# Patient Record
Sex: Female | Born: 1935 | Hispanic: No | Smoking: Never smoker
Health system: Southern US, Community
[De-identification: ages and names within clinical notes are randomized; demographics above are authoritative.]

## PROBLEM LIST (undated history)

## (undated) DIAGNOSIS — J449 Chronic obstructive pulmonary disease, unspecified: Secondary | ICD-10-CM

## (undated) DIAGNOSIS — F0390 Unspecified dementia without behavioral disturbance: Secondary | ICD-10-CM

## (undated) DIAGNOSIS — M4646 Discitis, unspecified, lumbar region: Secondary | ICD-10-CM

## (undated) DIAGNOSIS — K5900 Constipation, unspecified: Secondary | ICD-10-CM

## (undated) DIAGNOSIS — E871 Hypo-osmolality and hyponatremia: Secondary | ICD-10-CM

## (undated) DIAGNOSIS — K219 Gastro-esophageal reflux disease without esophagitis: Secondary | ICD-10-CM

## (undated) DIAGNOSIS — E039 Hypothyroidism, unspecified: Secondary | ICD-10-CM

## (undated) DIAGNOSIS — I10 Essential (primary) hypertension: Secondary | ICD-10-CM

## (undated) DIAGNOSIS — I509 Heart failure, unspecified: Secondary | ICD-10-CM

## (undated) DIAGNOSIS — R41841 Cognitive communication deficit: Secondary | ICD-10-CM

## (undated) DIAGNOSIS — D649 Anemia, unspecified: Secondary | ICD-10-CM

## (undated) DIAGNOSIS — J309 Allergic rhinitis, unspecified: Secondary | ICD-10-CM

## (undated) DIAGNOSIS — Z86711 Personal history of pulmonary embolism: Secondary | ICD-10-CM

## (undated) DIAGNOSIS — E119 Type 2 diabetes mellitus without complications: Secondary | ICD-10-CM

## (undated) DIAGNOSIS — Z950 Presence of cardiac pacemaker: Secondary | ICD-10-CM

## (undated) DIAGNOSIS — F419 Anxiety disorder, unspecified: Secondary | ICD-10-CM

## (undated) DIAGNOSIS — I5032 Chronic diastolic (congestive) heart failure: Secondary | ICD-10-CM

## (undated) DIAGNOSIS — E1142 Type 2 diabetes mellitus with diabetic polyneuropathy: Secondary | ICD-10-CM

## (undated) HISTORY — DX: Type 2 diabetes mellitus with diabetic polyneuropathy: E11.42

## (undated) HISTORY — DX: Hypo-osmolality and hyponatremia: E87.1

## (undated) HISTORY — DX: Discitis, unspecified, lumbar region: M46.46

## (undated) HISTORY — DX: Personal history of pulmonary embolism: Z86.711

## (undated) HISTORY — DX: Unspecified dementia, unspecified severity, without behavioral disturbance, psychotic disturbance, mood disturbance, and anxiety: F03.90

## (undated) HISTORY — DX: Anemia, unspecified: D64.9

## (undated) HISTORY — DX: Allergic rhinitis, unspecified: J30.9

## (undated) HISTORY — PX: REPLACEMENT TOTAL KNEE BILATERAL: SUR1225

## (undated) HISTORY — DX: Constipation, unspecified: K59.00

## (undated) HISTORY — DX: Cognitive communication deficit: R41.841

## (undated) HISTORY — DX: Anxiety disorder, unspecified: F41.9

## (undated) HISTORY — DX: Chronic diastolic (congestive) heart failure: I50.32

---

## 1998-06-28 ENCOUNTER — Ambulatory Visit (HOSPITAL_COMMUNITY): Admission: RE | Admit: 1998-06-28 | Discharge: 1998-06-28 | Payer: Self-pay | Admitting: Obstetrics & Gynecology

## 2011-05-31 DIAGNOSIS — M549 Dorsalgia, unspecified: Secondary | ICD-10-CM | POA: Diagnosis not present

## 2011-05-31 DIAGNOSIS — N3000 Acute cystitis without hematuria: Secondary | ICD-10-CM | POA: Diagnosis not present

## 2011-05-31 DIAGNOSIS — N390 Urinary tract infection, site not specified: Secondary | ICD-10-CM | POA: Diagnosis not present

## 2011-06-01 DIAGNOSIS — R109 Unspecified abdominal pain: Secondary | ICD-10-CM | POA: Diagnosis not present

## 2011-06-01 DIAGNOSIS — M545 Low back pain: Secondary | ICD-10-CM | POA: Diagnosis not present

## 2011-06-12 DIAGNOSIS — R109 Unspecified abdominal pain: Secondary | ICD-10-CM | POA: Diagnosis not present

## 2011-07-24 DIAGNOSIS — M25559 Pain in unspecified hip: Secondary | ICD-10-CM | POA: Diagnosis not present

## 2011-07-24 DIAGNOSIS — M76899 Other specified enthesopathies of unspecified lower limb, excluding foot: Secondary | ICD-10-CM | POA: Diagnosis not present

## 2011-08-23 DIAGNOSIS — I10 Essential (primary) hypertension: Secondary | ICD-10-CM | POA: Diagnosis not present

## 2011-08-23 DIAGNOSIS — E039 Hypothyroidism, unspecified: Secondary | ICD-10-CM | POA: Diagnosis not present

## 2011-08-23 DIAGNOSIS — N3941 Urge incontinence: Secondary | ICD-10-CM | POA: Diagnosis not present

## 2011-08-23 DIAGNOSIS — Z79899 Other long term (current) drug therapy: Secondary | ICD-10-CM | POA: Diagnosis not present

## 2011-08-23 DIAGNOSIS — E7800 Pure hypercholesterolemia, unspecified: Secondary | ICD-10-CM | POA: Diagnosis not present

## 2011-08-23 DIAGNOSIS — R7301 Impaired fasting glucose: Secondary | ICD-10-CM | POA: Diagnosis not present

## 2011-09-07 DIAGNOSIS — R5381 Other malaise: Secondary | ICD-10-CM | POA: Diagnosis not present

## 2011-09-07 DIAGNOSIS — I959 Hypotension, unspecified: Secondary | ICD-10-CM | POA: Diagnosis not present

## 2011-09-07 DIAGNOSIS — R5383 Other fatigue: Secondary | ICD-10-CM | POA: Diagnosis not present

## 2011-09-21 DIAGNOSIS — I10 Essential (primary) hypertension: Secondary | ICD-10-CM | POA: Diagnosis not present

## 2011-09-21 DIAGNOSIS — IMO0001 Reserved for inherently not codable concepts without codable children: Secondary | ICD-10-CM | POA: Diagnosis not present

## 2011-09-21 DIAGNOSIS — R5381 Other malaise: Secondary | ICD-10-CM | POA: Diagnosis not present

## 2011-10-05 DIAGNOSIS — M2550 Pain in unspecified joint: Secondary | ICD-10-CM | POA: Diagnosis not present

## 2011-10-21 DIAGNOSIS — M25559 Pain in unspecified hip: Secondary | ICD-10-CM | POA: Diagnosis not present

## 2011-11-06 DIAGNOSIS — G609 Hereditary and idiopathic neuropathy, unspecified: Secondary | ICD-10-CM | POA: Diagnosis not present

## 2011-11-06 DIAGNOSIS — M2550 Pain in unspecified joint: Secondary | ICD-10-CM | POA: Diagnosis not present

## 2011-11-13 DIAGNOSIS — R5381 Other malaise: Secondary | ICD-10-CM | POA: Diagnosis not present

## 2011-11-13 DIAGNOSIS — R350 Frequency of micturition: Secondary | ICD-10-CM | POA: Diagnosis not present

## 2011-11-13 DIAGNOSIS — R42 Dizziness and giddiness: Secondary | ICD-10-CM | POA: Diagnosis not present

## 2011-11-13 DIAGNOSIS — I10 Essential (primary) hypertension: Secondary | ICD-10-CM | POA: Diagnosis not present

## 2011-11-17 DIAGNOSIS — E7800 Pure hypercholesterolemia, unspecified: Secondary | ICD-10-CM | POA: Diagnosis not present

## 2011-11-17 DIAGNOSIS — R5381 Other malaise: Secondary | ICD-10-CM | POA: Diagnosis not present

## 2011-11-17 DIAGNOSIS — R51 Headache: Secondary | ICD-10-CM | POA: Diagnosis not present

## 2011-11-17 DIAGNOSIS — R52 Pain, unspecified: Secondary | ICD-10-CM | POA: Diagnosis not present

## 2011-11-17 DIAGNOSIS — Z79899 Other long term (current) drug therapy: Secondary | ICD-10-CM | POA: Diagnosis not present

## 2011-11-17 DIAGNOSIS — I10 Essential (primary) hypertension: Secondary | ICD-10-CM | POA: Diagnosis not present

## 2011-11-20 DIAGNOSIS — R5381 Other malaise: Secondary | ICD-10-CM | POA: Diagnosis not present

## 2011-11-20 DIAGNOSIS — G589 Mononeuropathy, unspecified: Secondary | ICD-10-CM | POA: Diagnosis not present

## 2011-11-20 DIAGNOSIS — R5383 Other fatigue: Secondary | ICD-10-CM | POA: Diagnosis not present

## 2011-11-21 DIAGNOSIS — IMO0001 Reserved for inherently not codable concepts without codable children: Secondary | ICD-10-CM | POA: Diagnosis not present

## 2011-11-21 DIAGNOSIS — R918 Other nonspecific abnormal finding of lung field: Secondary | ICD-10-CM | POA: Diagnosis not present

## 2011-11-21 DIAGNOSIS — D539 Nutritional anemia, unspecified: Secondary | ICD-10-CM | POA: Diagnosis not present

## 2011-11-21 DIAGNOSIS — G5790 Unspecified mononeuropathy of unspecified lower limb: Secondary | ICD-10-CM | POA: Diagnosis not present

## 2011-12-01 DIAGNOSIS — G5790 Unspecified mononeuropathy of unspecified lower limb: Secondary | ICD-10-CM | POA: Diagnosis not present

## 2011-12-15 DIAGNOSIS — F411 Generalized anxiety disorder: Secondary | ICD-10-CM | POA: Diagnosis not present

## 2011-12-15 DIAGNOSIS — G5790 Unspecified mononeuropathy of unspecified lower limb: Secondary | ICD-10-CM | POA: Diagnosis not present

## 2011-12-15 DIAGNOSIS — M1610 Unilateral primary osteoarthritis, unspecified hip: Secondary | ICD-10-CM | POA: Diagnosis not present

## 2012-01-05 DIAGNOSIS — G5790 Unspecified mononeuropathy of unspecified lower limb: Secondary | ICD-10-CM | POA: Diagnosis not present

## 2012-01-05 DIAGNOSIS — M5430 Sciatica, unspecified side: Secondary | ICD-10-CM | POA: Diagnosis not present

## 2012-01-05 DIAGNOSIS — Z23 Encounter for immunization: Secondary | ICD-10-CM | POA: Diagnosis not present

## 2012-01-05 DIAGNOSIS — M1610 Unilateral primary osteoarthritis, unspecified hip: Secondary | ICD-10-CM | POA: Diagnosis not present

## 2012-01-18 DIAGNOSIS — H8110 Benign paroxysmal vertigo, unspecified ear: Secondary | ICD-10-CM | POA: Diagnosis not present

## 2012-01-18 DIAGNOSIS — G5790 Unspecified mononeuropathy of unspecified lower limb: Secondary | ICD-10-CM | POA: Diagnosis not present

## 2012-01-26 DIAGNOSIS — G5790 Unspecified mononeuropathy of unspecified lower limb: Secondary | ICD-10-CM | POA: Diagnosis not present

## 2012-01-30 DIAGNOSIS — Z1231 Encounter for screening mammogram for malignant neoplasm of breast: Secondary | ICD-10-CM | POA: Diagnosis not present

## 2012-02-06 DIAGNOSIS — B079 Viral wart, unspecified: Secondary | ICD-10-CM | POA: Diagnosis not present

## 2012-02-06 DIAGNOSIS — M2550 Pain in unspecified joint: Secondary | ICD-10-CM | POA: Diagnosis not present

## 2012-02-26 DIAGNOSIS — G5790 Unspecified mononeuropathy of unspecified lower limb: Secondary | ICD-10-CM | POA: Diagnosis not present

## 2012-02-26 DIAGNOSIS — R42 Dizziness and giddiness: Secondary | ICD-10-CM | POA: Diagnosis not present

## 2012-03-04 DIAGNOSIS — E7800 Pure hypercholesterolemia, unspecified: Secondary | ICD-10-CM | POA: Diagnosis not present

## 2012-03-04 DIAGNOSIS — R1032 Left lower quadrant pain: Secondary | ICD-10-CM | POA: Diagnosis not present

## 2012-03-04 DIAGNOSIS — K4090 Unilateral inguinal hernia, without obstruction or gangrene, not specified as recurrent: Secondary | ICD-10-CM | POA: Diagnosis not present

## 2012-03-04 DIAGNOSIS — I10 Essential (primary) hypertension: Secondary | ICD-10-CM | POA: Diagnosis not present

## 2012-03-04 DIAGNOSIS — N390 Urinary tract infection, site not specified: Secondary | ICD-10-CM | POA: Diagnosis not present

## 2012-03-04 DIAGNOSIS — A499 Bacterial infection, unspecified: Secondary | ICD-10-CM | POA: Diagnosis not present

## 2012-03-04 DIAGNOSIS — IMO0002 Reserved for concepts with insufficient information to code with codable children: Secondary | ICD-10-CM | POA: Diagnosis not present

## 2012-03-04 DIAGNOSIS — K219 Gastro-esophageal reflux disease without esophagitis: Secondary | ICD-10-CM | POA: Diagnosis not present

## 2012-03-07 DIAGNOSIS — N390 Urinary tract infection, site not specified: Secondary | ICD-10-CM | POA: Diagnosis not present

## 2012-03-07 DIAGNOSIS — M25559 Pain in unspecified hip: Secondary | ICD-10-CM | POA: Diagnosis not present

## 2012-03-11 DIAGNOSIS — R1032 Left lower quadrant pain: Secondary | ICD-10-CM | POA: Diagnosis not present

## 2012-03-14 DIAGNOSIS — M1610 Unilateral primary osteoarthritis, unspecified hip: Secondary | ICD-10-CM | POA: Diagnosis not present

## 2012-03-14 DIAGNOSIS — M25559 Pain in unspecified hip: Secondary | ICD-10-CM | POA: Diagnosis not present

## 2012-03-28 DIAGNOSIS — I495 Sick sinus syndrome: Secondary | ICD-10-CM | POA: Diagnosis not present

## 2012-03-28 DIAGNOSIS — R112 Nausea with vomiting, unspecified: Secondary | ICD-10-CM | POA: Diagnosis not present

## 2012-03-28 DIAGNOSIS — R0789 Other chest pain: Secondary | ICD-10-CM | POA: Diagnosis not present

## 2012-03-28 DIAGNOSIS — I10 Essential (primary) hypertension: Secondary | ICD-10-CM | POA: Diagnosis not present

## 2012-03-28 DIAGNOSIS — N390 Urinary tract infection, site not specified: Secondary | ICD-10-CM | POA: Diagnosis not present

## 2012-03-28 DIAGNOSIS — Z79899 Other long term (current) drug therapy: Secondary | ICD-10-CM | POA: Diagnosis not present

## 2012-03-28 DIAGNOSIS — E785 Hyperlipidemia, unspecified: Secondary | ICD-10-CM | POA: Diagnosis not present

## 2012-03-28 DIAGNOSIS — E039 Hypothyroidism, unspecified: Secondary | ICD-10-CM | POA: Diagnosis not present

## 2012-03-28 DIAGNOSIS — I209 Angina pectoris, unspecified: Secondary | ICD-10-CM | POA: Diagnosis not present

## 2012-03-28 DIAGNOSIS — G589 Mononeuropathy, unspecified: Secondary | ICD-10-CM | POA: Diagnosis not present

## 2012-03-28 DIAGNOSIS — K219 Gastro-esophageal reflux disease without esophagitis: Secondary | ICD-10-CM | POA: Diagnosis not present

## 2012-03-28 DIAGNOSIS — R110 Nausea: Secondary | ICD-10-CM | POA: Diagnosis not present

## 2012-03-28 DIAGNOSIS — Z23 Encounter for immunization: Secondary | ICD-10-CM | POA: Diagnosis not present

## 2012-03-28 DIAGNOSIS — R079 Chest pain, unspecified: Secondary | ICD-10-CM | POA: Diagnosis not present

## 2012-03-28 DIAGNOSIS — M1990 Unspecified osteoarthritis, unspecified site: Secondary | ICD-10-CM | POA: Diagnosis not present

## 2012-03-28 DIAGNOSIS — R5383 Other fatigue: Secondary | ICD-10-CM | POA: Diagnosis not present

## 2012-03-28 DIAGNOSIS — Z7982 Long term (current) use of aspirin: Secondary | ICD-10-CM | POA: Diagnosis not present

## 2012-03-28 DIAGNOSIS — E669 Obesity, unspecified: Secondary | ICD-10-CM | POA: Diagnosis not present

## 2012-03-29 DIAGNOSIS — I495 Sick sinus syndrome: Secondary | ICD-10-CM | POA: Diagnosis not present

## 2012-03-29 DIAGNOSIS — I10 Essential (primary) hypertension: Secondary | ICD-10-CM | POA: Diagnosis not present

## 2012-03-29 DIAGNOSIS — R079 Chest pain, unspecified: Secondary | ICD-10-CM | POA: Diagnosis not present

## 2012-03-29 DIAGNOSIS — E785 Hyperlipidemia, unspecified: Secondary | ICD-10-CM | POA: Diagnosis not present

## 2012-03-29 DIAGNOSIS — R112 Nausea with vomiting, unspecified: Secondary | ICD-10-CM | POA: Diagnosis not present

## 2012-03-29 DIAGNOSIS — R0789 Other chest pain: Secondary | ICD-10-CM | POA: Diagnosis not present

## 2012-04-01 DIAGNOSIS — G5790 Unspecified mononeuropathy of unspecified lower limb: Secondary | ICD-10-CM | POA: Diagnosis not present

## 2012-04-01 DIAGNOSIS — I10 Essential (primary) hypertension: Secondary | ICD-10-CM | POA: Diagnosis not present

## 2012-04-01 DIAGNOSIS — R0789 Other chest pain: Secondary | ICD-10-CM | POA: Diagnosis not present

## 2012-08-23 DIAGNOSIS — Z0000 Encounter for general adult medical examination without abnormal findings: Secondary | ICD-10-CM | POA: Diagnosis not present

## 2012-08-23 DIAGNOSIS — K219 Gastro-esophageal reflux disease without esophagitis: Secondary | ICD-10-CM | POA: Diagnosis not present

## 2012-08-23 DIAGNOSIS — E039 Hypothyroidism, unspecified: Secondary | ICD-10-CM | POA: Diagnosis not present

## 2012-08-23 DIAGNOSIS — E7800 Pure hypercholesterolemia, unspecified: Secondary | ICD-10-CM | POA: Diagnosis not present

## 2012-08-23 DIAGNOSIS — I10 Essential (primary) hypertension: Secondary | ICD-10-CM | POA: Diagnosis not present

## 2012-08-23 DIAGNOSIS — R062 Wheezing: Secondary | ICD-10-CM | POA: Diagnosis not present

## 2012-08-27 DIAGNOSIS — J069 Acute upper respiratory infection, unspecified: Secondary | ICD-10-CM | POA: Diagnosis not present

## 2012-12-17 DIAGNOSIS — H2510 Age-related nuclear cataract, unspecified eye: Secondary | ICD-10-CM | POA: Diagnosis not present

## 2013-01-07 DIAGNOSIS — H04129 Dry eye syndrome of unspecified lacrimal gland: Secondary | ICD-10-CM | POA: Diagnosis not present

## 2013-01-07 DIAGNOSIS — H2510 Age-related nuclear cataract, unspecified eye: Secondary | ICD-10-CM | POA: Diagnosis not present

## 2013-01-28 DIAGNOSIS — H2510 Age-related nuclear cataract, unspecified eye: Secondary | ICD-10-CM | POA: Diagnosis not present

## 2013-01-28 DIAGNOSIS — H269 Unspecified cataract: Secondary | ICD-10-CM | POA: Diagnosis not present

## 2013-02-11 DIAGNOSIS — H259 Unspecified age-related cataract: Secondary | ICD-10-CM | POA: Diagnosis not present

## 2013-02-11 DIAGNOSIS — I10 Essential (primary) hypertension: Secondary | ICD-10-CM | POA: Diagnosis not present

## 2013-02-11 DIAGNOSIS — H2510 Age-related nuclear cataract, unspecified eye: Secondary | ICD-10-CM | POA: Diagnosis not present

## 2013-02-11 DIAGNOSIS — H268 Other specified cataract: Secondary | ICD-10-CM | POA: Diagnosis not present

## 2013-02-18 DIAGNOSIS — H04129 Dry eye syndrome of unspecified lacrimal gland: Secondary | ICD-10-CM | POA: Diagnosis not present

## 2013-04-24 DIAGNOSIS — E7800 Pure hypercholesterolemia, unspecified: Secondary | ICD-10-CM | POA: Diagnosis not present

## 2013-04-24 DIAGNOSIS — Z23 Encounter for immunization: Secondary | ICD-10-CM | POA: Diagnosis not present

## 2013-04-24 DIAGNOSIS — R7301 Impaired fasting glucose: Secondary | ICD-10-CM | POA: Diagnosis not present

## 2013-05-06 DIAGNOSIS — I10 Essential (primary) hypertension: Secondary | ICD-10-CM | POA: Diagnosis not present

## 2013-05-06 DIAGNOSIS — K219 Gastro-esophageal reflux disease without esophagitis: Secondary | ICD-10-CM | POA: Diagnosis not present

## 2013-05-06 DIAGNOSIS — M1990 Unspecified osteoarthritis, unspecified site: Secondary | ICD-10-CM | POA: Diagnosis not present

## 2013-05-06 DIAGNOSIS — E7800 Pure hypercholesterolemia, unspecified: Secondary | ICD-10-CM | POA: Diagnosis not present

## 2013-05-06 DIAGNOSIS — Z79899 Other long term (current) drug therapy: Secondary | ICD-10-CM | POA: Diagnosis not present

## 2013-05-06 DIAGNOSIS — E039 Hypothyroidism, unspecified: Secondary | ICD-10-CM | POA: Diagnosis not present

## 2013-05-07 DIAGNOSIS — I10 Essential (primary) hypertension: Secondary | ICD-10-CM | POA: Diagnosis not present

## 2013-05-07 DIAGNOSIS — E039 Hypothyroidism, unspecified: Secondary | ICD-10-CM | POA: Diagnosis not present

## 2013-05-07 DIAGNOSIS — E7800 Pure hypercholesterolemia, unspecified: Secondary | ICD-10-CM | POA: Diagnosis not present

## 2013-05-13 DIAGNOSIS — J209 Acute bronchitis, unspecified: Secondary | ICD-10-CM | POA: Diagnosis not present

## 2013-05-26 DIAGNOSIS — R5381 Other malaise: Secondary | ICD-10-CM | POA: Diagnosis not present

## 2013-05-26 DIAGNOSIS — Z79899 Other long term (current) drug therapy: Secondary | ICD-10-CM | POA: Diagnosis not present

## 2013-05-26 DIAGNOSIS — R5383 Other fatigue: Secondary | ICD-10-CM | POA: Diagnosis not present

## 2013-08-10 DIAGNOSIS — I10 Essential (primary) hypertension: Secondary | ICD-10-CM | POA: Diagnosis not present

## 2013-08-10 DIAGNOSIS — R071 Chest pain on breathing: Secondary | ICD-10-CM | POA: Diagnosis not present

## 2013-08-10 DIAGNOSIS — R5383 Other fatigue: Secondary | ICD-10-CM | POA: Diagnosis not present

## 2013-08-10 DIAGNOSIS — R110 Nausea: Secondary | ICD-10-CM | POA: Diagnosis not present

## 2013-08-10 DIAGNOSIS — R05 Cough: Secondary | ICD-10-CM | POA: Diagnosis not present

## 2013-08-10 DIAGNOSIS — Z79899 Other long term (current) drug therapy: Secondary | ICD-10-CM | POA: Diagnosis not present

## 2013-08-10 DIAGNOSIS — R079 Chest pain, unspecified: Secondary | ICD-10-CM | POA: Diagnosis not present

## 2013-08-10 DIAGNOSIS — E039 Hypothyroidism, unspecified: Secondary | ICD-10-CM | POA: Diagnosis not present

## 2013-08-10 DIAGNOSIS — R059 Cough, unspecified: Secondary | ICD-10-CM | POA: Diagnosis not present

## 2013-08-10 DIAGNOSIS — R5381 Other malaise: Secondary | ICD-10-CM | POA: Diagnosis not present

## 2013-08-11 DIAGNOSIS — R5381 Other malaise: Secondary | ICD-10-CM | POA: Diagnosis not present

## 2013-08-11 DIAGNOSIS — I10 Essential (primary) hypertension: Secondary | ICD-10-CM | POA: Diagnosis not present

## 2013-08-11 DIAGNOSIS — R5383 Other fatigue: Secondary | ICD-10-CM | POA: Diagnosis not present

## 2013-08-11 DIAGNOSIS — R0789 Other chest pain: Secondary | ICD-10-CM | POA: Diagnosis not present

## 2013-08-13 DIAGNOSIS — I10 Essential (primary) hypertension: Secondary | ICD-10-CM | POA: Diagnosis not present

## 2013-08-13 DIAGNOSIS — R0602 Shortness of breath: Secondary | ICD-10-CM | POA: Diagnosis not present

## 2013-08-13 DIAGNOSIS — R5383 Other fatigue: Secondary | ICD-10-CM | POA: Diagnosis not present

## 2013-08-13 DIAGNOSIS — R5381 Other malaise: Secondary | ICD-10-CM | POA: Diagnosis not present

## 2013-08-13 DIAGNOSIS — R079 Chest pain, unspecified: Secondary | ICD-10-CM | POA: Diagnosis not present

## 2013-08-14 DIAGNOSIS — I10 Essential (primary) hypertension: Secondary | ICD-10-CM | POA: Diagnosis not present

## 2013-08-14 DIAGNOSIS — R079 Chest pain, unspecified: Secondary | ICD-10-CM | POA: Diagnosis not present

## 2013-08-25 DIAGNOSIS — F411 Generalized anxiety disorder: Secondary | ICD-10-CM | POA: Diagnosis not present

## 2013-08-25 DIAGNOSIS — Z0000 Encounter for general adult medical examination without abnormal findings: Secondary | ICD-10-CM | POA: Diagnosis not present

## 2013-08-25 DIAGNOSIS — I10 Essential (primary) hypertension: Secondary | ICD-10-CM | POA: Diagnosis not present

## 2013-08-25 DIAGNOSIS — E039 Hypothyroidism, unspecified: Secondary | ICD-10-CM | POA: Diagnosis not present

## 2013-08-25 DIAGNOSIS — K219 Gastro-esophageal reflux disease without esophagitis: Secondary | ICD-10-CM | POA: Diagnosis not present

## 2013-09-04 DIAGNOSIS — R079 Chest pain, unspecified: Secondary | ICD-10-CM | POA: Diagnosis not present

## 2013-10-20 DIAGNOSIS — M1990 Unspecified osteoarthritis, unspecified site: Secondary | ICD-10-CM | POA: Diagnosis not present

## 2013-10-20 DIAGNOSIS — R0989 Other specified symptoms and signs involving the circulatory and respiratory systems: Secondary | ICD-10-CM | POA: Diagnosis not present

## 2013-10-20 DIAGNOSIS — R0609 Other forms of dyspnea: Secondary | ICD-10-CM | POA: Diagnosis not present

## 2013-10-20 DIAGNOSIS — F411 Generalized anxiety disorder: Secondary | ICD-10-CM | POA: Diagnosis not present

## 2014-02-27 DIAGNOSIS — Z23 Encounter for immunization: Secondary | ICD-10-CM | POA: Diagnosis not present

## 2014-04-23 DIAGNOSIS — Z7982 Long term (current) use of aspirin: Secondary | ICD-10-CM | POA: Diagnosis not present

## 2014-04-23 DIAGNOSIS — E039 Hypothyroidism, unspecified: Secondary | ICD-10-CM | POA: Diagnosis not present

## 2014-04-23 DIAGNOSIS — R079 Chest pain, unspecified: Secondary | ICD-10-CM | POA: Diagnosis not present

## 2014-04-23 DIAGNOSIS — I509 Heart failure, unspecified: Secondary | ICD-10-CM | POA: Diagnosis not present

## 2014-04-23 DIAGNOSIS — I10 Essential (primary) hypertension: Secondary | ICD-10-CM | POA: Diagnosis not present

## 2014-04-23 DIAGNOSIS — R0789 Other chest pain: Secondary | ICD-10-CM | POA: Diagnosis not present

## 2014-04-23 DIAGNOSIS — E780 Pure hypercholesterolemia: Secondary | ICD-10-CM | POA: Diagnosis not present

## 2014-05-06 DIAGNOSIS — N10 Acute tubulo-interstitial nephritis: Secondary | ICD-10-CM | POA: Diagnosis not present

## 2014-07-30 DIAGNOSIS — M545 Low back pain: Secondary | ICD-10-CM | POA: Diagnosis not present

## 2014-08-10 DIAGNOSIS — M5136 Other intervertebral disc degeneration, lumbar region: Secondary | ICD-10-CM | POA: Diagnosis not present

## 2014-08-10 DIAGNOSIS — M47816 Spondylosis without myelopathy or radiculopathy, lumbar region: Secondary | ICD-10-CM | POA: Diagnosis not present

## 2014-08-10 DIAGNOSIS — M545 Low back pain: Secondary | ICD-10-CM | POA: Diagnosis not present

## 2014-08-10 DIAGNOSIS — M47896 Other spondylosis, lumbar region: Secondary | ICD-10-CM | POA: Diagnosis not present

## 2014-08-13 DIAGNOSIS — M9903 Segmental and somatic dysfunction of lumbar region: Secondary | ICD-10-CM | POA: Diagnosis not present

## 2014-08-13 DIAGNOSIS — S338XXA Sprain of other parts of lumbar spine and pelvis, initial encounter: Secondary | ICD-10-CM | POA: Diagnosis not present

## 2014-08-18 DIAGNOSIS — S338XXA Sprain of other parts of lumbar spine and pelvis, initial encounter: Secondary | ICD-10-CM | POA: Diagnosis not present

## 2014-08-18 DIAGNOSIS — M9903 Segmental and somatic dysfunction of lumbar region: Secondary | ICD-10-CM | POA: Diagnosis not present

## 2014-08-19 DIAGNOSIS — M9903 Segmental and somatic dysfunction of lumbar region: Secondary | ICD-10-CM | POA: Diagnosis not present

## 2014-08-19 DIAGNOSIS — S338XXA Sprain of other parts of lumbar spine and pelvis, initial encounter: Secondary | ICD-10-CM | POA: Diagnosis not present

## 2014-08-20 DIAGNOSIS — S338XXA Sprain of other parts of lumbar spine and pelvis, initial encounter: Secondary | ICD-10-CM | POA: Diagnosis not present

## 2014-08-20 DIAGNOSIS — M9903 Segmental and somatic dysfunction of lumbar region: Secondary | ICD-10-CM | POA: Diagnosis not present

## 2014-08-24 DIAGNOSIS — S338XXA Sprain of other parts of lumbar spine and pelvis, initial encounter: Secondary | ICD-10-CM | POA: Diagnosis not present

## 2014-08-24 DIAGNOSIS — M9903 Segmental and somatic dysfunction of lumbar region: Secondary | ICD-10-CM | POA: Diagnosis not present

## 2014-08-25 DIAGNOSIS — M9903 Segmental and somatic dysfunction of lumbar region: Secondary | ICD-10-CM | POA: Diagnosis not present

## 2014-08-25 DIAGNOSIS — S338XXA Sprain of other parts of lumbar spine and pelvis, initial encounter: Secondary | ICD-10-CM | POA: Diagnosis not present

## 2014-08-27 DIAGNOSIS — K760 Fatty (change of) liver, not elsewhere classified: Secondary | ICD-10-CM | POA: Diagnosis not present

## 2014-08-27 DIAGNOSIS — S338XXA Sprain of other parts of lumbar spine and pelvis, initial encounter: Secondary | ICD-10-CM | POA: Diagnosis not present

## 2014-08-27 DIAGNOSIS — Z0000 Encounter for general adult medical examination without abnormal findings: Secondary | ICD-10-CM | POA: Diagnosis not present

## 2014-08-27 DIAGNOSIS — E039 Hypothyroidism, unspecified: Secondary | ICD-10-CM | POA: Diagnosis not present

## 2014-08-27 DIAGNOSIS — E780 Pure hypercholesterolemia: Secondary | ICD-10-CM | POA: Diagnosis not present

## 2014-08-27 DIAGNOSIS — Z23 Encounter for immunization: Secondary | ICD-10-CM | POA: Diagnosis not present

## 2014-08-27 DIAGNOSIS — I447 Left bundle-branch block, unspecified: Secondary | ICD-10-CM | POA: Diagnosis not present

## 2014-08-27 DIAGNOSIS — Z79899 Other long term (current) drug therapy: Secondary | ICD-10-CM | POA: Diagnosis not present

## 2014-08-27 DIAGNOSIS — M9903 Segmental and somatic dysfunction of lumbar region: Secondary | ICD-10-CM | POA: Diagnosis not present

## 2014-08-31 DIAGNOSIS — S338XXA Sprain of other parts of lumbar spine and pelvis, initial encounter: Secondary | ICD-10-CM | POA: Diagnosis not present

## 2014-08-31 DIAGNOSIS — M9903 Segmental and somatic dysfunction of lumbar region: Secondary | ICD-10-CM | POA: Diagnosis not present

## 2014-09-01 DIAGNOSIS — S338XXA Sprain of other parts of lumbar spine and pelvis, initial encounter: Secondary | ICD-10-CM | POA: Diagnosis not present

## 2014-09-01 DIAGNOSIS — M9903 Segmental and somatic dysfunction of lumbar region: Secondary | ICD-10-CM | POA: Diagnosis not present

## 2014-09-03 DIAGNOSIS — M9903 Segmental and somatic dysfunction of lumbar region: Secondary | ICD-10-CM | POA: Diagnosis not present

## 2014-09-03 DIAGNOSIS — S338XXA Sprain of other parts of lumbar spine and pelvis, initial encounter: Secondary | ICD-10-CM | POA: Diagnosis not present

## 2014-09-07 DIAGNOSIS — M9903 Segmental and somatic dysfunction of lumbar region: Secondary | ICD-10-CM | POA: Diagnosis not present

## 2014-09-07 DIAGNOSIS — S338XXA Sprain of other parts of lumbar spine and pelvis, initial encounter: Secondary | ICD-10-CM | POA: Diagnosis not present

## 2014-09-08 DIAGNOSIS — S338XXA Sprain of other parts of lumbar spine and pelvis, initial encounter: Secondary | ICD-10-CM | POA: Diagnosis not present

## 2014-09-08 DIAGNOSIS — M9903 Segmental and somatic dysfunction of lumbar region: Secondary | ICD-10-CM | POA: Diagnosis not present

## 2014-09-10 DIAGNOSIS — M9903 Segmental and somatic dysfunction of lumbar region: Secondary | ICD-10-CM | POA: Diagnosis not present

## 2014-09-10 DIAGNOSIS — S338XXA Sprain of other parts of lumbar spine and pelvis, initial encounter: Secondary | ICD-10-CM | POA: Diagnosis not present

## 2014-09-14 DIAGNOSIS — S338XXA Sprain of other parts of lumbar spine and pelvis, initial encounter: Secondary | ICD-10-CM | POA: Diagnosis not present

## 2014-09-14 DIAGNOSIS — M9903 Segmental and somatic dysfunction of lumbar region: Secondary | ICD-10-CM | POA: Diagnosis not present

## 2014-09-17 DIAGNOSIS — S338XXA Sprain of other parts of lumbar spine and pelvis, initial encounter: Secondary | ICD-10-CM | POA: Diagnosis not present

## 2014-09-17 DIAGNOSIS — M9903 Segmental and somatic dysfunction of lumbar region: Secondary | ICD-10-CM | POA: Diagnosis not present

## 2014-09-18 DIAGNOSIS — N390 Urinary tract infection, site not specified: Secondary | ICD-10-CM | POA: Diagnosis not present

## 2014-09-18 DIAGNOSIS — J019 Acute sinusitis, unspecified: Secondary | ICD-10-CM | POA: Diagnosis not present

## 2014-09-18 DIAGNOSIS — R938 Abnormal findings on diagnostic imaging of other specified body structures: Secondary | ICD-10-CM | POA: Diagnosis not present

## 2014-09-18 DIAGNOSIS — J209 Acute bronchitis, unspecified: Secondary | ICD-10-CM | POA: Diagnosis not present

## 2014-09-21 DIAGNOSIS — M9903 Segmental and somatic dysfunction of lumbar region: Secondary | ICD-10-CM | POA: Diagnosis not present

## 2014-09-21 DIAGNOSIS — S338XXA Sprain of other parts of lumbar spine and pelvis, initial encounter: Secondary | ICD-10-CM | POA: Diagnosis not present

## 2014-09-24 DIAGNOSIS — S338XXA Sprain of other parts of lumbar spine and pelvis, initial encounter: Secondary | ICD-10-CM | POA: Diagnosis not present

## 2014-09-24 DIAGNOSIS — M9903 Segmental and somatic dysfunction of lumbar region: Secondary | ICD-10-CM | POA: Diagnosis not present

## 2014-09-28 DIAGNOSIS — S338XXA Sprain of other parts of lumbar spine and pelvis, initial encounter: Secondary | ICD-10-CM | POA: Diagnosis not present

## 2014-09-28 DIAGNOSIS — M9903 Segmental and somatic dysfunction of lumbar region: Secondary | ICD-10-CM | POA: Diagnosis not present

## 2014-10-01 DIAGNOSIS — M9903 Segmental and somatic dysfunction of lumbar region: Secondary | ICD-10-CM | POA: Diagnosis not present

## 2014-10-01 DIAGNOSIS — S338XXA Sprain of other parts of lumbar spine and pelvis, initial encounter: Secondary | ICD-10-CM | POA: Diagnosis not present

## 2014-10-08 DIAGNOSIS — M9903 Segmental and somatic dysfunction of lumbar region: Secondary | ICD-10-CM | POA: Diagnosis not present

## 2014-10-08 DIAGNOSIS — S338XXA Sprain of other parts of lumbar spine and pelvis, initial encounter: Secondary | ICD-10-CM | POA: Diagnosis not present

## 2014-10-15 DIAGNOSIS — M9903 Segmental and somatic dysfunction of lumbar region: Secondary | ICD-10-CM | POA: Diagnosis not present

## 2014-10-15 DIAGNOSIS — S338XXA Sprain of other parts of lumbar spine and pelvis, initial encounter: Secondary | ICD-10-CM | POA: Diagnosis not present

## 2014-10-29 DIAGNOSIS — S338XXA Sprain of other parts of lumbar spine and pelvis, initial encounter: Secondary | ICD-10-CM | POA: Diagnosis not present

## 2014-10-29 DIAGNOSIS — M9903 Segmental and somatic dysfunction of lumbar region: Secondary | ICD-10-CM | POA: Diagnosis not present

## 2014-11-04 DIAGNOSIS — R1110 Vomiting, unspecified: Secondary | ICD-10-CM | POA: Diagnosis not present

## 2014-11-10 DIAGNOSIS — M1990 Unspecified osteoarthritis, unspecified site: Secondary | ICD-10-CM | POA: Diagnosis not present

## 2014-11-18 DIAGNOSIS — R079 Chest pain, unspecified: Secondary | ICD-10-CM | POA: Diagnosis not present

## 2014-11-18 DIAGNOSIS — E039 Hypothyroidism, unspecified: Secondary | ICD-10-CM | POA: Diagnosis not present

## 2014-11-18 DIAGNOSIS — E780 Pure hypercholesterolemia: Secondary | ICD-10-CM | POA: Diagnosis not present

## 2014-11-18 DIAGNOSIS — I10 Essential (primary) hypertension: Secondary | ICD-10-CM | POA: Diagnosis not present

## 2014-11-25 DIAGNOSIS — M1611 Unilateral primary osteoarthritis, right hip: Secondary | ICD-10-CM | POA: Diagnosis not present

## 2014-11-25 DIAGNOSIS — M479 Spondylosis, unspecified: Secondary | ICD-10-CM | POA: Diagnosis not present

## 2014-12-08 DIAGNOSIS — M479 Spondylosis, unspecified: Secondary | ICD-10-CM | POA: Diagnosis not present

## 2014-12-14 DIAGNOSIS — M5126 Other intervertebral disc displacement, lumbar region: Secondary | ICD-10-CM | POA: Diagnosis not present

## 2014-12-18 DIAGNOSIS — R079 Chest pain, unspecified: Secondary | ICD-10-CM | POA: Diagnosis not present

## 2014-12-18 DIAGNOSIS — E780 Pure hypercholesterolemia: Secondary | ICD-10-CM | POA: Diagnosis not present

## 2014-12-18 DIAGNOSIS — E039 Hypothyroidism, unspecified: Secondary | ICD-10-CM | POA: Diagnosis not present

## 2014-12-18 DIAGNOSIS — I10 Essential (primary) hypertension: Secondary | ICD-10-CM | POA: Diagnosis not present

## 2014-12-31 DIAGNOSIS — M5126 Other intervertebral disc displacement, lumbar region: Secondary | ICD-10-CM | POA: Diagnosis not present

## 2014-12-31 DIAGNOSIS — M5416 Radiculopathy, lumbar region: Secondary | ICD-10-CM | POA: Insufficient documentation

## 2014-12-31 DIAGNOSIS — M51369 Other intervertebral disc degeneration, lumbar region without mention of lumbar back pain or lower extremity pain: Secondary | ICD-10-CM | POA: Insufficient documentation

## 2014-12-31 DIAGNOSIS — M5136 Other intervertebral disc degeneration, lumbar region: Secondary | ICD-10-CM

## 2014-12-31 HISTORY — DX: Other intervertebral disc degeneration, lumbar region: M51.36

## 2014-12-31 HISTORY — DX: Radiculopathy, lumbar region: M54.16

## 2015-01-18 DIAGNOSIS — R079 Chest pain, unspecified: Secondary | ICD-10-CM | POA: Diagnosis not present

## 2015-01-18 DIAGNOSIS — E780 Pure hypercholesterolemia: Secondary | ICD-10-CM | POA: Diagnosis not present

## 2015-01-18 DIAGNOSIS — I10 Essential (primary) hypertension: Secondary | ICD-10-CM | POA: Diagnosis not present

## 2015-01-18 DIAGNOSIS — E039 Hypothyroidism, unspecified: Secondary | ICD-10-CM | POA: Diagnosis not present

## 2015-01-28 DIAGNOSIS — M5416 Radiculopathy, lumbar region: Secondary | ICD-10-CM | POA: Diagnosis not present

## 2015-01-28 DIAGNOSIS — M5126 Other intervertebral disc displacement, lumbar region: Secondary | ICD-10-CM | POA: Diagnosis not present

## 2015-01-28 DIAGNOSIS — M5136 Other intervertebral disc degeneration, lumbar region: Secondary | ICD-10-CM | POA: Diagnosis not present

## 2015-01-28 DIAGNOSIS — M1611 Unilateral primary osteoarthritis, right hip: Secondary | ICD-10-CM | POA: Insufficient documentation

## 2015-01-28 DIAGNOSIS — M25551 Pain in right hip: Secondary | ICD-10-CM | POA: Diagnosis not present

## 2015-01-28 HISTORY — DX: Unilateral primary osteoarthritis, right hip: M16.11

## 2015-02-18 DIAGNOSIS — R05 Cough: Secondary | ICD-10-CM | POA: Diagnosis not present

## 2015-02-18 DIAGNOSIS — R0602 Shortness of breath: Secondary | ICD-10-CM | POA: Diagnosis not present

## 2015-02-18 DIAGNOSIS — J40 Bronchitis, not specified as acute or chronic: Secondary | ICD-10-CM | POA: Diagnosis not present

## 2015-02-18 DIAGNOSIS — R110 Nausea: Secondary | ICD-10-CM | POA: Diagnosis not present

## 2015-02-18 DIAGNOSIS — R0689 Other abnormalities of breathing: Secondary | ICD-10-CM | POA: Diagnosis not present

## 2015-02-18 DIAGNOSIS — J180 Bronchopneumonia, unspecified organism: Secondary | ICD-10-CM | POA: Diagnosis not present

## 2015-02-18 DIAGNOSIS — R531 Weakness: Secondary | ICD-10-CM | POA: Diagnosis not present

## 2015-02-18 DIAGNOSIS — E871 Hypo-osmolality and hyponatremia: Secondary | ICD-10-CM | POA: Diagnosis not present

## 2015-02-19 DIAGNOSIS — R05 Cough: Secondary | ICD-10-CM | POA: Diagnosis not present

## 2015-02-19 DIAGNOSIS — G629 Polyneuropathy, unspecified: Secondary | ICD-10-CM | POA: Diagnosis present

## 2015-02-19 DIAGNOSIS — J180 Bronchopneumonia, unspecified organism: Secondary | ICD-10-CM | POA: Diagnosis not present

## 2015-02-19 DIAGNOSIS — I509 Heart failure, unspecified: Secondary | ICD-10-CM | POA: Diagnosis not present

## 2015-02-19 DIAGNOSIS — I10 Essential (primary) hypertension: Secondary | ICD-10-CM | POA: Diagnosis not present

## 2015-02-19 DIAGNOSIS — J40 Bronchitis, not specified as acute or chronic: Secondary | ICD-10-CM | POA: Diagnosis not present

## 2015-02-19 DIAGNOSIS — K219 Gastro-esophageal reflux disease without esophagitis: Secondary | ICD-10-CM | POA: Diagnosis not present

## 2015-02-19 DIAGNOSIS — J80 Acute respiratory distress syndrome: Secondary | ICD-10-CM | POA: Diagnosis present

## 2015-02-19 DIAGNOSIS — Z7982 Long term (current) use of aspirin: Secondary | ICD-10-CM | POA: Diagnosis not present

## 2015-02-19 DIAGNOSIS — F419 Anxiety disorder, unspecified: Secondary | ICD-10-CM | POA: Diagnosis present

## 2015-02-19 DIAGNOSIS — R110 Nausea: Secondary | ICD-10-CM | POA: Diagnosis not present

## 2015-02-19 DIAGNOSIS — E871 Hypo-osmolality and hyponatremia: Secondary | ICD-10-CM | POA: Diagnosis not present

## 2015-02-19 DIAGNOSIS — Z79899 Other long term (current) drug therapy: Secondary | ICD-10-CM | POA: Diagnosis not present

## 2015-02-19 DIAGNOSIS — I5030 Unspecified diastolic (congestive) heart failure: Secondary | ICD-10-CM | POA: Diagnosis not present

## 2015-02-19 DIAGNOSIS — M25451 Effusion, right hip: Secondary | ICD-10-CM | POA: Diagnosis not present

## 2015-02-19 DIAGNOSIS — R0600 Dyspnea, unspecified: Secondary | ICD-10-CM | POA: Diagnosis not present

## 2015-02-19 DIAGNOSIS — R531 Weakness: Secondary | ICD-10-CM | POA: Diagnosis not present

## 2015-02-19 DIAGNOSIS — R0602 Shortness of breath: Secondary | ICD-10-CM | POA: Diagnosis not present

## 2015-02-19 DIAGNOSIS — E039 Hypothyroidism, unspecified: Secondary | ICD-10-CM | POA: Diagnosis present

## 2015-02-19 DIAGNOSIS — Z885 Allergy status to narcotic agent status: Secondary | ICD-10-CM | POA: Diagnosis not present

## 2015-02-19 DIAGNOSIS — I361 Nonrheumatic tricuspid (valve) insufficiency: Secondary | ICD-10-CM | POA: Diagnosis not present

## 2015-02-19 DIAGNOSIS — M1990 Unspecified osteoarthritis, unspecified site: Secondary | ICD-10-CM | POA: Diagnosis present

## 2015-02-19 DIAGNOSIS — R0689 Other abnormalities of breathing: Secondary | ICD-10-CM | POA: Diagnosis not present

## 2015-02-19 DIAGNOSIS — R918 Other nonspecific abnormal finding of lung field: Secondary | ICD-10-CM | POA: Diagnosis not present

## 2015-02-25 DIAGNOSIS — K219 Gastro-esophageal reflux disease without esophagitis: Secondary | ICD-10-CM | POA: Diagnosis not present

## 2015-02-25 DIAGNOSIS — Z9181 History of falling: Secondary | ICD-10-CM | POA: Diagnosis not present

## 2015-02-25 DIAGNOSIS — Z7982 Long term (current) use of aspirin: Secondary | ICD-10-CM | POA: Diagnosis not present

## 2015-02-25 DIAGNOSIS — J180 Bronchopneumonia, unspecified organism: Secondary | ICD-10-CM | POA: Diagnosis not present

## 2015-02-25 DIAGNOSIS — G629 Polyneuropathy, unspecified: Secondary | ICD-10-CM | POA: Diagnosis not present

## 2015-02-25 DIAGNOSIS — E785 Hyperlipidemia, unspecified: Secondary | ICD-10-CM | POA: Diagnosis not present

## 2015-02-25 DIAGNOSIS — E039 Hypothyroidism, unspecified: Secondary | ICD-10-CM | POA: Diagnosis not present

## 2015-02-25 DIAGNOSIS — I10 Essential (primary) hypertension: Secondary | ICD-10-CM | POA: Diagnosis not present

## 2015-02-25 DIAGNOSIS — Z96653 Presence of artificial knee joint, bilateral: Secondary | ICD-10-CM | POA: Diagnosis not present

## 2015-02-25 DIAGNOSIS — M1990 Unspecified osteoarthritis, unspecified site: Secondary | ICD-10-CM | POA: Diagnosis not present

## 2015-02-25 DIAGNOSIS — Z602 Problems related to living alone: Secondary | ICD-10-CM | POA: Diagnosis not present

## 2015-02-25 DIAGNOSIS — I5030 Unspecified diastolic (congestive) heart failure: Secondary | ICD-10-CM | POA: Diagnosis not present

## 2015-02-26 DIAGNOSIS — J180 Bronchopneumonia, unspecified organism: Secondary | ICD-10-CM | POA: Diagnosis not present

## 2015-02-26 DIAGNOSIS — I5030 Unspecified diastolic (congestive) heart failure: Secondary | ICD-10-CM | POA: Diagnosis not present

## 2015-02-26 DIAGNOSIS — M1990 Unspecified osteoarthritis, unspecified site: Secondary | ICD-10-CM | POA: Diagnosis not present

## 2015-02-26 DIAGNOSIS — I10 Essential (primary) hypertension: Secondary | ICD-10-CM | POA: Diagnosis not present

## 2015-02-26 DIAGNOSIS — E039 Hypothyroidism, unspecified: Secondary | ICD-10-CM | POA: Diagnosis not present

## 2015-02-26 DIAGNOSIS — G629 Polyneuropathy, unspecified: Secondary | ICD-10-CM | POA: Diagnosis not present

## 2015-03-01 DIAGNOSIS — I5030 Unspecified diastolic (congestive) heart failure: Secondary | ICD-10-CM | POA: Diagnosis not present

## 2015-03-01 DIAGNOSIS — E039 Hypothyroidism, unspecified: Secondary | ICD-10-CM | POA: Diagnosis not present

## 2015-03-01 DIAGNOSIS — J180 Bronchopneumonia, unspecified organism: Secondary | ICD-10-CM | POA: Diagnosis not present

## 2015-03-01 DIAGNOSIS — M1990 Unspecified osteoarthritis, unspecified site: Secondary | ICD-10-CM | POA: Diagnosis not present

## 2015-03-01 DIAGNOSIS — G629 Polyneuropathy, unspecified: Secondary | ICD-10-CM | POA: Diagnosis not present

## 2015-03-01 DIAGNOSIS — I10 Essential (primary) hypertension: Secondary | ICD-10-CM | POA: Diagnosis not present

## 2015-03-03 DIAGNOSIS — G629 Polyneuropathy, unspecified: Secondary | ICD-10-CM | POA: Diagnosis not present

## 2015-03-03 DIAGNOSIS — M1990 Unspecified osteoarthritis, unspecified site: Secondary | ICD-10-CM | POA: Diagnosis not present

## 2015-03-03 DIAGNOSIS — E039 Hypothyroidism, unspecified: Secondary | ICD-10-CM | POA: Diagnosis not present

## 2015-03-03 DIAGNOSIS — I5030 Unspecified diastolic (congestive) heart failure: Secondary | ICD-10-CM | POA: Diagnosis not present

## 2015-03-03 DIAGNOSIS — J180 Bronchopneumonia, unspecified organism: Secondary | ICD-10-CM | POA: Diagnosis not present

## 2015-03-03 DIAGNOSIS — I10 Essential (primary) hypertension: Secondary | ICD-10-CM | POA: Diagnosis not present

## 2015-03-04 DIAGNOSIS — I5030 Unspecified diastolic (congestive) heart failure: Secondary | ICD-10-CM | POA: Diagnosis not present

## 2015-03-04 DIAGNOSIS — J180 Bronchopneumonia, unspecified organism: Secondary | ICD-10-CM | POA: Diagnosis not present

## 2015-03-04 DIAGNOSIS — I509 Heart failure, unspecified: Secondary | ICD-10-CM | POA: Diagnosis not present

## 2015-03-04 DIAGNOSIS — I10 Essential (primary) hypertension: Secondary | ICD-10-CM | POA: Diagnosis not present

## 2015-03-04 DIAGNOSIS — Z23 Encounter for immunization: Secondary | ICD-10-CM | POA: Diagnosis not present

## 2015-03-05 DIAGNOSIS — I10 Essential (primary) hypertension: Secondary | ICD-10-CM | POA: Diagnosis not present

## 2015-03-05 DIAGNOSIS — G629 Polyneuropathy, unspecified: Secondary | ICD-10-CM | POA: Diagnosis not present

## 2015-03-05 DIAGNOSIS — M1990 Unspecified osteoarthritis, unspecified site: Secondary | ICD-10-CM | POA: Diagnosis not present

## 2015-03-05 DIAGNOSIS — I5030 Unspecified diastolic (congestive) heart failure: Secondary | ICD-10-CM | POA: Diagnosis not present

## 2015-03-05 DIAGNOSIS — E039 Hypothyroidism, unspecified: Secondary | ICD-10-CM | POA: Diagnosis not present

## 2015-03-05 DIAGNOSIS — J180 Bronchopneumonia, unspecified organism: Secondary | ICD-10-CM | POA: Diagnosis not present

## 2015-03-08 DIAGNOSIS — E039 Hypothyroidism, unspecified: Secondary | ICD-10-CM | POA: Diagnosis not present

## 2015-03-08 DIAGNOSIS — M1990 Unspecified osteoarthritis, unspecified site: Secondary | ICD-10-CM | POA: Diagnosis not present

## 2015-03-08 DIAGNOSIS — I10 Essential (primary) hypertension: Secondary | ICD-10-CM | POA: Diagnosis not present

## 2015-03-08 DIAGNOSIS — G629 Polyneuropathy, unspecified: Secondary | ICD-10-CM | POA: Diagnosis not present

## 2015-03-08 DIAGNOSIS — I5030 Unspecified diastolic (congestive) heart failure: Secondary | ICD-10-CM | POA: Diagnosis not present

## 2015-03-08 DIAGNOSIS — J180 Bronchopneumonia, unspecified organism: Secondary | ICD-10-CM | POA: Diagnosis not present

## 2015-03-09 DIAGNOSIS — M1990 Unspecified osteoarthritis, unspecified site: Secondary | ICD-10-CM | POA: Diagnosis not present

## 2015-03-09 DIAGNOSIS — G629 Polyneuropathy, unspecified: Secondary | ICD-10-CM | POA: Diagnosis not present

## 2015-03-09 DIAGNOSIS — I10 Essential (primary) hypertension: Secondary | ICD-10-CM | POA: Diagnosis not present

## 2015-03-09 DIAGNOSIS — I5030 Unspecified diastolic (congestive) heart failure: Secondary | ICD-10-CM | POA: Diagnosis not present

## 2015-03-09 DIAGNOSIS — J180 Bronchopneumonia, unspecified organism: Secondary | ICD-10-CM | POA: Diagnosis not present

## 2015-03-09 DIAGNOSIS — E039 Hypothyroidism, unspecified: Secondary | ICD-10-CM | POA: Diagnosis not present

## 2015-03-11 DIAGNOSIS — J180 Bronchopneumonia, unspecified organism: Secondary | ICD-10-CM | POA: Diagnosis not present

## 2015-03-11 DIAGNOSIS — I5030 Unspecified diastolic (congestive) heart failure: Secondary | ICD-10-CM | POA: Diagnosis not present

## 2015-03-11 DIAGNOSIS — G629 Polyneuropathy, unspecified: Secondary | ICD-10-CM | POA: Diagnosis not present

## 2015-03-11 DIAGNOSIS — I10 Essential (primary) hypertension: Secondary | ICD-10-CM | POA: Diagnosis not present

## 2015-03-11 DIAGNOSIS — E039 Hypothyroidism, unspecified: Secondary | ICD-10-CM | POA: Diagnosis not present

## 2015-03-11 DIAGNOSIS — M1990 Unspecified osteoarthritis, unspecified site: Secondary | ICD-10-CM | POA: Diagnosis not present

## 2015-03-12 DIAGNOSIS — I10 Essential (primary) hypertension: Secondary | ICD-10-CM | POA: Diagnosis not present

## 2015-03-12 DIAGNOSIS — E039 Hypothyroidism, unspecified: Secondary | ICD-10-CM | POA: Diagnosis not present

## 2015-03-12 DIAGNOSIS — I5030 Unspecified diastolic (congestive) heart failure: Secondary | ICD-10-CM | POA: Diagnosis not present

## 2015-03-12 DIAGNOSIS — G629 Polyneuropathy, unspecified: Secondary | ICD-10-CM | POA: Diagnosis not present

## 2015-03-12 DIAGNOSIS — J180 Bronchopneumonia, unspecified organism: Secondary | ICD-10-CM | POA: Diagnosis not present

## 2015-03-12 DIAGNOSIS — M1990 Unspecified osteoarthritis, unspecified site: Secondary | ICD-10-CM | POA: Diagnosis not present

## 2015-03-15 DIAGNOSIS — I5030 Unspecified diastolic (congestive) heart failure: Secondary | ICD-10-CM | POA: Diagnosis not present

## 2015-03-15 DIAGNOSIS — I10 Essential (primary) hypertension: Secondary | ICD-10-CM | POA: Diagnosis not present

## 2015-03-15 DIAGNOSIS — E871 Hypo-osmolality and hyponatremia: Secondary | ICD-10-CM | POA: Diagnosis not present

## 2015-03-16 DIAGNOSIS — G629 Polyneuropathy, unspecified: Secondary | ICD-10-CM | POA: Diagnosis not present

## 2015-03-16 DIAGNOSIS — I5030 Unspecified diastolic (congestive) heart failure: Secondary | ICD-10-CM | POA: Diagnosis not present

## 2015-03-16 DIAGNOSIS — E039 Hypothyroidism, unspecified: Secondary | ICD-10-CM | POA: Diagnosis not present

## 2015-03-16 DIAGNOSIS — I10 Essential (primary) hypertension: Secondary | ICD-10-CM | POA: Diagnosis not present

## 2015-03-16 DIAGNOSIS — M1990 Unspecified osteoarthritis, unspecified site: Secondary | ICD-10-CM | POA: Diagnosis not present

## 2015-03-16 DIAGNOSIS — J180 Bronchopneumonia, unspecified organism: Secondary | ICD-10-CM | POA: Diagnosis not present

## 2015-03-17 DIAGNOSIS — E039 Hypothyroidism, unspecified: Secondary | ICD-10-CM | POA: Diagnosis not present

## 2015-03-17 DIAGNOSIS — I5030 Unspecified diastolic (congestive) heart failure: Secondary | ICD-10-CM | POA: Diagnosis not present

## 2015-03-17 DIAGNOSIS — I10 Essential (primary) hypertension: Secondary | ICD-10-CM | POA: Diagnosis not present

## 2015-03-17 DIAGNOSIS — J180 Bronchopneumonia, unspecified organism: Secondary | ICD-10-CM | POA: Diagnosis not present

## 2015-03-17 DIAGNOSIS — M1990 Unspecified osteoarthritis, unspecified site: Secondary | ICD-10-CM | POA: Diagnosis not present

## 2015-03-17 DIAGNOSIS — G629 Polyneuropathy, unspecified: Secondary | ICD-10-CM | POA: Diagnosis not present

## 2015-03-18 DIAGNOSIS — I5030 Unspecified diastolic (congestive) heart failure: Secondary | ICD-10-CM | POA: Diagnosis not present

## 2015-03-18 DIAGNOSIS — M1990 Unspecified osteoarthritis, unspecified site: Secondary | ICD-10-CM | POA: Diagnosis not present

## 2015-03-18 DIAGNOSIS — J180 Bronchopneumonia, unspecified organism: Secondary | ICD-10-CM | POA: Diagnosis not present

## 2015-03-18 DIAGNOSIS — G629 Polyneuropathy, unspecified: Secondary | ICD-10-CM | POA: Diagnosis not present

## 2015-03-18 DIAGNOSIS — E039 Hypothyroidism, unspecified: Secondary | ICD-10-CM | POA: Diagnosis not present

## 2015-03-18 DIAGNOSIS — I10 Essential (primary) hypertension: Secondary | ICD-10-CM | POA: Diagnosis not present

## 2015-03-23 DIAGNOSIS — I10 Essential (primary) hypertension: Secondary | ICD-10-CM | POA: Diagnosis not present

## 2015-03-23 DIAGNOSIS — J180 Bronchopneumonia, unspecified organism: Secondary | ICD-10-CM | POA: Diagnosis not present

## 2015-03-23 DIAGNOSIS — M1990 Unspecified osteoarthritis, unspecified site: Secondary | ICD-10-CM | POA: Diagnosis not present

## 2015-03-23 DIAGNOSIS — I5030 Unspecified diastolic (congestive) heart failure: Secondary | ICD-10-CM | POA: Diagnosis not present

## 2015-03-23 DIAGNOSIS — G629 Polyneuropathy, unspecified: Secondary | ICD-10-CM | POA: Diagnosis not present

## 2015-03-23 DIAGNOSIS — E039 Hypothyroidism, unspecified: Secondary | ICD-10-CM | POA: Diagnosis not present

## 2015-03-25 DIAGNOSIS — M1990 Unspecified osteoarthritis, unspecified site: Secondary | ICD-10-CM | POA: Diagnosis not present

## 2015-03-25 DIAGNOSIS — J180 Bronchopneumonia, unspecified organism: Secondary | ICD-10-CM | POA: Diagnosis not present

## 2015-03-25 DIAGNOSIS — E039 Hypothyroidism, unspecified: Secondary | ICD-10-CM | POA: Diagnosis not present

## 2015-03-25 DIAGNOSIS — G629 Polyneuropathy, unspecified: Secondary | ICD-10-CM | POA: Diagnosis not present

## 2015-03-25 DIAGNOSIS — I5030 Unspecified diastolic (congestive) heart failure: Secondary | ICD-10-CM | POA: Diagnosis not present

## 2015-03-25 DIAGNOSIS — I10 Essential (primary) hypertension: Secondary | ICD-10-CM | POA: Diagnosis not present

## 2015-03-29 DIAGNOSIS — M1990 Unspecified osteoarthritis, unspecified site: Secondary | ICD-10-CM | POA: Diagnosis not present

## 2015-03-29 DIAGNOSIS — I10 Essential (primary) hypertension: Secondary | ICD-10-CM | POA: Diagnosis not present

## 2015-03-29 DIAGNOSIS — J180 Bronchopneumonia, unspecified organism: Secondary | ICD-10-CM | POA: Diagnosis not present

## 2015-03-29 DIAGNOSIS — G629 Polyneuropathy, unspecified: Secondary | ICD-10-CM | POA: Diagnosis not present

## 2015-03-29 DIAGNOSIS — I5030 Unspecified diastolic (congestive) heart failure: Secondary | ICD-10-CM | POA: Diagnosis not present

## 2015-03-29 DIAGNOSIS — E039 Hypothyroidism, unspecified: Secondary | ICD-10-CM | POA: Diagnosis not present

## 2015-03-30 DIAGNOSIS — E871 Hypo-osmolality and hyponatremia: Secondary | ICD-10-CM | POA: Diagnosis not present

## 2015-03-30 DIAGNOSIS — I10 Essential (primary) hypertension: Secondary | ICD-10-CM | POA: Diagnosis not present

## 2015-04-01 DIAGNOSIS — E039 Hypothyroidism, unspecified: Secondary | ICD-10-CM | POA: Diagnosis not present

## 2015-04-01 DIAGNOSIS — M1990 Unspecified osteoarthritis, unspecified site: Secondary | ICD-10-CM | POA: Diagnosis not present

## 2015-04-01 DIAGNOSIS — I5030 Unspecified diastolic (congestive) heart failure: Secondary | ICD-10-CM | POA: Diagnosis not present

## 2015-04-01 DIAGNOSIS — I10 Essential (primary) hypertension: Secondary | ICD-10-CM | POA: Diagnosis not present

## 2015-04-01 DIAGNOSIS — J180 Bronchopneumonia, unspecified organism: Secondary | ICD-10-CM | POA: Diagnosis not present

## 2015-04-01 DIAGNOSIS — G629 Polyneuropathy, unspecified: Secondary | ICD-10-CM | POA: Diagnosis not present

## 2015-04-05 DIAGNOSIS — I10 Essential (primary) hypertension: Secondary | ICD-10-CM | POA: Diagnosis not present

## 2015-04-05 DIAGNOSIS — E039 Hypothyroidism, unspecified: Secondary | ICD-10-CM | POA: Diagnosis not present

## 2015-04-05 DIAGNOSIS — M1990 Unspecified osteoarthritis, unspecified site: Secondary | ICD-10-CM | POA: Diagnosis not present

## 2015-04-05 DIAGNOSIS — I5030 Unspecified diastolic (congestive) heart failure: Secondary | ICD-10-CM | POA: Diagnosis not present

## 2015-04-05 DIAGNOSIS — G629 Polyneuropathy, unspecified: Secondary | ICD-10-CM | POA: Diagnosis not present

## 2015-04-05 DIAGNOSIS — J180 Bronchopneumonia, unspecified organism: Secondary | ICD-10-CM | POA: Diagnosis not present

## 2015-04-07 DIAGNOSIS — E039 Hypothyroidism, unspecified: Secondary | ICD-10-CM | POA: Diagnosis not present

## 2015-04-07 DIAGNOSIS — J180 Bronchopneumonia, unspecified organism: Secondary | ICD-10-CM | POA: Diagnosis not present

## 2015-04-07 DIAGNOSIS — M1990 Unspecified osteoarthritis, unspecified site: Secondary | ICD-10-CM | POA: Diagnosis not present

## 2015-04-07 DIAGNOSIS — I5030 Unspecified diastolic (congestive) heart failure: Secondary | ICD-10-CM | POA: Diagnosis not present

## 2015-04-07 DIAGNOSIS — G629 Polyneuropathy, unspecified: Secondary | ICD-10-CM | POA: Diagnosis not present

## 2015-04-07 DIAGNOSIS — I10 Essential (primary) hypertension: Secondary | ICD-10-CM | POA: Diagnosis not present

## 2015-04-12 DIAGNOSIS — M1990 Unspecified osteoarthritis, unspecified site: Secondary | ICD-10-CM | POA: Diagnosis not present

## 2015-04-12 DIAGNOSIS — E039 Hypothyroidism, unspecified: Secondary | ICD-10-CM | POA: Diagnosis not present

## 2015-04-12 DIAGNOSIS — J180 Bronchopneumonia, unspecified organism: Secondary | ICD-10-CM | POA: Diagnosis not present

## 2015-04-12 DIAGNOSIS — I5030 Unspecified diastolic (congestive) heart failure: Secondary | ICD-10-CM | POA: Diagnosis not present

## 2015-04-12 DIAGNOSIS — G629 Polyneuropathy, unspecified: Secondary | ICD-10-CM | POA: Diagnosis not present

## 2015-04-12 DIAGNOSIS — I10 Essential (primary) hypertension: Secondary | ICD-10-CM | POA: Diagnosis not present

## 2015-04-14 DIAGNOSIS — M25551 Pain in right hip: Secondary | ICD-10-CM | POA: Diagnosis not present

## 2015-04-14 DIAGNOSIS — M160 Bilateral primary osteoarthritis of hip: Secondary | ICD-10-CM | POA: Diagnosis not present

## 2015-04-14 DIAGNOSIS — N3000 Acute cystitis without hematuria: Secondary | ICD-10-CM | POA: Diagnosis not present

## 2015-04-14 DIAGNOSIS — M545 Low back pain: Secondary | ICD-10-CM | POA: Diagnosis not present

## 2015-04-14 DIAGNOSIS — M5136 Other intervertebral disc degeneration, lumbar region: Secondary | ICD-10-CM | POA: Diagnosis not present

## 2015-04-14 DIAGNOSIS — M1611 Unilateral primary osteoarthritis, right hip: Secondary | ICD-10-CM | POA: Diagnosis not present

## 2015-04-19 DIAGNOSIS — J180 Bronchopneumonia, unspecified organism: Secondary | ICD-10-CM | POA: Diagnosis not present

## 2015-04-19 DIAGNOSIS — E039 Hypothyroidism, unspecified: Secondary | ICD-10-CM | POA: Diagnosis not present

## 2015-04-19 DIAGNOSIS — I10 Essential (primary) hypertension: Secondary | ICD-10-CM | POA: Diagnosis not present

## 2015-04-19 DIAGNOSIS — M1990 Unspecified osteoarthritis, unspecified site: Secondary | ICD-10-CM | POA: Diagnosis not present

## 2015-04-19 DIAGNOSIS — I5030 Unspecified diastolic (congestive) heart failure: Secondary | ICD-10-CM | POA: Diagnosis not present

## 2015-04-19 DIAGNOSIS — G629 Polyneuropathy, unspecified: Secondary | ICD-10-CM | POA: Diagnosis not present

## 2015-04-26 DIAGNOSIS — H04123 Dry eye syndrome of bilateral lacrimal glands: Secondary | ICD-10-CM | POA: Diagnosis not present

## 2015-04-26 DIAGNOSIS — H26493 Other secondary cataract, bilateral: Secondary | ICD-10-CM | POA: Diagnosis not present

## 2015-04-26 DIAGNOSIS — H524 Presbyopia: Secondary | ICD-10-CM | POA: Diagnosis not present

## 2015-04-27 DIAGNOSIS — N3000 Acute cystitis without hematuria: Secondary | ICD-10-CM | POA: Diagnosis not present

## 2015-04-27 DIAGNOSIS — M1990 Unspecified osteoarthritis, unspecified site: Secondary | ICD-10-CM | POA: Diagnosis not present

## 2015-04-27 DIAGNOSIS — I10 Essential (primary) hypertension: Secondary | ICD-10-CM | POA: Diagnosis not present

## 2015-04-27 DIAGNOSIS — Z1389 Encounter for screening for other disorder: Secondary | ICD-10-CM | POA: Diagnosis not present

## 2015-04-27 DIAGNOSIS — F419 Anxiety disorder, unspecified: Secondary | ICD-10-CM | POA: Diagnosis not present

## 2015-06-28 DIAGNOSIS — Z79899 Other long term (current) drug therapy: Secondary | ICD-10-CM | POA: Diagnosis not present

## 2015-06-28 DIAGNOSIS — M8660 Other chronic osteomyelitis, unspecified site: Secondary | ICD-10-CM | POA: Diagnosis not present

## 2015-06-28 DIAGNOSIS — I10 Essential (primary) hypertension: Secondary | ICD-10-CM | POA: Diagnosis not present

## 2015-06-28 DIAGNOSIS — R739 Hyperglycemia, unspecified: Secondary | ICD-10-CM | POA: Diagnosis not present

## 2015-06-28 DIAGNOSIS — E785 Hyperlipidemia, unspecified: Secondary | ICD-10-CM | POA: Diagnosis not present

## 2015-08-31 DIAGNOSIS — Z0000 Encounter for general adult medical examination without abnormal findings: Secondary | ICD-10-CM | POA: Diagnosis not present

## 2015-08-31 DIAGNOSIS — E785 Hyperlipidemia, unspecified: Secondary | ICD-10-CM | POA: Diagnosis not present

## 2015-08-31 DIAGNOSIS — R7309 Other abnormal glucose: Secondary | ICD-10-CM | POA: Diagnosis not present

## 2015-08-31 DIAGNOSIS — E039 Hypothyroidism, unspecified: Secondary | ICD-10-CM | POA: Diagnosis not present

## 2015-08-31 DIAGNOSIS — I5030 Unspecified diastolic (congestive) heart failure: Secondary | ICD-10-CM | POA: Diagnosis not present

## 2015-08-31 DIAGNOSIS — I10 Essential (primary) hypertension: Secondary | ICD-10-CM | POA: Diagnosis not present

## 2015-08-31 DIAGNOSIS — Z79899 Other long term (current) drug therapy: Secondary | ICD-10-CM | POA: Diagnosis not present

## 2015-09-08 DIAGNOSIS — Z111 Encounter for screening for respiratory tuberculosis: Secondary | ICD-10-CM | POA: Diagnosis not present

## 2015-10-20 DIAGNOSIS — I5030 Unspecified diastolic (congestive) heart failure: Secondary | ICD-10-CM | POA: Diagnosis not present

## 2015-10-20 DIAGNOSIS — M7989 Other specified soft tissue disorders: Secondary | ICD-10-CM | POA: Diagnosis not present

## 2015-10-20 DIAGNOSIS — M1990 Unspecified osteoarthritis, unspecified site: Secondary | ICD-10-CM | POA: Diagnosis not present

## 2015-11-08 DIAGNOSIS — H26493 Other secondary cataract, bilateral: Secondary | ICD-10-CM | POA: Diagnosis not present

## 2015-11-14 DIAGNOSIS — R609 Edema, unspecified: Secondary | ICD-10-CM

## 2015-11-14 DIAGNOSIS — I447 Left bundle-branch block, unspecified: Secondary | ICD-10-CM

## 2015-11-14 HISTORY — DX: Left bundle-branch block, unspecified: I44.7

## 2015-11-14 HISTORY — DX: Edema, unspecified: R60.9

## 2015-11-15 DIAGNOSIS — R600 Localized edema: Secondary | ICD-10-CM | POA: Diagnosis not present

## 2015-11-15 DIAGNOSIS — I447 Left bundle-branch block, unspecified: Secondary | ICD-10-CM | POA: Diagnosis not present

## 2015-11-15 DIAGNOSIS — I5030 Unspecified diastolic (congestive) heart failure: Secondary | ICD-10-CM | POA: Diagnosis not present

## 2015-11-15 DIAGNOSIS — I110 Hypertensive heart disease with heart failure: Secondary | ICD-10-CM | POA: Diagnosis not present

## 2015-11-18 DIAGNOSIS — H26492 Other secondary cataract, left eye: Secondary | ICD-10-CM | POA: Diagnosis not present

## 2015-12-06 DIAGNOSIS — M5431 Sciatica, right side: Secondary | ICD-10-CM | POA: Diagnosis not present

## 2015-12-06 DIAGNOSIS — I10 Essential (primary) hypertension: Secondary | ICD-10-CM | POA: Diagnosis not present

## 2015-12-07 DIAGNOSIS — R0789 Other chest pain: Secondary | ICD-10-CM | POA: Diagnosis not present

## 2015-12-07 DIAGNOSIS — I160 Hypertensive urgency: Secondary | ICD-10-CM | POA: Diagnosis not present

## 2015-12-07 DIAGNOSIS — R030 Elevated blood-pressure reading, without diagnosis of hypertension: Secondary | ICD-10-CM | POA: Diagnosis not present

## 2015-12-07 DIAGNOSIS — J029 Acute pharyngitis, unspecified: Secondary | ICD-10-CM | POA: Diagnosis not present

## 2015-12-07 DIAGNOSIS — R079 Chest pain, unspecified: Secondary | ICD-10-CM | POA: Diagnosis not present

## 2015-12-07 DIAGNOSIS — I10 Essential (primary) hypertension: Secondary | ICD-10-CM | POA: Diagnosis not present

## 2015-12-07 DIAGNOSIS — R0600 Dyspnea, unspecified: Secondary | ICD-10-CM | POA: Diagnosis not present

## 2015-12-08 DIAGNOSIS — E876 Hypokalemia: Secondary | ICD-10-CM | POA: Diagnosis not present

## 2015-12-08 DIAGNOSIS — Z79899 Other long term (current) drug therapy: Secondary | ICD-10-CM | POA: Diagnosis not present

## 2015-12-08 DIAGNOSIS — R9431 Abnormal electrocardiogram [ECG] [EKG]: Secondary | ICD-10-CM | POA: Diagnosis not present

## 2015-12-08 DIAGNOSIS — R001 Bradycardia, unspecified: Secondary | ICD-10-CM | POA: Diagnosis not present

## 2015-12-08 DIAGNOSIS — Z6839 Body mass index (BMI) 39.0-39.9, adult: Secondary | ICD-10-CM | POA: Diagnosis not present

## 2015-12-08 DIAGNOSIS — E871 Hypo-osmolality and hyponatremia: Secondary | ICD-10-CM | POA: Diagnosis not present

## 2015-12-08 DIAGNOSIS — M1990 Unspecified osteoarthritis, unspecified site: Secondary | ICD-10-CM | POA: Diagnosis present

## 2015-12-08 DIAGNOSIS — I442 Atrioventricular block, complete: Secondary | ICD-10-CM | POA: Diagnosis not present

## 2015-12-08 DIAGNOSIS — Z66 Do not resuscitate: Secondary | ICD-10-CM | POA: Diagnosis present

## 2015-12-08 DIAGNOSIS — Z7982 Long term (current) use of aspirin: Secondary | ICD-10-CM | POA: Diagnosis not present

## 2015-12-08 DIAGNOSIS — I110 Hypertensive heart disease with heart failure: Secondary | ICD-10-CM | POA: Diagnosis present

## 2015-12-08 DIAGNOSIS — E7800 Pure hypercholesterolemia, unspecified: Secondary | ICD-10-CM | POA: Diagnosis present

## 2015-12-08 DIAGNOSIS — I119 Hypertensive heart disease without heart failure: Secondary | ICD-10-CM | POA: Diagnosis not present

## 2015-12-08 DIAGNOSIS — Z45018 Encounter for adjustment and management of other part of cardiac pacemaker: Secondary | ICD-10-CM | POA: Diagnosis not present

## 2015-12-08 DIAGNOSIS — R0789 Other chest pain: Secondary | ICD-10-CM | POA: Diagnosis not present

## 2015-12-08 DIAGNOSIS — R079 Chest pain, unspecified: Secondary | ICD-10-CM | POA: Diagnosis not present

## 2015-12-08 DIAGNOSIS — I4439 Other atrioventricular block: Secondary | ICD-10-CM | POA: Diagnosis present

## 2015-12-08 DIAGNOSIS — R0600 Dyspnea, unspecified: Secondary | ICD-10-CM | POA: Diagnosis not present

## 2015-12-08 DIAGNOSIS — E039 Hypothyroidism, unspecified: Secondary | ICD-10-CM | POA: Diagnosis present

## 2015-12-08 DIAGNOSIS — R030 Elevated blood-pressure reading, without diagnosis of hypertension: Secondary | ICD-10-CM | POA: Diagnosis not present

## 2015-12-08 DIAGNOSIS — I5033 Acute on chronic diastolic (congestive) heart failure: Secondary | ICD-10-CM | POA: Diagnosis not present

## 2015-12-08 DIAGNOSIS — E669 Obesity, unspecified: Secondary | ICD-10-CM | POA: Diagnosis present

## 2015-12-08 DIAGNOSIS — I160 Hypertensive urgency: Secondary | ICD-10-CM | POA: Diagnosis present

## 2015-12-08 DIAGNOSIS — I10 Essential (primary) hypertension: Secondary | ICD-10-CM | POA: Diagnosis not present

## 2015-12-08 DIAGNOSIS — R5381 Other malaise: Secondary | ICD-10-CM | POA: Diagnosis present

## 2015-12-08 DIAGNOSIS — G629 Polyneuropathy, unspecified: Secondary | ICD-10-CM | POA: Diagnosis not present

## 2015-12-08 DIAGNOSIS — J029 Acute pharyngitis, unspecified: Secondary | ICD-10-CM | POA: Diagnosis not present

## 2015-12-08 DIAGNOSIS — K219 Gastro-esophageal reflux disease without esophagitis: Secondary | ICD-10-CM | POA: Diagnosis present

## 2015-12-08 DIAGNOSIS — N390 Urinary tract infection, site not specified: Secondary | ICD-10-CM | POA: Diagnosis not present

## 2015-12-18 DIAGNOSIS — Z6833 Body mass index (BMI) 33.0-33.9, adult: Secondary | ICD-10-CM | POA: Diagnosis not present

## 2015-12-18 DIAGNOSIS — I110 Hypertensive heart disease with heart failure: Secondary | ICD-10-CM | POA: Diagnosis not present

## 2015-12-18 DIAGNOSIS — Z7982 Long term (current) use of aspirin: Secondary | ICD-10-CM | POA: Diagnosis not present

## 2015-12-18 DIAGNOSIS — I5031 Acute diastolic (congestive) heart failure: Secondary | ICD-10-CM | POA: Diagnosis not present

## 2015-12-18 DIAGNOSIS — Z48812 Encounter for surgical aftercare following surgery on the circulatory system: Secondary | ICD-10-CM | POA: Diagnosis not present

## 2015-12-18 DIAGNOSIS — E669 Obesity, unspecified: Secondary | ICD-10-CM | POA: Diagnosis not present

## 2015-12-18 DIAGNOSIS — G629 Polyneuropathy, unspecified: Secondary | ICD-10-CM | POA: Diagnosis not present

## 2015-12-18 DIAGNOSIS — M1991 Primary osteoarthritis, unspecified site: Secondary | ICD-10-CM | POA: Diagnosis not present

## 2015-12-18 DIAGNOSIS — Z950 Presence of cardiac pacemaker: Secondary | ICD-10-CM | POA: Diagnosis not present

## 2015-12-19 DIAGNOSIS — R531 Weakness: Secondary | ICD-10-CM | POA: Diagnosis not present

## 2015-12-19 DIAGNOSIS — R69 Illness, unspecified: Secondary | ICD-10-CM | POA: Diagnosis not present

## 2015-12-19 DIAGNOSIS — E871 Hypo-osmolality and hyponatremia: Secondary | ICD-10-CM | POA: Diagnosis not present

## 2015-12-19 DIAGNOSIS — R0602 Shortness of breath: Secondary | ICD-10-CM | POA: Diagnosis not present

## 2015-12-21 DIAGNOSIS — I5031 Acute diastolic (congestive) heart failure: Secondary | ICD-10-CM | POA: Diagnosis not present

## 2015-12-21 DIAGNOSIS — I110 Hypertensive heart disease with heart failure: Secondary | ICD-10-CM | POA: Diagnosis not present

## 2015-12-21 DIAGNOSIS — G629 Polyneuropathy, unspecified: Secondary | ICD-10-CM | POA: Diagnosis not present

## 2015-12-21 DIAGNOSIS — M1991 Primary osteoarthritis, unspecified site: Secondary | ICD-10-CM | POA: Diagnosis not present

## 2015-12-21 DIAGNOSIS — Z48812 Encounter for surgical aftercare following surgery on the circulatory system: Secondary | ICD-10-CM | POA: Diagnosis not present

## 2015-12-21 DIAGNOSIS — E669 Obesity, unspecified: Secondary | ICD-10-CM | POA: Diagnosis not present

## 2015-12-23 DIAGNOSIS — E669 Obesity, unspecified: Secondary | ICD-10-CM | POA: Diagnosis not present

## 2015-12-23 DIAGNOSIS — Z48812 Encounter for surgical aftercare following surgery on the circulatory system: Secondary | ICD-10-CM | POA: Diagnosis not present

## 2015-12-23 DIAGNOSIS — I110 Hypertensive heart disease with heart failure: Secondary | ICD-10-CM | POA: Diagnosis not present

## 2015-12-23 DIAGNOSIS — I5031 Acute diastolic (congestive) heart failure: Secondary | ICD-10-CM | POA: Diagnosis not present

## 2015-12-23 DIAGNOSIS — G629 Polyneuropathy, unspecified: Secondary | ICD-10-CM | POA: Diagnosis not present

## 2015-12-23 DIAGNOSIS — M1991 Primary osteoarthritis, unspecified site: Secondary | ICD-10-CM | POA: Diagnosis not present

## 2015-12-26 DIAGNOSIS — I5031 Acute diastolic (congestive) heart failure: Secondary | ICD-10-CM | POA: Diagnosis not present

## 2015-12-26 DIAGNOSIS — M1991 Primary osteoarthritis, unspecified site: Secondary | ICD-10-CM | POA: Diagnosis not present

## 2015-12-26 DIAGNOSIS — I110 Hypertensive heart disease with heart failure: Secondary | ICD-10-CM | POA: Diagnosis not present

## 2015-12-26 DIAGNOSIS — E669 Obesity, unspecified: Secondary | ICD-10-CM | POA: Diagnosis not present

## 2015-12-26 DIAGNOSIS — Z48812 Encounter for surgical aftercare following surgery on the circulatory system: Secondary | ICD-10-CM | POA: Diagnosis not present

## 2015-12-26 DIAGNOSIS — G629 Polyneuropathy, unspecified: Secondary | ICD-10-CM | POA: Diagnosis not present

## 2015-12-27 DIAGNOSIS — I5030 Unspecified diastolic (congestive) heart failure: Secondary | ICD-10-CM | POA: Diagnosis not present

## 2015-12-27 DIAGNOSIS — Z0000 Encounter for general adult medical examination without abnormal findings: Secondary | ICD-10-CM | POA: Diagnosis not present

## 2015-12-27 DIAGNOSIS — I10 Essential (primary) hypertension: Secondary | ICD-10-CM | POA: Diagnosis not present

## 2015-12-27 DIAGNOSIS — E039 Hypothyroidism, unspecified: Secondary | ICD-10-CM | POA: Diagnosis not present

## 2015-12-27 DIAGNOSIS — Z79899 Other long term (current) drug therapy: Secondary | ICD-10-CM | POA: Diagnosis not present

## 2015-12-27 DIAGNOSIS — Z950 Presence of cardiac pacemaker: Secondary | ICD-10-CM | POA: Diagnosis not present

## 2015-12-27 DIAGNOSIS — E785 Hyperlipidemia, unspecified: Secondary | ICD-10-CM | POA: Diagnosis not present

## 2015-12-27 DIAGNOSIS — R7309 Other abnormal glucose: Secondary | ICD-10-CM | POA: Diagnosis not present

## 2015-12-28 DIAGNOSIS — I5031 Acute diastolic (congestive) heart failure: Secondary | ICD-10-CM | POA: Diagnosis not present

## 2015-12-28 DIAGNOSIS — G629 Polyneuropathy, unspecified: Secondary | ICD-10-CM | POA: Diagnosis not present

## 2015-12-28 DIAGNOSIS — M1991 Primary osteoarthritis, unspecified site: Secondary | ICD-10-CM | POA: Diagnosis not present

## 2015-12-28 DIAGNOSIS — I110 Hypertensive heart disease with heart failure: Secondary | ICD-10-CM | POA: Diagnosis not present

## 2015-12-28 DIAGNOSIS — Z48812 Encounter for surgical aftercare following surgery on the circulatory system: Secondary | ICD-10-CM | POA: Diagnosis not present

## 2015-12-28 DIAGNOSIS — E669 Obesity, unspecified: Secondary | ICD-10-CM | POA: Diagnosis not present

## 2015-12-30 DIAGNOSIS — R001 Bradycardia, unspecified: Secondary | ICD-10-CM | POA: Insufficient documentation

## 2015-12-30 HISTORY — DX: Bradycardia, unspecified: R00.1

## 2015-12-31 DIAGNOSIS — M1991 Primary osteoarthritis, unspecified site: Secondary | ICD-10-CM | POA: Diagnosis not present

## 2015-12-31 DIAGNOSIS — I5031 Acute diastolic (congestive) heart failure: Secondary | ICD-10-CM | POA: Diagnosis not present

## 2015-12-31 DIAGNOSIS — I110 Hypertensive heart disease with heart failure: Secondary | ICD-10-CM | POA: Diagnosis not present

## 2015-12-31 DIAGNOSIS — Z48812 Encounter for surgical aftercare following surgery on the circulatory system: Secondary | ICD-10-CM | POA: Diagnosis not present

## 2015-12-31 DIAGNOSIS — E669 Obesity, unspecified: Secondary | ICD-10-CM | POA: Diagnosis not present

## 2015-12-31 DIAGNOSIS — G629 Polyneuropathy, unspecified: Secondary | ICD-10-CM | POA: Diagnosis not present

## 2016-01-03 DIAGNOSIS — M1991 Primary osteoarthritis, unspecified site: Secondary | ICD-10-CM | POA: Diagnosis not present

## 2016-01-03 DIAGNOSIS — I5031 Acute diastolic (congestive) heart failure: Secondary | ICD-10-CM | POA: Diagnosis not present

## 2016-01-03 DIAGNOSIS — E669 Obesity, unspecified: Secondary | ICD-10-CM | POA: Diagnosis not present

## 2016-01-03 DIAGNOSIS — I110 Hypertensive heart disease with heart failure: Secondary | ICD-10-CM | POA: Diagnosis not present

## 2016-01-03 DIAGNOSIS — Z48812 Encounter for surgical aftercare following surgery on the circulatory system: Secondary | ICD-10-CM | POA: Diagnosis not present

## 2016-01-03 DIAGNOSIS — G629 Polyneuropathy, unspecified: Secondary | ICD-10-CM | POA: Diagnosis not present

## 2016-01-04 DIAGNOSIS — G629 Polyneuropathy, unspecified: Secondary | ICD-10-CM | POA: Diagnosis not present

## 2016-01-04 DIAGNOSIS — M1991 Primary osteoarthritis, unspecified site: Secondary | ICD-10-CM | POA: Diagnosis not present

## 2016-01-04 DIAGNOSIS — I110 Hypertensive heart disease with heart failure: Secondary | ICD-10-CM | POA: Diagnosis not present

## 2016-01-04 DIAGNOSIS — I5031 Acute diastolic (congestive) heart failure: Secondary | ICD-10-CM | POA: Diagnosis not present

## 2016-01-04 DIAGNOSIS — E669 Obesity, unspecified: Secondary | ICD-10-CM | POA: Diagnosis not present

## 2016-01-04 DIAGNOSIS — Z48812 Encounter for surgical aftercare following surgery on the circulatory system: Secondary | ICD-10-CM | POA: Diagnosis not present

## 2016-01-05 DIAGNOSIS — I110 Hypertensive heart disease with heart failure: Secondary | ICD-10-CM | POA: Diagnosis not present

## 2016-01-05 DIAGNOSIS — I5031 Acute diastolic (congestive) heart failure: Secondary | ICD-10-CM | POA: Diagnosis not present

## 2016-01-05 DIAGNOSIS — I5032 Chronic diastolic (congestive) heart failure: Secondary | ICD-10-CM | POA: Diagnosis not present

## 2016-01-05 DIAGNOSIS — Z48812 Encounter for surgical aftercare following surgery on the circulatory system: Secondary | ICD-10-CM | POA: Diagnosis not present

## 2016-01-05 DIAGNOSIS — M1991 Primary osteoarthritis, unspecified site: Secondary | ICD-10-CM | POA: Diagnosis not present

## 2016-01-05 DIAGNOSIS — R0789 Other chest pain: Secondary | ICD-10-CM | POA: Insufficient documentation

## 2016-01-05 DIAGNOSIS — I447 Left bundle-branch block, unspecified: Secondary | ICD-10-CM | POA: Diagnosis not present

## 2016-01-05 DIAGNOSIS — E669 Obesity, unspecified: Secondary | ICD-10-CM | POA: Diagnosis not present

## 2016-01-05 DIAGNOSIS — R001 Bradycardia, unspecified: Secondary | ICD-10-CM | POA: Diagnosis not present

## 2016-01-05 DIAGNOSIS — G629 Polyneuropathy, unspecified: Secondary | ICD-10-CM | POA: Diagnosis not present

## 2016-01-05 HISTORY — DX: Other chest pain: R07.89

## 2016-01-06 DIAGNOSIS — I5031 Acute diastolic (congestive) heart failure: Secondary | ICD-10-CM | POA: Diagnosis not present

## 2016-01-06 DIAGNOSIS — M1991 Primary osteoarthritis, unspecified site: Secondary | ICD-10-CM | POA: Diagnosis not present

## 2016-01-06 DIAGNOSIS — G629 Polyneuropathy, unspecified: Secondary | ICD-10-CM | POA: Diagnosis not present

## 2016-01-06 DIAGNOSIS — E669 Obesity, unspecified: Secondary | ICD-10-CM | POA: Diagnosis not present

## 2016-01-06 DIAGNOSIS — I110 Hypertensive heart disease with heart failure: Secondary | ICD-10-CM | POA: Diagnosis not present

## 2016-01-06 DIAGNOSIS — Z48812 Encounter for surgical aftercare following surgery on the circulatory system: Secondary | ICD-10-CM | POA: Diagnosis not present

## 2016-01-07 DIAGNOSIS — Z48812 Encounter for surgical aftercare following surgery on the circulatory system: Secondary | ICD-10-CM | POA: Diagnosis not present

## 2016-01-07 DIAGNOSIS — G629 Polyneuropathy, unspecified: Secondary | ICD-10-CM | POA: Diagnosis not present

## 2016-01-07 DIAGNOSIS — I5031 Acute diastolic (congestive) heart failure: Secondary | ICD-10-CM | POA: Diagnosis not present

## 2016-01-07 DIAGNOSIS — E669 Obesity, unspecified: Secondary | ICD-10-CM | POA: Diagnosis not present

## 2016-01-07 DIAGNOSIS — M1991 Primary osteoarthritis, unspecified site: Secondary | ICD-10-CM | POA: Diagnosis not present

## 2016-01-07 DIAGNOSIS — I110 Hypertensive heart disease with heart failure: Secondary | ICD-10-CM | POA: Diagnosis not present

## 2016-01-10 DIAGNOSIS — E669 Obesity, unspecified: Secondary | ICD-10-CM | POA: Diagnosis not present

## 2016-01-10 DIAGNOSIS — I110 Hypertensive heart disease with heart failure: Secondary | ICD-10-CM | POA: Diagnosis not present

## 2016-01-10 DIAGNOSIS — I5031 Acute diastolic (congestive) heart failure: Secondary | ICD-10-CM | POA: Diagnosis not present

## 2016-01-10 DIAGNOSIS — M1991 Primary osteoarthritis, unspecified site: Secondary | ICD-10-CM | POA: Diagnosis not present

## 2016-01-10 DIAGNOSIS — G629 Polyneuropathy, unspecified: Secondary | ICD-10-CM | POA: Diagnosis not present

## 2016-01-10 DIAGNOSIS — Z48812 Encounter for surgical aftercare following surgery on the circulatory system: Secondary | ICD-10-CM | POA: Diagnosis not present

## 2016-01-11 DIAGNOSIS — R0789 Other chest pain: Secondary | ICD-10-CM | POA: Diagnosis not present

## 2016-01-11 DIAGNOSIS — I447 Left bundle-branch block, unspecified: Secondary | ICD-10-CM | POA: Diagnosis not present

## 2016-01-11 DIAGNOSIS — R001 Bradycardia, unspecified: Secondary | ICD-10-CM | POA: Diagnosis not present

## 2016-01-11 DIAGNOSIS — I5032 Chronic diastolic (congestive) heart failure: Secondary | ICD-10-CM | POA: Diagnosis not present

## 2016-01-12 DIAGNOSIS — Z48812 Encounter for surgical aftercare following surgery on the circulatory system: Secondary | ICD-10-CM | POA: Diagnosis not present

## 2016-01-12 DIAGNOSIS — E669 Obesity, unspecified: Secondary | ICD-10-CM | POA: Diagnosis not present

## 2016-01-12 DIAGNOSIS — G629 Polyneuropathy, unspecified: Secondary | ICD-10-CM | POA: Diagnosis not present

## 2016-01-12 DIAGNOSIS — M1991 Primary osteoarthritis, unspecified site: Secondary | ICD-10-CM | POA: Diagnosis not present

## 2016-01-12 DIAGNOSIS — I110 Hypertensive heart disease with heart failure: Secondary | ICD-10-CM | POA: Diagnosis not present

## 2016-01-12 DIAGNOSIS — I5031 Acute diastolic (congestive) heart failure: Secondary | ICD-10-CM | POA: Diagnosis not present

## 2016-01-13 DIAGNOSIS — M1991 Primary osteoarthritis, unspecified site: Secondary | ICD-10-CM | POA: Diagnosis not present

## 2016-01-13 DIAGNOSIS — I5031 Acute diastolic (congestive) heart failure: Secondary | ICD-10-CM | POA: Diagnosis not present

## 2016-01-13 DIAGNOSIS — E669 Obesity, unspecified: Secondary | ICD-10-CM | POA: Diagnosis not present

## 2016-01-13 DIAGNOSIS — I110 Hypertensive heart disease with heart failure: Secondary | ICD-10-CM | POA: Diagnosis not present

## 2016-01-13 DIAGNOSIS — G629 Polyneuropathy, unspecified: Secondary | ICD-10-CM | POA: Diagnosis not present

## 2016-01-13 DIAGNOSIS — Z48812 Encounter for surgical aftercare following surgery on the circulatory system: Secondary | ICD-10-CM | POA: Diagnosis not present

## 2016-01-17 DIAGNOSIS — E669 Obesity, unspecified: Secondary | ICD-10-CM | POA: Diagnosis not present

## 2016-01-17 DIAGNOSIS — M1991 Primary osteoarthritis, unspecified site: Secondary | ICD-10-CM | POA: Diagnosis not present

## 2016-01-17 DIAGNOSIS — G629 Polyneuropathy, unspecified: Secondary | ICD-10-CM | POA: Diagnosis not present

## 2016-01-17 DIAGNOSIS — Z48812 Encounter for surgical aftercare following surgery on the circulatory system: Secondary | ICD-10-CM | POA: Diagnosis not present

## 2016-01-17 DIAGNOSIS — I5031 Acute diastolic (congestive) heart failure: Secondary | ICD-10-CM | POA: Diagnosis not present

## 2016-01-17 DIAGNOSIS — I110 Hypertensive heart disease with heart failure: Secondary | ICD-10-CM | POA: Diagnosis not present

## 2016-01-18 DIAGNOSIS — G629 Polyneuropathy, unspecified: Secondary | ICD-10-CM | POA: Diagnosis not present

## 2016-01-18 DIAGNOSIS — I110 Hypertensive heart disease with heart failure: Secondary | ICD-10-CM | POA: Diagnosis not present

## 2016-01-18 DIAGNOSIS — E669 Obesity, unspecified: Secondary | ICD-10-CM | POA: Diagnosis not present

## 2016-01-18 DIAGNOSIS — M1991 Primary osteoarthritis, unspecified site: Secondary | ICD-10-CM | POA: Diagnosis not present

## 2016-01-18 DIAGNOSIS — Z48812 Encounter for surgical aftercare following surgery on the circulatory system: Secondary | ICD-10-CM | POA: Diagnosis not present

## 2016-01-18 DIAGNOSIS — I5031 Acute diastolic (congestive) heart failure: Secondary | ICD-10-CM | POA: Diagnosis not present

## 2016-01-20 DIAGNOSIS — I5031 Acute diastolic (congestive) heart failure: Secondary | ICD-10-CM | POA: Diagnosis not present

## 2016-01-20 DIAGNOSIS — I110 Hypertensive heart disease with heart failure: Secondary | ICD-10-CM | POA: Diagnosis not present

## 2016-01-20 DIAGNOSIS — G629 Polyneuropathy, unspecified: Secondary | ICD-10-CM | POA: Diagnosis not present

## 2016-01-20 DIAGNOSIS — E669 Obesity, unspecified: Secondary | ICD-10-CM | POA: Diagnosis not present

## 2016-01-20 DIAGNOSIS — Z48812 Encounter for surgical aftercare following surgery on the circulatory system: Secondary | ICD-10-CM | POA: Diagnosis not present

## 2016-01-20 DIAGNOSIS — M1991 Primary osteoarthritis, unspecified site: Secondary | ICD-10-CM | POA: Diagnosis not present

## 2016-01-23 DIAGNOSIS — M1991 Primary osteoarthritis, unspecified site: Secondary | ICD-10-CM | POA: Diagnosis not present

## 2016-01-23 DIAGNOSIS — Z48812 Encounter for surgical aftercare following surgery on the circulatory system: Secondary | ICD-10-CM | POA: Diagnosis not present

## 2016-01-23 DIAGNOSIS — I5031 Acute diastolic (congestive) heart failure: Secondary | ICD-10-CM | POA: Diagnosis not present

## 2016-01-23 DIAGNOSIS — I110 Hypertensive heart disease with heart failure: Secondary | ICD-10-CM | POA: Diagnosis not present

## 2016-01-23 DIAGNOSIS — E669 Obesity, unspecified: Secondary | ICD-10-CM | POA: Diagnosis not present

## 2016-01-23 DIAGNOSIS — G629 Polyneuropathy, unspecified: Secondary | ICD-10-CM | POA: Diagnosis not present

## 2016-01-24 DIAGNOSIS — I110 Hypertensive heart disease with heart failure: Secondary | ICD-10-CM | POA: Diagnosis not present

## 2016-01-24 DIAGNOSIS — I5031 Acute diastolic (congestive) heart failure: Secondary | ICD-10-CM | POA: Diagnosis not present

## 2016-01-24 DIAGNOSIS — Z48812 Encounter for surgical aftercare following surgery on the circulatory system: Secondary | ICD-10-CM | POA: Diagnosis not present

## 2016-01-24 DIAGNOSIS — E669 Obesity, unspecified: Secondary | ICD-10-CM | POA: Diagnosis not present

## 2016-01-24 DIAGNOSIS — M1991 Primary osteoarthritis, unspecified site: Secondary | ICD-10-CM | POA: Diagnosis not present

## 2016-01-24 DIAGNOSIS — G629 Polyneuropathy, unspecified: Secondary | ICD-10-CM | POA: Diagnosis not present

## 2016-01-26 DIAGNOSIS — G629 Polyneuropathy, unspecified: Secondary | ICD-10-CM | POA: Diagnosis not present

## 2016-01-26 DIAGNOSIS — Z48812 Encounter for surgical aftercare following surgery on the circulatory system: Secondary | ICD-10-CM | POA: Diagnosis not present

## 2016-01-26 DIAGNOSIS — E669 Obesity, unspecified: Secondary | ICD-10-CM | POA: Diagnosis not present

## 2016-01-26 DIAGNOSIS — I5031 Acute diastolic (congestive) heart failure: Secondary | ICD-10-CM | POA: Diagnosis not present

## 2016-01-26 DIAGNOSIS — I110 Hypertensive heart disease with heart failure: Secondary | ICD-10-CM | POA: Diagnosis not present

## 2016-01-26 DIAGNOSIS — M1991 Primary osteoarthritis, unspecified site: Secondary | ICD-10-CM | POA: Diagnosis not present

## 2016-01-27 DIAGNOSIS — M1991 Primary osteoarthritis, unspecified site: Secondary | ICD-10-CM | POA: Diagnosis not present

## 2016-01-27 DIAGNOSIS — I110 Hypertensive heart disease with heart failure: Secondary | ICD-10-CM | POA: Diagnosis not present

## 2016-01-27 DIAGNOSIS — E669 Obesity, unspecified: Secondary | ICD-10-CM | POA: Diagnosis not present

## 2016-01-27 DIAGNOSIS — Z48812 Encounter for surgical aftercare following surgery on the circulatory system: Secondary | ICD-10-CM | POA: Diagnosis not present

## 2016-01-27 DIAGNOSIS — G629 Polyneuropathy, unspecified: Secondary | ICD-10-CM | POA: Diagnosis not present

## 2016-01-27 DIAGNOSIS — I5031 Acute diastolic (congestive) heart failure: Secondary | ICD-10-CM | POA: Diagnosis not present

## 2016-01-31 DIAGNOSIS — Z48812 Encounter for surgical aftercare following surgery on the circulatory system: Secondary | ICD-10-CM | POA: Diagnosis not present

## 2016-01-31 DIAGNOSIS — I110 Hypertensive heart disease with heart failure: Secondary | ICD-10-CM | POA: Diagnosis not present

## 2016-01-31 DIAGNOSIS — I5031 Acute diastolic (congestive) heart failure: Secondary | ICD-10-CM | POA: Diagnosis not present

## 2016-01-31 DIAGNOSIS — E669 Obesity, unspecified: Secondary | ICD-10-CM | POA: Diagnosis not present

## 2016-01-31 DIAGNOSIS — M1991 Primary osteoarthritis, unspecified site: Secondary | ICD-10-CM | POA: Diagnosis not present

## 2016-01-31 DIAGNOSIS — G629 Polyneuropathy, unspecified: Secondary | ICD-10-CM | POA: Diagnosis not present

## 2016-02-02 DIAGNOSIS — Z48812 Encounter for surgical aftercare following surgery on the circulatory system: Secondary | ICD-10-CM | POA: Diagnosis not present

## 2016-02-02 DIAGNOSIS — I110 Hypertensive heart disease with heart failure: Secondary | ICD-10-CM | POA: Diagnosis not present

## 2016-02-02 DIAGNOSIS — G629 Polyneuropathy, unspecified: Secondary | ICD-10-CM | POA: Diagnosis not present

## 2016-02-02 DIAGNOSIS — I5031 Acute diastolic (congestive) heart failure: Secondary | ICD-10-CM | POA: Diagnosis not present

## 2016-02-02 DIAGNOSIS — M1991 Primary osteoarthritis, unspecified site: Secondary | ICD-10-CM | POA: Diagnosis not present

## 2016-02-02 DIAGNOSIS — E669 Obesity, unspecified: Secondary | ICD-10-CM | POA: Diagnosis not present

## 2016-02-03 DIAGNOSIS — G629 Polyneuropathy, unspecified: Secondary | ICD-10-CM | POA: Diagnosis not present

## 2016-02-03 DIAGNOSIS — I5031 Acute diastolic (congestive) heart failure: Secondary | ICD-10-CM | POA: Diagnosis not present

## 2016-02-03 DIAGNOSIS — E669 Obesity, unspecified: Secondary | ICD-10-CM | POA: Diagnosis not present

## 2016-02-03 DIAGNOSIS — Z48812 Encounter for surgical aftercare following surgery on the circulatory system: Secondary | ICD-10-CM | POA: Diagnosis not present

## 2016-02-03 DIAGNOSIS — I110 Hypertensive heart disease with heart failure: Secondary | ICD-10-CM | POA: Diagnosis not present

## 2016-02-03 DIAGNOSIS — M1991 Primary osteoarthritis, unspecified site: Secondary | ICD-10-CM | POA: Diagnosis not present

## 2016-02-04 DIAGNOSIS — I110 Hypertensive heart disease with heart failure: Secondary | ICD-10-CM | POA: Diagnosis not present

## 2016-02-04 DIAGNOSIS — E669 Obesity, unspecified: Secondary | ICD-10-CM | POA: Diagnosis not present

## 2016-02-04 DIAGNOSIS — G629 Polyneuropathy, unspecified: Secondary | ICD-10-CM | POA: Diagnosis not present

## 2016-02-04 DIAGNOSIS — Z48812 Encounter for surgical aftercare following surgery on the circulatory system: Secondary | ICD-10-CM | POA: Diagnosis not present

## 2016-02-04 DIAGNOSIS — M1991 Primary osteoarthritis, unspecified site: Secondary | ICD-10-CM | POA: Diagnosis not present

## 2016-02-04 DIAGNOSIS — I5031 Acute diastolic (congestive) heart failure: Secondary | ICD-10-CM | POA: Diagnosis not present

## 2016-02-08 DIAGNOSIS — I110 Hypertensive heart disease with heart failure: Secondary | ICD-10-CM | POA: Diagnosis not present

## 2016-02-08 DIAGNOSIS — G629 Polyneuropathy, unspecified: Secondary | ICD-10-CM | POA: Diagnosis not present

## 2016-02-08 DIAGNOSIS — M1991 Primary osteoarthritis, unspecified site: Secondary | ICD-10-CM | POA: Diagnosis not present

## 2016-02-08 DIAGNOSIS — H26491 Other secondary cataract, right eye: Secondary | ICD-10-CM | POA: Diagnosis not present

## 2016-02-08 DIAGNOSIS — E669 Obesity, unspecified: Secondary | ICD-10-CM | POA: Diagnosis not present

## 2016-02-08 DIAGNOSIS — I5031 Acute diastolic (congestive) heart failure: Secondary | ICD-10-CM | POA: Diagnosis not present

## 2016-02-08 DIAGNOSIS — Z48812 Encounter for surgical aftercare following surgery on the circulatory system: Secondary | ICD-10-CM | POA: Diagnosis not present

## 2016-02-10 DIAGNOSIS — I5031 Acute diastolic (congestive) heart failure: Secondary | ICD-10-CM | POA: Diagnosis not present

## 2016-02-10 DIAGNOSIS — Z48812 Encounter for surgical aftercare following surgery on the circulatory system: Secondary | ICD-10-CM | POA: Diagnosis not present

## 2016-02-10 DIAGNOSIS — I110 Hypertensive heart disease with heart failure: Secondary | ICD-10-CM | POA: Diagnosis not present

## 2016-02-10 DIAGNOSIS — M1991 Primary osteoarthritis, unspecified site: Secondary | ICD-10-CM | POA: Diagnosis not present

## 2016-02-10 DIAGNOSIS — G629 Polyneuropathy, unspecified: Secondary | ICD-10-CM | POA: Diagnosis not present

## 2016-02-10 DIAGNOSIS — E669 Obesity, unspecified: Secondary | ICD-10-CM | POA: Diagnosis not present

## 2016-02-14 DIAGNOSIS — I5031 Acute diastolic (congestive) heart failure: Secondary | ICD-10-CM | POA: Diagnosis not present

## 2016-02-14 DIAGNOSIS — E669 Obesity, unspecified: Secondary | ICD-10-CM | POA: Diagnosis not present

## 2016-02-14 DIAGNOSIS — G629 Polyneuropathy, unspecified: Secondary | ICD-10-CM | POA: Diagnosis not present

## 2016-02-14 DIAGNOSIS — Z48812 Encounter for surgical aftercare following surgery on the circulatory system: Secondary | ICD-10-CM | POA: Diagnosis not present

## 2016-02-14 DIAGNOSIS — I110 Hypertensive heart disease with heart failure: Secondary | ICD-10-CM | POA: Diagnosis not present

## 2016-02-14 DIAGNOSIS — M1991 Primary osteoarthritis, unspecified site: Secondary | ICD-10-CM | POA: Diagnosis not present

## 2016-02-16 DIAGNOSIS — M1991 Primary osteoarthritis, unspecified site: Secondary | ICD-10-CM | POA: Diagnosis not present

## 2016-02-16 DIAGNOSIS — Z7982 Long term (current) use of aspirin: Secondary | ICD-10-CM | POA: Diagnosis not present

## 2016-02-16 DIAGNOSIS — I5031 Acute diastolic (congestive) heart failure: Secondary | ICD-10-CM | POA: Diagnosis not present

## 2016-02-16 DIAGNOSIS — Z950 Presence of cardiac pacemaker: Secondary | ICD-10-CM | POA: Diagnosis not present

## 2016-02-16 DIAGNOSIS — Z6833 Body mass index (BMI) 33.0-33.9, adult: Secondary | ICD-10-CM | POA: Diagnosis not present

## 2016-02-16 DIAGNOSIS — E669 Obesity, unspecified: Secondary | ICD-10-CM | POA: Diagnosis not present

## 2016-02-16 DIAGNOSIS — Z48812 Encounter for surgical aftercare following surgery on the circulatory system: Secondary | ICD-10-CM | POA: Diagnosis not present

## 2016-02-16 DIAGNOSIS — I110 Hypertensive heart disease with heart failure: Secondary | ICD-10-CM | POA: Diagnosis not present

## 2016-02-16 DIAGNOSIS — G629 Polyneuropathy, unspecified: Secondary | ICD-10-CM | POA: Diagnosis not present

## 2016-02-21 DIAGNOSIS — I110 Hypertensive heart disease with heart failure: Secondary | ICD-10-CM | POA: Diagnosis not present

## 2016-02-21 DIAGNOSIS — E669 Obesity, unspecified: Secondary | ICD-10-CM | POA: Diagnosis not present

## 2016-02-21 DIAGNOSIS — G629 Polyneuropathy, unspecified: Secondary | ICD-10-CM | POA: Diagnosis not present

## 2016-02-21 DIAGNOSIS — I5031 Acute diastolic (congestive) heart failure: Secondary | ICD-10-CM | POA: Diagnosis not present

## 2016-02-21 DIAGNOSIS — Z48812 Encounter for surgical aftercare following surgery on the circulatory system: Secondary | ICD-10-CM | POA: Diagnosis not present

## 2016-02-21 DIAGNOSIS — M1991 Primary osteoarthritis, unspecified site: Secondary | ICD-10-CM | POA: Diagnosis not present

## 2016-02-22 DIAGNOSIS — Z23 Encounter for immunization: Secondary | ICD-10-CM | POA: Diagnosis not present

## 2016-02-25 DIAGNOSIS — E785 Hyperlipidemia, unspecified: Secondary | ICD-10-CM | POA: Diagnosis not present

## 2016-02-25 DIAGNOSIS — I5030 Unspecified diastolic (congestive) heart failure: Secondary | ICD-10-CM | POA: Diagnosis not present

## 2016-02-25 DIAGNOSIS — R7309 Other abnormal glucose: Secondary | ICD-10-CM | POA: Diagnosis not present

## 2016-02-25 DIAGNOSIS — J329 Chronic sinusitis, unspecified: Secondary | ICD-10-CM | POA: Diagnosis not present

## 2016-02-25 DIAGNOSIS — Z950 Presence of cardiac pacemaker: Secondary | ICD-10-CM | POA: Diagnosis not present

## 2016-02-25 DIAGNOSIS — J40 Bronchitis, not specified as acute or chronic: Secondary | ICD-10-CM | POA: Diagnosis not present

## 2016-02-28 DIAGNOSIS — M1991 Primary osteoarthritis, unspecified site: Secondary | ICD-10-CM | POA: Diagnosis not present

## 2016-02-28 DIAGNOSIS — G629 Polyneuropathy, unspecified: Secondary | ICD-10-CM | POA: Diagnosis not present

## 2016-02-28 DIAGNOSIS — E669 Obesity, unspecified: Secondary | ICD-10-CM | POA: Diagnosis not present

## 2016-02-28 DIAGNOSIS — I5031 Acute diastolic (congestive) heart failure: Secondary | ICD-10-CM | POA: Diagnosis not present

## 2016-02-28 DIAGNOSIS — I110 Hypertensive heart disease with heart failure: Secondary | ICD-10-CM | POA: Diagnosis not present

## 2016-02-28 DIAGNOSIS — Z48812 Encounter for surgical aftercare following surgery on the circulatory system: Secondary | ICD-10-CM | POA: Diagnosis not present

## 2016-03-08 DIAGNOSIS — M1991 Primary osteoarthritis, unspecified site: Secondary | ICD-10-CM | POA: Diagnosis not present

## 2016-03-08 DIAGNOSIS — I5031 Acute diastolic (congestive) heart failure: Secondary | ICD-10-CM | POA: Diagnosis not present

## 2016-03-08 DIAGNOSIS — G629 Polyneuropathy, unspecified: Secondary | ICD-10-CM | POA: Diagnosis not present

## 2016-03-08 DIAGNOSIS — I110 Hypertensive heart disease with heart failure: Secondary | ICD-10-CM | POA: Diagnosis not present

## 2016-03-08 DIAGNOSIS — E669 Obesity, unspecified: Secondary | ICD-10-CM | POA: Diagnosis not present

## 2016-03-08 DIAGNOSIS — Z48812 Encounter for surgical aftercare following surgery on the circulatory system: Secondary | ICD-10-CM | POA: Diagnosis not present

## 2016-03-14 DIAGNOSIS — I110 Hypertensive heart disease with heart failure: Secondary | ICD-10-CM | POA: Diagnosis not present

## 2016-03-14 DIAGNOSIS — M1991 Primary osteoarthritis, unspecified site: Secondary | ICD-10-CM | POA: Diagnosis not present

## 2016-03-14 DIAGNOSIS — E669 Obesity, unspecified: Secondary | ICD-10-CM | POA: Diagnosis not present

## 2016-03-14 DIAGNOSIS — Z48812 Encounter for surgical aftercare following surgery on the circulatory system: Secondary | ICD-10-CM | POA: Diagnosis not present

## 2016-03-14 DIAGNOSIS — I5031 Acute diastolic (congestive) heart failure: Secondary | ICD-10-CM | POA: Diagnosis not present

## 2016-03-14 DIAGNOSIS — G629 Polyneuropathy, unspecified: Secondary | ICD-10-CM | POA: Diagnosis not present

## 2016-03-20 DIAGNOSIS — M1991 Primary osteoarthritis, unspecified site: Secondary | ICD-10-CM | POA: Diagnosis not present

## 2016-03-20 DIAGNOSIS — E669 Obesity, unspecified: Secondary | ICD-10-CM | POA: Diagnosis not present

## 2016-03-20 DIAGNOSIS — G629 Polyneuropathy, unspecified: Secondary | ICD-10-CM | POA: Diagnosis not present

## 2016-03-20 DIAGNOSIS — I5031 Acute diastolic (congestive) heart failure: Secondary | ICD-10-CM | POA: Diagnosis not present

## 2016-03-20 DIAGNOSIS — I110 Hypertensive heart disease with heart failure: Secondary | ICD-10-CM | POA: Diagnosis not present

## 2016-03-20 DIAGNOSIS — Z48812 Encounter for surgical aftercare following surgery on the circulatory system: Secondary | ICD-10-CM | POA: Diagnosis not present

## 2016-03-27 DIAGNOSIS — I110 Hypertensive heart disease with heart failure: Secondary | ICD-10-CM | POA: Diagnosis not present

## 2016-03-27 DIAGNOSIS — E669 Obesity, unspecified: Secondary | ICD-10-CM | POA: Diagnosis not present

## 2016-03-27 DIAGNOSIS — I5031 Acute diastolic (congestive) heart failure: Secondary | ICD-10-CM | POA: Diagnosis not present

## 2016-03-27 DIAGNOSIS — Z48812 Encounter for surgical aftercare following surgery on the circulatory system: Secondary | ICD-10-CM | POA: Diagnosis not present

## 2016-03-27 DIAGNOSIS — G629 Polyneuropathy, unspecified: Secondary | ICD-10-CM | POA: Diagnosis not present

## 2016-03-27 DIAGNOSIS — M1991 Primary osteoarthritis, unspecified site: Secondary | ICD-10-CM | POA: Diagnosis not present

## 2016-04-03 DIAGNOSIS — I5031 Acute diastolic (congestive) heart failure: Secondary | ICD-10-CM | POA: Diagnosis not present

## 2016-04-03 DIAGNOSIS — E669 Obesity, unspecified: Secondary | ICD-10-CM | POA: Diagnosis not present

## 2016-04-03 DIAGNOSIS — Z48812 Encounter for surgical aftercare following surgery on the circulatory system: Secondary | ICD-10-CM | POA: Diagnosis not present

## 2016-04-03 DIAGNOSIS — M1991 Primary osteoarthritis, unspecified site: Secondary | ICD-10-CM | POA: Diagnosis not present

## 2016-04-03 DIAGNOSIS — G629 Polyneuropathy, unspecified: Secondary | ICD-10-CM | POA: Diagnosis not present

## 2016-04-03 DIAGNOSIS — I110 Hypertensive heart disease with heart failure: Secondary | ICD-10-CM | POA: Diagnosis not present

## 2016-04-11 DIAGNOSIS — M1991 Primary osteoarthritis, unspecified site: Secondary | ICD-10-CM | POA: Diagnosis not present

## 2016-04-11 DIAGNOSIS — Z48812 Encounter for surgical aftercare following surgery on the circulatory system: Secondary | ICD-10-CM | POA: Diagnosis not present

## 2016-04-11 DIAGNOSIS — I110 Hypertensive heart disease with heart failure: Secondary | ICD-10-CM | POA: Diagnosis not present

## 2016-04-11 DIAGNOSIS — I5031 Acute diastolic (congestive) heart failure: Secondary | ICD-10-CM | POA: Diagnosis not present

## 2016-04-11 DIAGNOSIS — E669 Obesity, unspecified: Secondary | ICD-10-CM | POA: Diagnosis not present

## 2016-04-11 DIAGNOSIS — G629 Polyneuropathy, unspecified: Secondary | ICD-10-CM | POA: Diagnosis not present

## 2016-04-16 DIAGNOSIS — Z7982 Long term (current) use of aspirin: Secondary | ICD-10-CM | POA: Diagnosis not present

## 2016-04-16 DIAGNOSIS — I110 Hypertensive heart disease with heart failure: Secondary | ICD-10-CM | POA: Diagnosis not present

## 2016-04-16 DIAGNOSIS — Z48812 Encounter for surgical aftercare following surgery on the circulatory system: Secondary | ICD-10-CM | POA: Diagnosis not present

## 2016-04-16 DIAGNOSIS — G629 Polyneuropathy, unspecified: Secondary | ICD-10-CM | POA: Diagnosis not present

## 2016-04-16 DIAGNOSIS — I5031 Acute diastolic (congestive) heart failure: Secondary | ICD-10-CM | POA: Diagnosis not present

## 2016-04-16 DIAGNOSIS — M6281 Muscle weakness (generalized): Secondary | ICD-10-CM | POA: Diagnosis not present

## 2016-04-16 DIAGNOSIS — Z950 Presence of cardiac pacemaker: Secondary | ICD-10-CM | POA: Diagnosis not present

## 2016-04-16 DIAGNOSIS — E669 Obesity, unspecified: Secondary | ICD-10-CM | POA: Diagnosis not present

## 2016-04-16 DIAGNOSIS — Z6833 Body mass index (BMI) 33.0-33.9, adult: Secondary | ICD-10-CM | POA: Diagnosis not present

## 2016-04-16 DIAGNOSIS — M1991 Primary osteoarthritis, unspecified site: Secondary | ICD-10-CM | POA: Diagnosis not present

## 2016-04-17 DIAGNOSIS — K1120 Sialoadenitis, unspecified: Secondary | ICD-10-CM | POA: Diagnosis not present

## 2016-04-19 DIAGNOSIS — I110 Hypertensive heart disease with heart failure: Secondary | ICD-10-CM | POA: Diagnosis not present

## 2016-04-19 DIAGNOSIS — M1991 Primary osteoarthritis, unspecified site: Secondary | ICD-10-CM | POA: Diagnosis not present

## 2016-04-19 DIAGNOSIS — E669 Obesity, unspecified: Secondary | ICD-10-CM | POA: Diagnosis not present

## 2016-04-19 DIAGNOSIS — I5031 Acute diastolic (congestive) heart failure: Secondary | ICD-10-CM | POA: Diagnosis not present

## 2016-04-19 DIAGNOSIS — Z48812 Encounter for surgical aftercare following surgery on the circulatory system: Secondary | ICD-10-CM | POA: Diagnosis not present

## 2016-04-19 DIAGNOSIS — M6281 Muscle weakness (generalized): Secondary | ICD-10-CM | POA: Diagnosis not present

## 2016-04-24 DIAGNOSIS — Z48812 Encounter for surgical aftercare following surgery on the circulatory system: Secondary | ICD-10-CM | POA: Diagnosis not present

## 2016-04-24 DIAGNOSIS — I110 Hypertensive heart disease with heart failure: Secondary | ICD-10-CM | POA: Diagnosis not present

## 2016-04-24 DIAGNOSIS — M1991 Primary osteoarthritis, unspecified site: Secondary | ICD-10-CM | POA: Diagnosis not present

## 2016-04-24 DIAGNOSIS — E669 Obesity, unspecified: Secondary | ICD-10-CM | POA: Diagnosis not present

## 2016-04-24 DIAGNOSIS — M6281 Muscle weakness (generalized): Secondary | ICD-10-CM | POA: Diagnosis not present

## 2016-04-24 DIAGNOSIS — I5031 Acute diastolic (congestive) heart failure: Secondary | ICD-10-CM | POA: Diagnosis not present

## 2016-05-01 DIAGNOSIS — E669 Obesity, unspecified: Secondary | ICD-10-CM | POA: Diagnosis not present

## 2016-05-01 DIAGNOSIS — M1991 Primary osteoarthritis, unspecified site: Secondary | ICD-10-CM | POA: Diagnosis not present

## 2016-05-01 DIAGNOSIS — M6281 Muscle weakness (generalized): Secondary | ICD-10-CM | POA: Diagnosis not present

## 2016-05-01 DIAGNOSIS — Z48812 Encounter for surgical aftercare following surgery on the circulatory system: Secondary | ICD-10-CM | POA: Diagnosis not present

## 2016-05-01 DIAGNOSIS — I110 Hypertensive heart disease with heart failure: Secondary | ICD-10-CM | POA: Diagnosis not present

## 2016-05-01 DIAGNOSIS — I5031 Acute diastolic (congestive) heart failure: Secondary | ICD-10-CM | POA: Diagnosis not present

## 2016-05-04 DIAGNOSIS — Z950 Presence of cardiac pacemaker: Secondary | ICD-10-CM | POA: Diagnosis not present

## 2016-05-04 DIAGNOSIS — Z45018 Encounter for adjustment and management of other part of cardiac pacemaker: Secondary | ICD-10-CM | POA: Diagnosis not present

## 2016-05-04 DIAGNOSIS — R001 Bradycardia, unspecified: Secondary | ICD-10-CM | POA: Diagnosis not present

## 2016-05-09 DIAGNOSIS — J0100 Acute maxillary sinusitis, unspecified: Secondary | ICD-10-CM | POA: Diagnosis not present

## 2016-05-09 DIAGNOSIS — R05 Cough: Secondary | ICD-10-CM | POA: Diagnosis not present

## 2016-05-11 DIAGNOSIS — E669 Obesity, unspecified: Secondary | ICD-10-CM | POA: Diagnosis not present

## 2016-05-11 DIAGNOSIS — I110 Hypertensive heart disease with heart failure: Secondary | ICD-10-CM | POA: Diagnosis not present

## 2016-05-11 DIAGNOSIS — M6281 Muscle weakness (generalized): Secondary | ICD-10-CM | POA: Diagnosis not present

## 2016-05-11 DIAGNOSIS — Z48812 Encounter for surgical aftercare following surgery on the circulatory system: Secondary | ICD-10-CM | POA: Diagnosis not present

## 2016-05-11 DIAGNOSIS — I5031 Acute diastolic (congestive) heart failure: Secondary | ICD-10-CM | POA: Diagnosis not present

## 2016-05-11 DIAGNOSIS — M1991 Primary osteoarthritis, unspecified site: Secondary | ICD-10-CM | POA: Diagnosis not present

## 2016-05-14 DIAGNOSIS — M6281 Muscle weakness (generalized): Secondary | ICD-10-CM | POA: Diagnosis not present

## 2016-05-14 DIAGNOSIS — I5031 Acute diastolic (congestive) heart failure: Secondary | ICD-10-CM | POA: Diagnosis not present

## 2016-05-14 DIAGNOSIS — Z48812 Encounter for surgical aftercare following surgery on the circulatory system: Secondary | ICD-10-CM | POA: Diagnosis not present

## 2016-05-14 DIAGNOSIS — I110 Hypertensive heart disease with heart failure: Secondary | ICD-10-CM | POA: Diagnosis not present

## 2016-05-14 DIAGNOSIS — M1991 Primary osteoarthritis, unspecified site: Secondary | ICD-10-CM | POA: Diagnosis not present

## 2016-05-14 DIAGNOSIS — E669 Obesity, unspecified: Secondary | ICD-10-CM | POA: Diagnosis not present

## 2016-05-24 DIAGNOSIS — J329 Chronic sinusitis, unspecified: Secondary | ICD-10-CM | POA: Diagnosis not present

## 2016-05-24 DIAGNOSIS — J40 Bronchitis, not specified as acute or chronic: Secondary | ICD-10-CM | POA: Diagnosis not present

## 2016-05-29 DIAGNOSIS — M6281 Muscle weakness (generalized): Secondary | ICD-10-CM | POA: Diagnosis not present

## 2016-05-29 DIAGNOSIS — Z48812 Encounter for surgical aftercare following surgery on the circulatory system: Secondary | ICD-10-CM | POA: Diagnosis not present

## 2016-05-29 DIAGNOSIS — I5031 Acute diastolic (congestive) heart failure: Secondary | ICD-10-CM | POA: Diagnosis not present

## 2016-05-29 DIAGNOSIS — I110 Hypertensive heart disease with heart failure: Secondary | ICD-10-CM | POA: Diagnosis not present

## 2016-05-29 DIAGNOSIS — E669 Obesity, unspecified: Secondary | ICD-10-CM | POA: Diagnosis not present

## 2016-05-29 DIAGNOSIS — M1991 Primary osteoarthritis, unspecified site: Secondary | ICD-10-CM | POA: Diagnosis not present

## 2016-06-05 DIAGNOSIS — M6281 Muscle weakness (generalized): Secondary | ICD-10-CM | POA: Diagnosis not present

## 2016-06-05 DIAGNOSIS — I5031 Acute diastolic (congestive) heart failure: Secondary | ICD-10-CM | POA: Diagnosis not present

## 2016-06-05 DIAGNOSIS — E669 Obesity, unspecified: Secondary | ICD-10-CM | POA: Diagnosis not present

## 2016-06-05 DIAGNOSIS — Z48812 Encounter for surgical aftercare following surgery on the circulatory system: Secondary | ICD-10-CM | POA: Diagnosis not present

## 2016-06-05 DIAGNOSIS — M1991 Primary osteoarthritis, unspecified site: Secondary | ICD-10-CM | POA: Diagnosis not present

## 2016-06-05 DIAGNOSIS — I110 Hypertensive heart disease with heart failure: Secondary | ICD-10-CM | POA: Diagnosis not present

## 2016-06-05 DIAGNOSIS — I10 Essential (primary) hypertension: Secondary | ICD-10-CM | POA: Diagnosis not present

## 2016-06-12 DIAGNOSIS — I110 Hypertensive heart disease with heart failure: Secondary | ICD-10-CM | POA: Diagnosis not present

## 2016-06-12 DIAGNOSIS — M1991 Primary osteoarthritis, unspecified site: Secondary | ICD-10-CM | POA: Diagnosis not present

## 2016-06-12 DIAGNOSIS — E669 Obesity, unspecified: Secondary | ICD-10-CM | POA: Diagnosis not present

## 2016-06-12 DIAGNOSIS — Z48812 Encounter for surgical aftercare following surgery on the circulatory system: Secondary | ICD-10-CM | POA: Diagnosis not present

## 2016-06-12 DIAGNOSIS — I5031 Acute diastolic (congestive) heart failure: Secondary | ICD-10-CM | POA: Diagnosis not present

## 2016-06-12 DIAGNOSIS — M6281 Muscle weakness (generalized): Secondary | ICD-10-CM | POA: Diagnosis not present

## 2016-06-15 DIAGNOSIS — Z7982 Long term (current) use of aspirin: Secondary | ICD-10-CM | POA: Diagnosis not present

## 2016-06-15 DIAGNOSIS — G629 Polyneuropathy, unspecified: Secondary | ICD-10-CM | POA: Diagnosis not present

## 2016-06-15 DIAGNOSIS — I5031 Acute diastolic (congestive) heart failure: Secondary | ICD-10-CM | POA: Diagnosis not present

## 2016-06-15 DIAGNOSIS — E669 Obesity, unspecified: Secondary | ICD-10-CM | POA: Diagnosis not present

## 2016-06-15 DIAGNOSIS — Z48812 Encounter for surgical aftercare following surgery on the circulatory system: Secondary | ICD-10-CM | POA: Diagnosis not present

## 2016-06-15 DIAGNOSIS — Z950 Presence of cardiac pacemaker: Secondary | ICD-10-CM | POA: Diagnosis not present

## 2016-06-15 DIAGNOSIS — I110 Hypertensive heart disease with heart failure: Secondary | ICD-10-CM | POA: Diagnosis not present

## 2016-06-15 DIAGNOSIS — Z6833 Body mass index (BMI) 33.0-33.9, adult: Secondary | ICD-10-CM | POA: Diagnosis not present

## 2016-06-15 DIAGNOSIS — M6281 Muscle weakness (generalized): Secondary | ICD-10-CM | POA: Diagnosis not present

## 2016-06-15 DIAGNOSIS — M1991 Primary osteoarthritis, unspecified site: Secondary | ICD-10-CM | POA: Diagnosis not present

## 2016-06-20 DIAGNOSIS — I110 Hypertensive heart disease with heart failure: Secondary | ICD-10-CM | POA: Diagnosis not present

## 2016-06-20 DIAGNOSIS — I5031 Acute diastolic (congestive) heart failure: Secondary | ICD-10-CM | POA: Diagnosis not present

## 2016-06-20 DIAGNOSIS — E669 Obesity, unspecified: Secondary | ICD-10-CM | POA: Diagnosis not present

## 2016-06-20 DIAGNOSIS — Z48812 Encounter for surgical aftercare following surgery on the circulatory system: Secondary | ICD-10-CM | POA: Diagnosis not present

## 2016-06-20 DIAGNOSIS — M6281 Muscle weakness (generalized): Secondary | ICD-10-CM | POA: Diagnosis not present

## 2016-06-20 DIAGNOSIS — M1991 Primary osteoarthritis, unspecified site: Secondary | ICD-10-CM | POA: Diagnosis not present

## 2016-06-27 DIAGNOSIS — E669 Obesity, unspecified: Secondary | ICD-10-CM | POA: Diagnosis not present

## 2016-06-27 DIAGNOSIS — Z48812 Encounter for surgical aftercare following surgery on the circulatory system: Secondary | ICD-10-CM | POA: Diagnosis not present

## 2016-06-27 DIAGNOSIS — M6281 Muscle weakness (generalized): Secondary | ICD-10-CM | POA: Diagnosis not present

## 2016-06-27 DIAGNOSIS — I5031 Acute diastolic (congestive) heart failure: Secondary | ICD-10-CM | POA: Diagnosis not present

## 2016-06-27 DIAGNOSIS — M1991 Primary osteoarthritis, unspecified site: Secondary | ICD-10-CM | POA: Diagnosis not present

## 2016-06-27 DIAGNOSIS — I110 Hypertensive heart disease with heart failure: Secondary | ICD-10-CM | POA: Diagnosis not present

## 2016-06-30 DIAGNOSIS — Z950 Presence of cardiac pacemaker: Secondary | ICD-10-CM | POA: Diagnosis not present

## 2016-07-04 DIAGNOSIS — Z48812 Encounter for surgical aftercare following surgery on the circulatory system: Secondary | ICD-10-CM | POA: Diagnosis not present

## 2016-07-04 DIAGNOSIS — I110 Hypertensive heart disease with heart failure: Secondary | ICD-10-CM | POA: Diagnosis not present

## 2016-07-04 DIAGNOSIS — E669 Obesity, unspecified: Secondary | ICD-10-CM | POA: Diagnosis not present

## 2016-07-04 DIAGNOSIS — M6281 Muscle weakness (generalized): Secondary | ICD-10-CM | POA: Diagnosis not present

## 2016-07-04 DIAGNOSIS — M1991 Primary osteoarthritis, unspecified site: Secondary | ICD-10-CM | POA: Diagnosis not present

## 2016-07-04 DIAGNOSIS — I5031 Acute diastolic (congestive) heart failure: Secondary | ICD-10-CM | POA: Diagnosis not present

## 2016-07-11 DIAGNOSIS — M6281 Muscle weakness (generalized): Secondary | ICD-10-CM | POA: Diagnosis not present

## 2016-07-11 DIAGNOSIS — Z48812 Encounter for surgical aftercare following surgery on the circulatory system: Secondary | ICD-10-CM | POA: Diagnosis not present

## 2016-07-11 DIAGNOSIS — M1991 Primary osteoarthritis, unspecified site: Secondary | ICD-10-CM | POA: Diagnosis not present

## 2016-07-11 DIAGNOSIS — E669 Obesity, unspecified: Secondary | ICD-10-CM | POA: Diagnosis not present

## 2016-07-11 DIAGNOSIS — I110 Hypertensive heart disease with heart failure: Secondary | ICD-10-CM | POA: Diagnosis not present

## 2016-07-11 DIAGNOSIS — I5031 Acute diastolic (congestive) heart failure: Secondary | ICD-10-CM | POA: Diagnosis not present

## 2016-07-18 DIAGNOSIS — M1991 Primary osteoarthritis, unspecified site: Secondary | ICD-10-CM | POA: Diagnosis not present

## 2016-07-18 DIAGNOSIS — I110 Hypertensive heart disease with heart failure: Secondary | ICD-10-CM | POA: Diagnosis not present

## 2016-07-18 DIAGNOSIS — M6281 Muscle weakness (generalized): Secondary | ICD-10-CM | POA: Diagnosis not present

## 2016-07-18 DIAGNOSIS — I5031 Acute diastolic (congestive) heart failure: Secondary | ICD-10-CM | POA: Diagnosis not present

## 2016-07-18 DIAGNOSIS — E669 Obesity, unspecified: Secondary | ICD-10-CM | POA: Diagnosis not present

## 2016-07-18 DIAGNOSIS — Z48812 Encounter for surgical aftercare following surgery on the circulatory system: Secondary | ICD-10-CM | POA: Diagnosis not present

## 2016-07-24 DIAGNOSIS — Z48812 Encounter for surgical aftercare following surgery on the circulatory system: Secondary | ICD-10-CM | POA: Diagnosis not present

## 2016-07-24 DIAGNOSIS — M1991 Primary osteoarthritis, unspecified site: Secondary | ICD-10-CM | POA: Diagnosis not present

## 2016-07-24 DIAGNOSIS — M6281 Muscle weakness (generalized): Secondary | ICD-10-CM | POA: Diagnosis not present

## 2016-07-24 DIAGNOSIS — I5031 Acute diastolic (congestive) heart failure: Secondary | ICD-10-CM | POA: Diagnosis not present

## 2016-07-24 DIAGNOSIS — E669 Obesity, unspecified: Secondary | ICD-10-CM | POA: Diagnosis not present

## 2016-07-24 DIAGNOSIS — I110 Hypertensive heart disease with heart failure: Secondary | ICD-10-CM | POA: Diagnosis not present

## 2016-07-25 DIAGNOSIS — Z950 Presence of cardiac pacemaker: Secondary | ICD-10-CM | POA: Diagnosis not present

## 2016-07-25 DIAGNOSIS — I10 Essential (primary) hypertension: Secondary | ICD-10-CM | POA: Diagnosis not present

## 2016-07-25 DIAGNOSIS — Z79899 Other long term (current) drug therapy: Secondary | ICD-10-CM | POA: Diagnosis not present

## 2016-07-25 DIAGNOSIS — K219 Gastro-esophageal reflux disease without esophagitis: Secondary | ICD-10-CM | POA: Diagnosis not present

## 2016-07-25 DIAGNOSIS — E785 Hyperlipidemia, unspecified: Secondary | ICD-10-CM | POA: Diagnosis not present

## 2016-07-25 DIAGNOSIS — I5030 Unspecified diastolic (congestive) heart failure: Secondary | ICD-10-CM | POA: Diagnosis not present

## 2016-07-25 DIAGNOSIS — E039 Hypothyroidism, unspecified: Secondary | ICD-10-CM | POA: Diagnosis not present

## 2016-07-25 DIAGNOSIS — Z131 Encounter for screening for diabetes mellitus: Secondary | ICD-10-CM | POA: Diagnosis not present

## 2016-07-31 DIAGNOSIS — B351 Tinea unguium: Secondary | ICD-10-CM | POA: Diagnosis not present

## 2016-07-31 DIAGNOSIS — I739 Peripheral vascular disease, unspecified: Secondary | ICD-10-CM | POA: Diagnosis not present

## 2016-07-31 DIAGNOSIS — I70203 Unspecified atherosclerosis of native arteries of extremities, bilateral legs: Secondary | ICD-10-CM | POA: Diagnosis not present

## 2016-07-31 DIAGNOSIS — M79674 Pain in right toe(s): Secondary | ICD-10-CM | POA: Diagnosis not present

## 2016-08-01 DIAGNOSIS — M1991 Primary osteoarthritis, unspecified site: Secondary | ICD-10-CM | POA: Diagnosis not present

## 2016-08-01 DIAGNOSIS — I5031 Acute diastolic (congestive) heart failure: Secondary | ICD-10-CM | POA: Diagnosis not present

## 2016-08-01 DIAGNOSIS — Z48812 Encounter for surgical aftercare following surgery on the circulatory system: Secondary | ICD-10-CM | POA: Diagnosis not present

## 2016-08-01 DIAGNOSIS — I110 Hypertensive heart disease with heart failure: Secondary | ICD-10-CM | POA: Diagnosis not present

## 2016-08-01 DIAGNOSIS — M6281 Muscle weakness (generalized): Secondary | ICD-10-CM | POA: Diagnosis not present

## 2016-08-01 DIAGNOSIS — E669 Obesity, unspecified: Secondary | ICD-10-CM | POA: Diagnosis not present

## 2016-08-08 DIAGNOSIS — M6281 Muscle weakness (generalized): Secondary | ICD-10-CM | POA: Diagnosis not present

## 2016-08-08 DIAGNOSIS — I5031 Acute diastolic (congestive) heart failure: Secondary | ICD-10-CM | POA: Diagnosis not present

## 2016-08-08 DIAGNOSIS — M1991 Primary osteoarthritis, unspecified site: Secondary | ICD-10-CM | POA: Diagnosis not present

## 2016-08-08 DIAGNOSIS — E669 Obesity, unspecified: Secondary | ICD-10-CM | POA: Diagnosis not present

## 2016-08-08 DIAGNOSIS — I110 Hypertensive heart disease with heart failure: Secondary | ICD-10-CM | POA: Diagnosis not present

## 2016-08-08 DIAGNOSIS — Z48812 Encounter for surgical aftercare following surgery on the circulatory system: Secondary | ICD-10-CM | POA: Diagnosis not present

## 2016-08-14 DIAGNOSIS — M1991 Primary osteoarthritis, unspecified site: Secondary | ICD-10-CM | POA: Diagnosis not present

## 2016-08-14 DIAGNOSIS — I110 Hypertensive heart disease with heart failure: Secondary | ICD-10-CM | POA: Diagnosis not present

## 2016-08-14 DIAGNOSIS — Z6833 Body mass index (BMI) 33.0-33.9, adult: Secondary | ICD-10-CM | POA: Diagnosis not present

## 2016-08-14 DIAGNOSIS — Z7982 Long term (current) use of aspirin: Secondary | ICD-10-CM | POA: Diagnosis not present

## 2016-08-14 DIAGNOSIS — I5031 Acute diastolic (congestive) heart failure: Secondary | ICD-10-CM | POA: Diagnosis not present

## 2016-08-14 DIAGNOSIS — M6281 Muscle weakness (generalized): Secondary | ICD-10-CM | POA: Diagnosis not present

## 2016-08-14 DIAGNOSIS — E669 Obesity, unspecified: Secondary | ICD-10-CM | POA: Diagnosis not present

## 2016-08-14 DIAGNOSIS — Z950 Presence of cardiac pacemaker: Secondary | ICD-10-CM | POA: Diagnosis not present

## 2016-08-14 DIAGNOSIS — Z48812 Encounter for surgical aftercare following surgery on the circulatory system: Secondary | ICD-10-CM | POA: Diagnosis not present

## 2016-08-14 DIAGNOSIS — G629 Polyneuropathy, unspecified: Secondary | ICD-10-CM | POA: Diagnosis not present

## 2016-08-15 DIAGNOSIS — I5031 Acute diastolic (congestive) heart failure: Secondary | ICD-10-CM | POA: Diagnosis not present

## 2016-08-15 DIAGNOSIS — E669 Obesity, unspecified: Secondary | ICD-10-CM | POA: Diagnosis not present

## 2016-08-15 DIAGNOSIS — M6281 Muscle weakness (generalized): Secondary | ICD-10-CM | POA: Diagnosis not present

## 2016-08-15 DIAGNOSIS — I110 Hypertensive heart disease with heart failure: Secondary | ICD-10-CM | POA: Diagnosis not present

## 2016-08-15 DIAGNOSIS — M1991 Primary osteoarthritis, unspecified site: Secondary | ICD-10-CM | POA: Diagnosis not present

## 2016-08-15 DIAGNOSIS — Z48812 Encounter for surgical aftercare following surgery on the circulatory system: Secondary | ICD-10-CM | POA: Diagnosis not present

## 2016-08-16 DIAGNOSIS — I447 Left bundle-branch block, unspecified: Secondary | ICD-10-CM | POA: Diagnosis not present

## 2016-08-16 DIAGNOSIS — Z950 Presence of cardiac pacemaker: Secondary | ICD-10-CM | POA: Diagnosis not present

## 2016-08-16 DIAGNOSIS — M5136 Other intervertebral disc degeneration, lumbar region: Secondary | ICD-10-CM | POA: Diagnosis not present

## 2016-08-16 DIAGNOSIS — R001 Bradycardia, unspecified: Secondary | ICD-10-CM | POA: Diagnosis not present

## 2016-08-16 DIAGNOSIS — I5032 Chronic diastolic (congestive) heart failure: Secondary | ICD-10-CM | POA: Diagnosis not present

## 2016-08-17 DIAGNOSIS — R001 Bradycardia, unspecified: Secondary | ICD-10-CM | POA: Diagnosis not present

## 2016-08-17 DIAGNOSIS — I447 Left bundle-branch block, unspecified: Secondary | ICD-10-CM | POA: Diagnosis not present

## 2016-08-17 DIAGNOSIS — M5136 Other intervertebral disc degeneration, lumbar region: Secondary | ICD-10-CM | POA: Diagnosis not present

## 2016-08-17 DIAGNOSIS — I5032 Chronic diastolic (congestive) heart failure: Secondary | ICD-10-CM | POA: Diagnosis not present

## 2016-08-17 DIAGNOSIS — Z950 Presence of cardiac pacemaker: Secondary | ICD-10-CM | POA: Diagnosis not present

## 2016-08-22 DIAGNOSIS — M1991 Primary osteoarthritis, unspecified site: Secondary | ICD-10-CM | POA: Diagnosis not present

## 2016-08-22 DIAGNOSIS — E669 Obesity, unspecified: Secondary | ICD-10-CM | POA: Diagnosis not present

## 2016-08-22 DIAGNOSIS — M6281 Muscle weakness (generalized): Secondary | ICD-10-CM | POA: Diagnosis not present

## 2016-08-22 DIAGNOSIS — Z48812 Encounter for surgical aftercare following surgery on the circulatory system: Secondary | ICD-10-CM | POA: Diagnosis not present

## 2016-08-22 DIAGNOSIS — I110 Hypertensive heart disease with heart failure: Secondary | ICD-10-CM | POA: Diagnosis not present

## 2016-08-22 DIAGNOSIS — I5031 Acute diastolic (congestive) heart failure: Secondary | ICD-10-CM | POA: Diagnosis not present

## 2016-08-24 DIAGNOSIS — H26493 Other secondary cataract, bilateral: Secondary | ICD-10-CM | POA: Diagnosis not present

## 2016-08-29 DIAGNOSIS — M1991 Primary osteoarthritis, unspecified site: Secondary | ICD-10-CM | POA: Diagnosis not present

## 2016-08-29 DIAGNOSIS — I5031 Acute diastolic (congestive) heart failure: Secondary | ICD-10-CM | POA: Diagnosis not present

## 2016-08-29 DIAGNOSIS — Z48812 Encounter for surgical aftercare following surgery on the circulatory system: Secondary | ICD-10-CM | POA: Diagnosis not present

## 2016-08-29 DIAGNOSIS — I110 Hypertensive heart disease with heart failure: Secondary | ICD-10-CM | POA: Diagnosis not present

## 2016-08-29 DIAGNOSIS — E669 Obesity, unspecified: Secondary | ICD-10-CM | POA: Diagnosis not present

## 2016-08-29 DIAGNOSIS — M6281 Muscle weakness (generalized): Secondary | ICD-10-CM | POA: Diagnosis not present

## 2016-08-30 DIAGNOSIS — J988 Other specified respiratory disorders: Secondary | ICD-10-CM | POA: Diagnosis not present

## 2016-08-30 DIAGNOSIS — M7989 Other specified soft tissue disorders: Secondary | ICD-10-CM | POA: Diagnosis not present

## 2016-09-04 DIAGNOSIS — K219 Gastro-esophageal reflux disease without esophagitis: Secondary | ICD-10-CM | POA: Diagnosis not present

## 2016-09-04 DIAGNOSIS — Z0000 Encounter for general adult medical examination without abnormal findings: Secondary | ICD-10-CM | POA: Diagnosis not present

## 2016-09-04 DIAGNOSIS — Z950 Presence of cardiac pacemaker: Secondary | ICD-10-CM | POA: Diagnosis not present

## 2016-09-04 DIAGNOSIS — I5030 Unspecified diastolic (congestive) heart failure: Secondary | ICD-10-CM | POA: Diagnosis not present

## 2016-09-04 DIAGNOSIS — E039 Hypothyroidism, unspecified: Secondary | ICD-10-CM | POA: Diagnosis not present

## 2016-09-04 DIAGNOSIS — I10 Essential (primary) hypertension: Secondary | ICD-10-CM | POA: Diagnosis not present

## 2016-09-04 DIAGNOSIS — R7309 Other abnormal glucose: Secondary | ICD-10-CM | POA: Diagnosis not present

## 2016-09-04 DIAGNOSIS — Z79899 Other long term (current) drug therapy: Secondary | ICD-10-CM | POA: Diagnosis not present

## 2016-09-04 DIAGNOSIS — E785 Hyperlipidemia, unspecified: Secondary | ICD-10-CM | POA: Diagnosis not present

## 2016-09-05 DIAGNOSIS — E669 Obesity, unspecified: Secondary | ICD-10-CM | POA: Diagnosis not present

## 2016-09-05 DIAGNOSIS — M6281 Muscle weakness (generalized): Secondary | ICD-10-CM | POA: Diagnosis not present

## 2016-09-05 DIAGNOSIS — I5031 Acute diastolic (congestive) heart failure: Secondary | ICD-10-CM | POA: Diagnosis not present

## 2016-09-05 DIAGNOSIS — M1991 Primary osteoarthritis, unspecified site: Secondary | ICD-10-CM | POA: Diagnosis not present

## 2016-09-05 DIAGNOSIS — I110 Hypertensive heart disease with heart failure: Secondary | ICD-10-CM | POA: Diagnosis not present

## 2016-09-05 DIAGNOSIS — Z48812 Encounter for surgical aftercare following surgery on the circulatory system: Secondary | ICD-10-CM | POA: Diagnosis not present

## 2016-09-12 DIAGNOSIS — M1991 Primary osteoarthritis, unspecified site: Secondary | ICD-10-CM | POA: Diagnosis not present

## 2016-09-12 DIAGNOSIS — I5031 Acute diastolic (congestive) heart failure: Secondary | ICD-10-CM | POA: Diagnosis not present

## 2016-09-12 DIAGNOSIS — Z48812 Encounter for surgical aftercare following surgery on the circulatory system: Secondary | ICD-10-CM | POA: Diagnosis not present

## 2016-09-12 DIAGNOSIS — E669 Obesity, unspecified: Secondary | ICD-10-CM | POA: Diagnosis not present

## 2016-09-12 DIAGNOSIS — M6281 Muscle weakness (generalized): Secondary | ICD-10-CM | POA: Diagnosis not present

## 2016-09-12 DIAGNOSIS — I110 Hypertensive heart disease with heart failure: Secondary | ICD-10-CM | POA: Diagnosis not present

## 2016-09-18 DIAGNOSIS — M6281 Muscle weakness (generalized): Secondary | ICD-10-CM | POA: Diagnosis not present

## 2016-09-18 DIAGNOSIS — I110 Hypertensive heart disease with heart failure: Secondary | ICD-10-CM | POA: Diagnosis not present

## 2016-09-18 DIAGNOSIS — I5031 Acute diastolic (congestive) heart failure: Secondary | ICD-10-CM | POA: Diagnosis not present

## 2016-09-18 DIAGNOSIS — E669 Obesity, unspecified: Secondary | ICD-10-CM | POA: Diagnosis not present

## 2016-09-18 DIAGNOSIS — M1991 Primary osteoarthritis, unspecified site: Secondary | ICD-10-CM | POA: Diagnosis not present

## 2016-09-18 DIAGNOSIS — Z48812 Encounter for surgical aftercare following surgery on the circulatory system: Secondary | ICD-10-CM | POA: Diagnosis not present

## 2016-09-26 DIAGNOSIS — E669 Obesity, unspecified: Secondary | ICD-10-CM | POA: Diagnosis not present

## 2016-09-26 DIAGNOSIS — I5031 Acute diastolic (congestive) heart failure: Secondary | ICD-10-CM | POA: Diagnosis not present

## 2016-09-26 DIAGNOSIS — M1991 Primary osteoarthritis, unspecified site: Secondary | ICD-10-CM | POA: Diagnosis not present

## 2016-09-26 DIAGNOSIS — M6281 Muscle weakness (generalized): Secondary | ICD-10-CM | POA: Diagnosis not present

## 2016-09-26 DIAGNOSIS — Z48812 Encounter for surgical aftercare following surgery on the circulatory system: Secondary | ICD-10-CM | POA: Diagnosis not present

## 2016-09-26 DIAGNOSIS — I110 Hypertensive heart disease with heart failure: Secondary | ICD-10-CM | POA: Diagnosis not present

## 2016-09-29 DIAGNOSIS — Z950 Presence of cardiac pacemaker: Secondary | ICD-10-CM | POA: Diagnosis not present

## 2016-10-02 DIAGNOSIS — M1991 Primary osteoarthritis, unspecified site: Secondary | ICD-10-CM | POA: Diagnosis not present

## 2016-10-02 DIAGNOSIS — E669 Obesity, unspecified: Secondary | ICD-10-CM | POA: Diagnosis not present

## 2016-10-02 DIAGNOSIS — I5031 Acute diastolic (congestive) heart failure: Secondary | ICD-10-CM | POA: Diagnosis not present

## 2016-10-02 DIAGNOSIS — I110 Hypertensive heart disease with heart failure: Secondary | ICD-10-CM | POA: Diagnosis not present

## 2016-10-02 DIAGNOSIS — M6281 Muscle weakness (generalized): Secondary | ICD-10-CM | POA: Diagnosis not present

## 2016-10-02 DIAGNOSIS — Z48812 Encounter for surgical aftercare following surgery on the circulatory system: Secondary | ICD-10-CM | POA: Diagnosis not present

## 2016-10-09 DIAGNOSIS — E669 Obesity, unspecified: Secondary | ICD-10-CM | POA: Diagnosis not present

## 2016-10-09 DIAGNOSIS — Z48812 Encounter for surgical aftercare following surgery on the circulatory system: Secondary | ICD-10-CM | POA: Diagnosis not present

## 2016-10-09 DIAGNOSIS — M1991 Primary osteoarthritis, unspecified site: Secondary | ICD-10-CM | POA: Diagnosis not present

## 2016-10-09 DIAGNOSIS — M6281 Muscle weakness (generalized): Secondary | ICD-10-CM | POA: Diagnosis not present

## 2016-10-09 DIAGNOSIS — I5031 Acute diastolic (congestive) heart failure: Secondary | ICD-10-CM | POA: Diagnosis not present

## 2016-10-09 DIAGNOSIS — I110 Hypertensive heart disease with heart failure: Secondary | ICD-10-CM | POA: Diagnosis not present

## 2016-10-13 DIAGNOSIS — I447 Left bundle-branch block, unspecified: Secondary | ICD-10-CM | POA: Diagnosis not present

## 2016-10-13 DIAGNOSIS — E669 Obesity, unspecified: Secondary | ICD-10-CM | POA: Diagnosis not present

## 2016-10-13 DIAGNOSIS — G629 Polyneuropathy, unspecified: Secondary | ICD-10-CM | POA: Diagnosis not present

## 2016-10-13 DIAGNOSIS — Z7982 Long term (current) use of aspirin: Secondary | ICD-10-CM | POA: Diagnosis not present

## 2016-10-13 DIAGNOSIS — I110 Hypertensive heart disease with heart failure: Secondary | ICD-10-CM | POA: Diagnosis not present

## 2016-10-13 DIAGNOSIS — Z6833 Body mass index (BMI) 33.0-33.9, adult: Secondary | ICD-10-CM | POA: Diagnosis not present

## 2016-10-13 DIAGNOSIS — Z950 Presence of cardiac pacemaker: Secondary | ICD-10-CM | POA: Diagnosis not present

## 2016-10-13 DIAGNOSIS — M6281 Muscle weakness (generalized): Secondary | ICD-10-CM | POA: Diagnosis not present

## 2016-10-13 DIAGNOSIS — M1991 Primary osteoarthritis, unspecified site: Secondary | ICD-10-CM | POA: Diagnosis not present

## 2016-10-13 DIAGNOSIS — I5032 Chronic diastolic (congestive) heart failure: Secondary | ICD-10-CM | POA: Diagnosis not present

## 2016-10-13 DIAGNOSIS — I5031 Acute diastolic (congestive) heart failure: Secondary | ICD-10-CM | POA: Diagnosis not present

## 2016-10-13 DIAGNOSIS — Z48812 Encounter for surgical aftercare following surgery on the circulatory system: Secondary | ICD-10-CM | POA: Diagnosis not present

## 2016-10-16 DIAGNOSIS — M6281 Muscle weakness (generalized): Secondary | ICD-10-CM | POA: Diagnosis not present

## 2016-10-16 DIAGNOSIS — I110 Hypertensive heart disease with heart failure: Secondary | ICD-10-CM | POA: Diagnosis not present

## 2016-10-16 DIAGNOSIS — Z48812 Encounter for surgical aftercare following surgery on the circulatory system: Secondary | ICD-10-CM | POA: Diagnosis not present

## 2016-10-16 DIAGNOSIS — M1991 Primary osteoarthritis, unspecified site: Secondary | ICD-10-CM | POA: Diagnosis not present

## 2016-10-16 DIAGNOSIS — E669 Obesity, unspecified: Secondary | ICD-10-CM | POA: Diagnosis not present

## 2016-10-16 DIAGNOSIS — I5031 Acute diastolic (congestive) heart failure: Secondary | ICD-10-CM | POA: Diagnosis not present

## 2016-10-23 DIAGNOSIS — E669 Obesity, unspecified: Secondary | ICD-10-CM | POA: Diagnosis not present

## 2016-10-23 DIAGNOSIS — I5031 Acute diastolic (congestive) heart failure: Secondary | ICD-10-CM | POA: Diagnosis not present

## 2016-10-23 DIAGNOSIS — I110 Hypertensive heart disease with heart failure: Secondary | ICD-10-CM | POA: Diagnosis not present

## 2016-10-23 DIAGNOSIS — M6281 Muscle weakness (generalized): Secondary | ICD-10-CM | POA: Diagnosis not present

## 2016-10-23 DIAGNOSIS — Z48812 Encounter for surgical aftercare following surgery on the circulatory system: Secondary | ICD-10-CM | POA: Diagnosis not present

## 2016-10-23 DIAGNOSIS — M1991 Primary osteoarthritis, unspecified site: Secondary | ICD-10-CM | POA: Diagnosis not present

## 2016-10-30 DIAGNOSIS — M6281 Muscle weakness (generalized): Secondary | ICD-10-CM | POA: Diagnosis not present

## 2016-10-30 DIAGNOSIS — I5031 Acute diastolic (congestive) heart failure: Secondary | ICD-10-CM | POA: Diagnosis not present

## 2016-10-30 DIAGNOSIS — M1991 Primary osteoarthritis, unspecified site: Secondary | ICD-10-CM | POA: Diagnosis not present

## 2016-10-30 DIAGNOSIS — E669 Obesity, unspecified: Secondary | ICD-10-CM | POA: Diagnosis not present

## 2016-10-30 DIAGNOSIS — Z48812 Encounter for surgical aftercare following surgery on the circulatory system: Secondary | ICD-10-CM | POA: Diagnosis not present

## 2016-10-30 DIAGNOSIS — I110 Hypertensive heart disease with heart failure: Secondary | ICD-10-CM | POA: Diagnosis not present

## 2016-11-06 DIAGNOSIS — I5031 Acute diastolic (congestive) heart failure: Secondary | ICD-10-CM | POA: Diagnosis not present

## 2016-11-06 DIAGNOSIS — Z48812 Encounter for surgical aftercare following surgery on the circulatory system: Secondary | ICD-10-CM | POA: Diagnosis not present

## 2016-11-06 DIAGNOSIS — E669 Obesity, unspecified: Secondary | ICD-10-CM | POA: Diagnosis not present

## 2016-11-06 DIAGNOSIS — I110 Hypertensive heart disease with heart failure: Secondary | ICD-10-CM | POA: Diagnosis not present

## 2016-11-06 DIAGNOSIS — M1991 Primary osteoarthritis, unspecified site: Secondary | ICD-10-CM | POA: Diagnosis not present

## 2016-11-06 DIAGNOSIS — M6281 Muscle weakness (generalized): Secondary | ICD-10-CM | POA: Diagnosis not present

## 2016-11-13 DIAGNOSIS — M6281 Muscle weakness (generalized): Secondary | ICD-10-CM | POA: Diagnosis not present

## 2016-11-13 DIAGNOSIS — I110 Hypertensive heart disease with heart failure: Secondary | ICD-10-CM | POA: Diagnosis not present

## 2016-11-13 DIAGNOSIS — Z48812 Encounter for surgical aftercare following surgery on the circulatory system: Secondary | ICD-10-CM | POA: Diagnosis not present

## 2016-11-13 DIAGNOSIS — I5031 Acute diastolic (congestive) heart failure: Secondary | ICD-10-CM | POA: Diagnosis not present

## 2016-11-13 DIAGNOSIS — M1991 Primary osteoarthritis, unspecified site: Secondary | ICD-10-CM | POA: Diagnosis not present

## 2016-11-13 DIAGNOSIS — E669 Obesity, unspecified: Secondary | ICD-10-CM | POA: Diagnosis not present

## 2016-11-20 DIAGNOSIS — I110 Hypertensive heart disease with heart failure: Secondary | ICD-10-CM | POA: Diagnosis not present

## 2016-11-20 DIAGNOSIS — I5031 Acute diastolic (congestive) heart failure: Secondary | ICD-10-CM | POA: Diagnosis not present

## 2016-11-20 DIAGNOSIS — M6281 Muscle weakness (generalized): Secondary | ICD-10-CM | POA: Diagnosis not present

## 2016-11-20 DIAGNOSIS — Z48812 Encounter for surgical aftercare following surgery on the circulatory system: Secondary | ICD-10-CM | POA: Diagnosis not present

## 2016-11-20 DIAGNOSIS — E669 Obesity, unspecified: Secondary | ICD-10-CM | POA: Diagnosis not present

## 2016-11-20 DIAGNOSIS — M1991 Primary osteoarthritis, unspecified site: Secondary | ICD-10-CM | POA: Diagnosis not present

## 2016-12-11 DIAGNOSIS — Z79899 Other long term (current) drug therapy: Secondary | ICD-10-CM | POA: Diagnosis not present

## 2016-12-11 DIAGNOSIS — I10 Essential (primary) hypertension: Secondary | ICD-10-CM | POA: Diagnosis not present

## 2016-12-11 DIAGNOSIS — Z950 Presence of cardiac pacemaker: Secondary | ICD-10-CM | POA: Diagnosis not present

## 2016-12-11 DIAGNOSIS — I5030 Unspecified diastolic (congestive) heart failure: Secondary | ICD-10-CM | POA: Diagnosis not present

## 2016-12-11 DIAGNOSIS — E039 Hypothyroidism, unspecified: Secondary | ICD-10-CM | POA: Diagnosis not present

## 2016-12-11 DIAGNOSIS — M7989 Other specified soft tissue disorders: Secondary | ICD-10-CM | POA: Diagnosis not present

## 2016-12-11 DIAGNOSIS — R7309 Other abnormal glucose: Secondary | ICD-10-CM | POA: Diagnosis not present

## 2017-01-12 DIAGNOSIS — R0602 Shortness of breath: Secondary | ICD-10-CM | POA: Insufficient documentation

## 2017-01-12 DIAGNOSIS — I4430 Unspecified atrioventricular block: Secondary | ICD-10-CM | POA: Insufficient documentation

## 2017-01-12 DIAGNOSIS — R5383 Other fatigue: Secondary | ICD-10-CM | POA: Diagnosis not present

## 2017-01-12 DIAGNOSIS — I447 Left bundle-branch block, unspecified: Secondary | ICD-10-CM | POA: Diagnosis not present

## 2017-01-12 DIAGNOSIS — I442 Atrioventricular block, complete: Secondary | ICD-10-CM | POA: Insufficient documentation

## 2017-01-12 DIAGNOSIS — E039 Hypothyroidism, unspecified: Secondary | ICD-10-CM | POA: Diagnosis not present

## 2017-01-12 DIAGNOSIS — R001 Bradycardia, unspecified: Secondary | ICD-10-CM | POA: Diagnosis not present

## 2017-01-12 DIAGNOSIS — Z950 Presence of cardiac pacemaker: Secondary | ICD-10-CM | POA: Diagnosis not present

## 2017-01-12 DIAGNOSIS — R5381 Other malaise: Secondary | ICD-10-CM | POA: Insufficient documentation

## 2017-01-12 DIAGNOSIS — R609 Edema, unspecified: Secondary | ICD-10-CM | POA: Diagnosis not present

## 2017-01-12 HISTORY — DX: Atrioventricular block, complete: I44.2

## 2017-01-12 HISTORY — DX: Other malaise: R53.81

## 2017-01-12 HISTORY — DX: Shortness of breath: R06.02

## 2017-01-12 HISTORY — DX: Unspecified atrioventricular block: I44.30

## 2017-01-17 DIAGNOSIS — N183 Chronic kidney disease, stage 3 (moderate): Secondary | ICD-10-CM | POA: Diagnosis not present

## 2017-01-17 DIAGNOSIS — I10 Essential (primary) hypertension: Secondary | ICD-10-CM | POA: Diagnosis not present

## 2017-01-17 DIAGNOSIS — Z950 Presence of cardiac pacemaker: Secondary | ICD-10-CM | POA: Diagnosis not present

## 2017-01-17 DIAGNOSIS — I5030 Unspecified diastolic (congestive) heart failure: Secondary | ICD-10-CM | POA: Diagnosis not present

## 2017-01-28 DIAGNOSIS — G8929 Other chronic pain: Secondary | ICD-10-CM | POA: Diagnosis not present

## 2017-01-28 DIAGNOSIS — Z6839 Body mass index (BMI) 39.0-39.9, adult: Secondary | ICD-10-CM | POA: Diagnosis not present

## 2017-01-28 DIAGNOSIS — R079 Chest pain, unspecified: Secondary | ICD-10-CM | POA: Diagnosis not present

## 2017-01-28 DIAGNOSIS — I10 Essential (primary) hypertension: Secondary | ICD-10-CM | POA: Diagnosis not present

## 2017-01-28 DIAGNOSIS — G629 Polyneuropathy, unspecified: Secondary | ICD-10-CM | POA: Diagnosis not present

## 2017-01-28 DIAGNOSIS — M549 Dorsalgia, unspecified: Secondary | ICD-10-CM | POA: Diagnosis not present

## 2017-01-28 DIAGNOSIS — Z23 Encounter for immunization: Secondary | ICD-10-CM | POA: Diagnosis not present

## 2017-01-28 DIAGNOSIS — I110 Hypertensive heart disease with heart failure: Secondary | ICD-10-CM | POA: Diagnosis not present

## 2017-01-28 DIAGNOSIS — Z79899 Other long term (current) drug therapy: Secondary | ICD-10-CM | POA: Diagnosis not present

## 2017-01-28 DIAGNOSIS — E039 Hypothyroidism, unspecified: Secondary | ICD-10-CM | POA: Diagnosis not present

## 2017-01-28 DIAGNOSIS — K219 Gastro-esophageal reflux disease without esophagitis: Secondary | ICD-10-CM | POA: Diagnosis not present

## 2017-01-28 DIAGNOSIS — R0789 Other chest pain: Secondary | ICD-10-CM | POA: Diagnosis not present

## 2017-01-28 DIAGNOSIS — Z5309 Procedure and treatment not carried out because of other contraindication: Secondary | ICD-10-CM | POA: Diagnosis not present

## 2017-01-28 DIAGNOSIS — I509 Heart failure, unspecified: Secondary | ICD-10-CM | POA: Diagnosis not present

## 2017-01-28 DIAGNOSIS — E669 Obesity, unspecified: Secondary | ICD-10-CM | POA: Diagnosis not present

## 2017-01-28 DIAGNOSIS — Z950 Presence of cardiac pacemaker: Secondary | ICD-10-CM | POA: Diagnosis not present

## 2017-01-29 DIAGNOSIS — G629 Polyneuropathy, unspecified: Secondary | ICD-10-CM | POA: Diagnosis not present

## 2017-01-29 DIAGNOSIS — K219 Gastro-esophageal reflux disease without esophagitis: Secondary | ICD-10-CM | POA: Diagnosis not present

## 2017-01-29 DIAGNOSIS — E039 Hypothyroidism, unspecified: Secondary | ICD-10-CM | POA: Diagnosis not present

## 2017-01-29 DIAGNOSIS — E669 Obesity, unspecified: Secondary | ICD-10-CM | POA: Diagnosis not present

## 2017-01-29 DIAGNOSIS — R079 Chest pain, unspecified: Secondary | ICD-10-CM | POA: Diagnosis not present

## 2017-01-29 DIAGNOSIS — I509 Heart failure, unspecified: Secondary | ICD-10-CM | POA: Diagnosis not present

## 2017-01-29 DIAGNOSIS — I10 Essential (primary) hypertension: Secondary | ICD-10-CM | POA: Diagnosis not present

## 2017-02-05 DIAGNOSIS — J329 Chronic sinusitis, unspecified: Secondary | ICD-10-CM | POA: Diagnosis not present

## 2017-02-05 DIAGNOSIS — J40 Bronchitis, not specified as acute or chronic: Secondary | ICD-10-CM | POA: Diagnosis not present

## 2017-02-05 DIAGNOSIS — Z6841 Body Mass Index (BMI) 40.0 and over, adult: Secondary | ICD-10-CM | POA: Diagnosis not present

## 2017-02-15 DIAGNOSIS — K219 Gastro-esophageal reflux disease without esophagitis: Secondary | ICD-10-CM | POA: Diagnosis not present

## 2017-02-15 DIAGNOSIS — N289 Disorder of kidney and ureter, unspecified: Secondary | ICD-10-CM | POA: Diagnosis not present

## 2017-02-15 DIAGNOSIS — I959 Hypotension, unspecified: Secondary | ICD-10-CM | POA: Diagnosis not present

## 2017-02-15 DIAGNOSIS — I110 Hypertensive heart disease with heart failure: Secondary | ICD-10-CM | POA: Diagnosis not present

## 2017-02-15 DIAGNOSIS — I509 Heart failure, unspecified: Secondary | ICD-10-CM | POA: Diagnosis not present

## 2017-02-15 DIAGNOSIS — E860 Dehydration: Secondary | ICD-10-CM | POA: Diagnosis not present

## 2017-02-15 DIAGNOSIS — M6281 Muscle weakness (generalized): Secondary | ICD-10-CM | POA: Diagnosis not present

## 2017-02-15 DIAGNOSIS — E669 Obesity, unspecified: Secondary | ICD-10-CM | POA: Diagnosis not present

## 2017-02-15 DIAGNOSIS — I5030 Unspecified diastolic (congestive) heart failure: Secondary | ICD-10-CM | POA: Diagnosis not present

## 2017-02-15 DIAGNOSIS — E039 Hypothyroidism, unspecified: Secondary | ICD-10-CM | POA: Diagnosis not present

## 2017-02-15 DIAGNOSIS — Z79899 Other long term (current) drug therapy: Secondary | ICD-10-CM | POA: Diagnosis not present

## 2017-02-15 DIAGNOSIS — N183 Chronic kidney disease, stage 3 (moderate): Secondary | ICD-10-CM | POA: Diagnosis not present

## 2017-02-15 DIAGNOSIS — Z950 Presence of cardiac pacemaker: Secondary | ICD-10-CM | POA: Diagnosis not present

## 2017-02-15 DIAGNOSIS — Z7982 Long term (current) use of aspirin: Secondary | ICD-10-CM | POA: Diagnosis not present

## 2017-02-15 DIAGNOSIS — R7309 Other abnormal glucose: Secondary | ICD-10-CM | POA: Diagnosis not present

## 2017-02-15 DIAGNOSIS — E785 Hyperlipidemia, unspecified: Secondary | ICD-10-CM | POA: Diagnosis not present

## 2017-02-15 DIAGNOSIS — N179 Acute kidney failure, unspecified: Secondary | ICD-10-CM | POA: Diagnosis not present

## 2017-02-15 DIAGNOSIS — R531 Weakness: Secondary | ICD-10-CM | POA: Diagnosis not present

## 2017-02-15 DIAGNOSIS — Z66 Do not resuscitate: Secondary | ICD-10-CM | POA: Diagnosis not present

## 2017-02-15 DIAGNOSIS — E871 Hypo-osmolality and hyponatremia: Secondary | ICD-10-CM | POA: Diagnosis not present

## 2017-02-15 DIAGNOSIS — I10 Essential (primary) hypertension: Secondary | ICD-10-CM | POA: Diagnosis not present

## 2017-02-15 DIAGNOSIS — M549 Dorsalgia, unspecified: Secondary | ICD-10-CM | POA: Diagnosis not present

## 2017-02-15 DIAGNOSIS — R5381 Other malaise: Secondary | ICD-10-CM | POA: Diagnosis not present

## 2017-02-16 DIAGNOSIS — M549 Dorsalgia, unspecified: Secondary | ICD-10-CM | POA: Diagnosis not present

## 2017-02-16 DIAGNOSIS — K219 Gastro-esophageal reflux disease without esophagitis: Secondary | ICD-10-CM | POA: Diagnosis not present

## 2017-02-16 DIAGNOSIS — I10 Essential (primary) hypertension: Secondary | ICD-10-CM | POA: Diagnosis not present

## 2017-02-16 DIAGNOSIS — E039 Hypothyroidism, unspecified: Secondary | ICD-10-CM | POA: Diagnosis not present

## 2017-02-16 DIAGNOSIS — E669 Obesity, unspecified: Secondary | ICD-10-CM | POA: Diagnosis not present

## 2017-02-16 DIAGNOSIS — E871 Hypo-osmolality and hyponatremia: Secondary | ICD-10-CM | POA: Diagnosis not present

## 2017-02-16 DIAGNOSIS — I509 Heart failure, unspecified: Secondary | ICD-10-CM | POA: Diagnosis not present

## 2017-02-16 DIAGNOSIS — N289 Disorder of kidney and ureter, unspecified: Secondary | ICD-10-CM | POA: Diagnosis not present

## 2017-02-19 DIAGNOSIS — E669 Obesity, unspecified: Secondary | ICD-10-CM | POA: Diagnosis not present

## 2017-02-19 DIAGNOSIS — G8929 Other chronic pain: Secondary | ICD-10-CM | POA: Diagnosis not present

## 2017-02-19 DIAGNOSIS — Z7982 Long term (current) use of aspirin: Secondary | ICD-10-CM | POA: Diagnosis not present

## 2017-02-19 DIAGNOSIS — K219 Gastro-esophageal reflux disease without esophagitis: Secondary | ICD-10-CM | POA: Diagnosis not present

## 2017-02-19 DIAGNOSIS — M159 Polyosteoarthritis, unspecified: Secondary | ICD-10-CM | POA: Diagnosis not present

## 2017-02-19 DIAGNOSIS — Z950 Presence of cardiac pacemaker: Secondary | ICD-10-CM | POA: Diagnosis not present

## 2017-02-19 DIAGNOSIS — Z6839 Body mass index (BMI) 39.0-39.9, adult: Secondary | ICD-10-CM | POA: Diagnosis not present

## 2017-02-19 DIAGNOSIS — F419 Anxiety disorder, unspecified: Secondary | ICD-10-CM | POA: Diagnosis not present

## 2017-02-19 DIAGNOSIS — G629 Polyneuropathy, unspecified: Secondary | ICD-10-CM | POA: Diagnosis not present

## 2017-02-19 DIAGNOSIS — I110 Hypertensive heart disease with heart failure: Secondary | ICD-10-CM | POA: Diagnosis not present

## 2017-02-19 DIAGNOSIS — E7800 Pure hypercholesterolemia, unspecified: Secondary | ICD-10-CM | POA: Diagnosis not present

## 2017-02-19 DIAGNOSIS — M6281 Muscle weakness (generalized): Secondary | ICD-10-CM | POA: Diagnosis not present

## 2017-02-19 DIAGNOSIS — M549 Dorsalgia, unspecified: Secondary | ICD-10-CM | POA: Diagnosis not present

## 2017-02-19 DIAGNOSIS — I5032 Chronic diastolic (congestive) heart failure: Secondary | ICD-10-CM | POA: Diagnosis not present

## 2017-02-21 DIAGNOSIS — K219 Gastro-esophageal reflux disease without esophagitis: Secondary | ICD-10-CM | POA: Diagnosis not present

## 2017-02-21 DIAGNOSIS — M159 Polyosteoarthritis, unspecified: Secondary | ICD-10-CM | POA: Diagnosis not present

## 2017-02-21 DIAGNOSIS — I5032 Chronic diastolic (congestive) heart failure: Secondary | ICD-10-CM | POA: Diagnosis not present

## 2017-02-21 DIAGNOSIS — M549 Dorsalgia, unspecified: Secondary | ICD-10-CM | POA: Diagnosis not present

## 2017-02-21 DIAGNOSIS — M6281 Muscle weakness (generalized): Secondary | ICD-10-CM | POA: Diagnosis not present

## 2017-02-21 DIAGNOSIS — I110 Hypertensive heart disease with heart failure: Secondary | ICD-10-CM | POA: Diagnosis not present

## 2017-02-22 DIAGNOSIS — M549 Dorsalgia, unspecified: Secondary | ICD-10-CM | POA: Diagnosis not present

## 2017-02-22 DIAGNOSIS — I110 Hypertensive heart disease with heart failure: Secondary | ICD-10-CM | POA: Diagnosis not present

## 2017-02-22 DIAGNOSIS — K219 Gastro-esophageal reflux disease without esophagitis: Secondary | ICD-10-CM | POA: Diagnosis not present

## 2017-02-22 DIAGNOSIS — I5032 Chronic diastolic (congestive) heart failure: Secondary | ICD-10-CM | POA: Diagnosis not present

## 2017-02-22 DIAGNOSIS — M159 Polyosteoarthritis, unspecified: Secondary | ICD-10-CM | POA: Diagnosis not present

## 2017-02-22 DIAGNOSIS — M6281 Muscle weakness (generalized): Secondary | ICD-10-CM | POA: Diagnosis not present

## 2017-02-23 DIAGNOSIS — I110 Hypertensive heart disease with heart failure: Secondary | ICD-10-CM | POA: Diagnosis not present

## 2017-02-23 DIAGNOSIS — M6281 Muscle weakness (generalized): Secondary | ICD-10-CM | POA: Diagnosis not present

## 2017-02-23 DIAGNOSIS — I5032 Chronic diastolic (congestive) heart failure: Secondary | ICD-10-CM | POA: Diagnosis not present

## 2017-02-23 DIAGNOSIS — M159 Polyosteoarthritis, unspecified: Secondary | ICD-10-CM | POA: Diagnosis not present

## 2017-02-23 DIAGNOSIS — K219 Gastro-esophageal reflux disease without esophagitis: Secondary | ICD-10-CM | POA: Diagnosis not present

## 2017-02-23 DIAGNOSIS — M549 Dorsalgia, unspecified: Secondary | ICD-10-CM | POA: Diagnosis not present

## 2017-02-26 DIAGNOSIS — M6281 Muscle weakness (generalized): Secondary | ICD-10-CM | POA: Diagnosis not present

## 2017-02-26 DIAGNOSIS — I5032 Chronic diastolic (congestive) heart failure: Secondary | ICD-10-CM | POA: Diagnosis not present

## 2017-02-26 DIAGNOSIS — M159 Polyosteoarthritis, unspecified: Secondary | ICD-10-CM | POA: Diagnosis not present

## 2017-02-26 DIAGNOSIS — K219 Gastro-esophageal reflux disease without esophagitis: Secondary | ICD-10-CM | POA: Diagnosis not present

## 2017-02-26 DIAGNOSIS — M549 Dorsalgia, unspecified: Secondary | ICD-10-CM | POA: Diagnosis not present

## 2017-02-26 DIAGNOSIS — I110 Hypertensive heart disease with heart failure: Secondary | ICD-10-CM | POA: Diagnosis not present

## 2017-03-01 DIAGNOSIS — M6281 Muscle weakness (generalized): Secondary | ICD-10-CM | POA: Diagnosis not present

## 2017-03-01 DIAGNOSIS — M549 Dorsalgia, unspecified: Secondary | ICD-10-CM | POA: Diagnosis not present

## 2017-03-01 DIAGNOSIS — M159 Polyosteoarthritis, unspecified: Secondary | ICD-10-CM | POA: Diagnosis not present

## 2017-03-01 DIAGNOSIS — K219 Gastro-esophageal reflux disease without esophagitis: Secondary | ICD-10-CM | POA: Diagnosis not present

## 2017-03-01 DIAGNOSIS — I5032 Chronic diastolic (congestive) heart failure: Secondary | ICD-10-CM | POA: Diagnosis not present

## 2017-03-01 DIAGNOSIS — I110 Hypertensive heart disease with heart failure: Secondary | ICD-10-CM | POA: Diagnosis not present

## 2017-03-05 DIAGNOSIS — K219 Gastro-esophageal reflux disease without esophagitis: Secondary | ICD-10-CM | POA: Diagnosis not present

## 2017-03-05 DIAGNOSIS — M6281 Muscle weakness (generalized): Secondary | ICD-10-CM | POA: Diagnosis not present

## 2017-03-05 DIAGNOSIS — I5032 Chronic diastolic (congestive) heart failure: Secondary | ICD-10-CM | POA: Diagnosis not present

## 2017-03-05 DIAGNOSIS — I110 Hypertensive heart disease with heart failure: Secondary | ICD-10-CM | POA: Diagnosis not present

## 2017-03-05 DIAGNOSIS — M549 Dorsalgia, unspecified: Secondary | ICD-10-CM | POA: Diagnosis not present

## 2017-03-05 DIAGNOSIS — M159 Polyosteoarthritis, unspecified: Secondary | ICD-10-CM | POA: Diagnosis not present

## 2017-03-09 DIAGNOSIS — I5032 Chronic diastolic (congestive) heart failure: Secondary | ICD-10-CM | POA: Diagnosis not present

## 2017-03-09 DIAGNOSIS — I110 Hypertensive heart disease with heart failure: Secondary | ICD-10-CM | POA: Diagnosis not present

## 2017-03-09 DIAGNOSIS — M6281 Muscle weakness (generalized): Secondary | ICD-10-CM | POA: Diagnosis not present

## 2017-03-09 DIAGNOSIS — M159 Polyosteoarthritis, unspecified: Secondary | ICD-10-CM | POA: Diagnosis not present

## 2017-03-09 DIAGNOSIS — K219 Gastro-esophageal reflux disease without esophagitis: Secondary | ICD-10-CM | POA: Diagnosis not present

## 2017-03-09 DIAGNOSIS — M549 Dorsalgia, unspecified: Secondary | ICD-10-CM | POA: Diagnosis not present

## 2017-03-12 DIAGNOSIS — M159 Polyosteoarthritis, unspecified: Secondary | ICD-10-CM | POA: Diagnosis not present

## 2017-03-12 DIAGNOSIS — M549 Dorsalgia, unspecified: Secondary | ICD-10-CM | POA: Diagnosis not present

## 2017-03-12 DIAGNOSIS — I5032 Chronic diastolic (congestive) heart failure: Secondary | ICD-10-CM | POA: Diagnosis not present

## 2017-03-12 DIAGNOSIS — I110 Hypertensive heart disease with heart failure: Secondary | ICD-10-CM | POA: Diagnosis not present

## 2017-03-12 DIAGNOSIS — M6281 Muscle weakness (generalized): Secondary | ICD-10-CM | POA: Diagnosis not present

## 2017-03-12 DIAGNOSIS — K219 Gastro-esophageal reflux disease without esophagitis: Secondary | ICD-10-CM | POA: Diagnosis not present

## 2017-03-16 DIAGNOSIS — K219 Gastro-esophageal reflux disease without esophagitis: Secondary | ICD-10-CM | POA: Diagnosis not present

## 2017-03-16 DIAGNOSIS — M549 Dorsalgia, unspecified: Secondary | ICD-10-CM | POA: Diagnosis not present

## 2017-03-16 DIAGNOSIS — I110 Hypertensive heart disease with heart failure: Secondary | ICD-10-CM | POA: Diagnosis not present

## 2017-03-16 DIAGNOSIS — M6281 Muscle weakness (generalized): Secondary | ICD-10-CM | POA: Diagnosis not present

## 2017-03-16 DIAGNOSIS — I5032 Chronic diastolic (congestive) heart failure: Secondary | ICD-10-CM | POA: Diagnosis not present

## 2017-03-16 DIAGNOSIS — M159 Polyosteoarthritis, unspecified: Secondary | ICD-10-CM | POA: Diagnosis not present

## 2017-03-19 DIAGNOSIS — K219 Gastro-esophageal reflux disease without esophagitis: Secondary | ICD-10-CM | POA: Diagnosis not present

## 2017-03-19 DIAGNOSIS — M159 Polyosteoarthritis, unspecified: Secondary | ICD-10-CM | POA: Diagnosis not present

## 2017-03-19 DIAGNOSIS — M549 Dorsalgia, unspecified: Secondary | ICD-10-CM | POA: Diagnosis not present

## 2017-03-19 DIAGNOSIS — M6281 Muscle weakness (generalized): Secondary | ICD-10-CM | POA: Diagnosis not present

## 2017-03-19 DIAGNOSIS — I110 Hypertensive heart disease with heart failure: Secondary | ICD-10-CM | POA: Diagnosis not present

## 2017-03-19 DIAGNOSIS — I5032 Chronic diastolic (congestive) heart failure: Secondary | ICD-10-CM | POA: Diagnosis not present

## 2017-03-21 DIAGNOSIS — I110 Hypertensive heart disease with heart failure: Secondary | ICD-10-CM | POA: Diagnosis not present

## 2017-03-21 DIAGNOSIS — M6281 Muscle weakness (generalized): Secondary | ICD-10-CM | POA: Diagnosis not present

## 2017-03-21 DIAGNOSIS — I5032 Chronic diastolic (congestive) heart failure: Secondary | ICD-10-CM | POA: Diagnosis not present

## 2017-03-21 DIAGNOSIS — M159 Polyosteoarthritis, unspecified: Secondary | ICD-10-CM | POA: Diagnosis not present

## 2017-03-21 DIAGNOSIS — K219 Gastro-esophageal reflux disease without esophagitis: Secondary | ICD-10-CM | POA: Diagnosis not present

## 2017-03-21 DIAGNOSIS — M549 Dorsalgia, unspecified: Secondary | ICD-10-CM | POA: Diagnosis not present

## 2017-03-23 DIAGNOSIS — K219 Gastro-esophageal reflux disease without esophagitis: Secondary | ICD-10-CM | POA: Diagnosis not present

## 2017-03-23 DIAGNOSIS — I110 Hypertensive heart disease with heart failure: Secondary | ICD-10-CM | POA: Diagnosis not present

## 2017-03-23 DIAGNOSIS — M159 Polyosteoarthritis, unspecified: Secondary | ICD-10-CM | POA: Diagnosis not present

## 2017-03-23 DIAGNOSIS — M549 Dorsalgia, unspecified: Secondary | ICD-10-CM | POA: Diagnosis not present

## 2017-03-23 DIAGNOSIS — M6281 Muscle weakness (generalized): Secondary | ICD-10-CM | POA: Diagnosis not present

## 2017-03-23 DIAGNOSIS — I5032 Chronic diastolic (congestive) heart failure: Secondary | ICD-10-CM | POA: Diagnosis not present

## 2017-03-26 DIAGNOSIS — Z6841 Body Mass Index (BMI) 40.0 and over, adult: Secondary | ICD-10-CM | POA: Diagnosis not present

## 2017-03-26 DIAGNOSIS — I5032 Chronic diastolic (congestive) heart failure: Secondary | ICD-10-CM | POA: Diagnosis not present

## 2017-03-26 DIAGNOSIS — J329 Chronic sinusitis, unspecified: Secondary | ICD-10-CM | POA: Diagnosis not present

## 2017-03-26 DIAGNOSIS — M159 Polyosteoarthritis, unspecified: Secondary | ICD-10-CM | POA: Diagnosis not present

## 2017-03-26 DIAGNOSIS — M6281 Muscle weakness (generalized): Secondary | ICD-10-CM | POA: Diagnosis not present

## 2017-03-26 DIAGNOSIS — K219 Gastro-esophageal reflux disease without esophagitis: Secondary | ICD-10-CM | POA: Diagnosis not present

## 2017-03-26 DIAGNOSIS — I110 Hypertensive heart disease with heart failure: Secondary | ICD-10-CM | POA: Diagnosis not present

## 2017-03-26 DIAGNOSIS — J40 Bronchitis, not specified as acute or chronic: Secondary | ICD-10-CM | POA: Diagnosis not present

## 2017-03-26 DIAGNOSIS — M549 Dorsalgia, unspecified: Secondary | ICD-10-CM | POA: Diagnosis not present

## 2017-03-29 DIAGNOSIS — I110 Hypertensive heart disease with heart failure: Secondary | ICD-10-CM | POA: Diagnosis not present

## 2017-03-29 DIAGNOSIS — K219 Gastro-esophageal reflux disease without esophagitis: Secondary | ICD-10-CM | POA: Diagnosis not present

## 2017-03-29 DIAGNOSIS — M549 Dorsalgia, unspecified: Secondary | ICD-10-CM | POA: Diagnosis not present

## 2017-03-29 DIAGNOSIS — M159 Polyosteoarthritis, unspecified: Secondary | ICD-10-CM | POA: Diagnosis not present

## 2017-03-29 DIAGNOSIS — M6281 Muscle weakness (generalized): Secondary | ICD-10-CM | POA: Diagnosis not present

## 2017-03-29 DIAGNOSIS — I5032 Chronic diastolic (congestive) heart failure: Secondary | ICD-10-CM | POA: Diagnosis not present

## 2017-04-02 DIAGNOSIS — M549 Dorsalgia, unspecified: Secondary | ICD-10-CM | POA: Diagnosis not present

## 2017-04-02 DIAGNOSIS — I110 Hypertensive heart disease with heart failure: Secondary | ICD-10-CM | POA: Diagnosis not present

## 2017-04-02 DIAGNOSIS — K219 Gastro-esophageal reflux disease without esophagitis: Secondary | ICD-10-CM | POA: Diagnosis not present

## 2017-04-02 DIAGNOSIS — M159 Polyosteoarthritis, unspecified: Secondary | ICD-10-CM | POA: Diagnosis not present

## 2017-04-02 DIAGNOSIS — I5032 Chronic diastolic (congestive) heart failure: Secondary | ICD-10-CM | POA: Diagnosis not present

## 2017-04-02 DIAGNOSIS — M6281 Muscle weakness (generalized): Secondary | ICD-10-CM | POA: Diagnosis not present

## 2017-04-04 DIAGNOSIS — M6281 Muscle weakness (generalized): Secondary | ICD-10-CM | POA: Diagnosis not present

## 2017-04-04 DIAGNOSIS — K219 Gastro-esophageal reflux disease without esophagitis: Secondary | ICD-10-CM | POA: Diagnosis not present

## 2017-04-04 DIAGNOSIS — I5032 Chronic diastolic (congestive) heart failure: Secondary | ICD-10-CM | POA: Diagnosis not present

## 2017-04-04 DIAGNOSIS — M159 Polyosteoarthritis, unspecified: Secondary | ICD-10-CM | POA: Diagnosis not present

## 2017-04-04 DIAGNOSIS — M549 Dorsalgia, unspecified: Secondary | ICD-10-CM | POA: Diagnosis not present

## 2017-04-04 DIAGNOSIS — I110 Hypertensive heart disease with heart failure: Secondary | ICD-10-CM | POA: Diagnosis not present

## 2017-04-08 DIAGNOSIS — M549 Dorsalgia, unspecified: Secondary | ICD-10-CM | POA: Diagnosis not present

## 2017-04-08 DIAGNOSIS — M6281 Muscle weakness (generalized): Secondary | ICD-10-CM | POA: Diagnosis not present

## 2017-04-08 DIAGNOSIS — I5032 Chronic diastolic (congestive) heart failure: Secondary | ICD-10-CM | POA: Diagnosis not present

## 2017-04-08 DIAGNOSIS — I110 Hypertensive heart disease with heart failure: Secondary | ICD-10-CM | POA: Diagnosis not present

## 2017-04-08 DIAGNOSIS — K219 Gastro-esophageal reflux disease without esophagitis: Secondary | ICD-10-CM | POA: Diagnosis not present

## 2017-04-08 DIAGNOSIS — M159 Polyosteoarthritis, unspecified: Secondary | ICD-10-CM | POA: Diagnosis not present

## 2017-04-09 DIAGNOSIS — I5032 Chronic diastolic (congestive) heart failure: Secondary | ICD-10-CM | POA: Diagnosis not present

## 2017-04-09 DIAGNOSIS — K219 Gastro-esophageal reflux disease without esophagitis: Secondary | ICD-10-CM | POA: Diagnosis not present

## 2017-04-09 DIAGNOSIS — I110 Hypertensive heart disease with heart failure: Secondary | ICD-10-CM | POA: Diagnosis not present

## 2017-04-09 DIAGNOSIS — M6281 Muscle weakness (generalized): Secondary | ICD-10-CM | POA: Diagnosis not present

## 2017-04-09 DIAGNOSIS — M549 Dorsalgia, unspecified: Secondary | ICD-10-CM | POA: Diagnosis not present

## 2017-04-09 DIAGNOSIS — M159 Polyosteoarthritis, unspecified: Secondary | ICD-10-CM | POA: Diagnosis not present

## 2017-04-23 DIAGNOSIS — R21 Rash and other nonspecific skin eruption: Secondary | ICD-10-CM | POA: Diagnosis not present

## 2017-04-23 DIAGNOSIS — Z6841 Body Mass Index (BMI) 40.0 and over, adult: Secondary | ICD-10-CM | POA: Diagnosis not present

## 2017-04-26 DIAGNOSIS — Z45018 Encounter for adjustment and management of other part of cardiac pacemaker: Secondary | ICD-10-CM | POA: Diagnosis not present

## 2017-04-26 DIAGNOSIS — I447 Left bundle-branch block, unspecified: Secondary | ICD-10-CM | POA: Diagnosis not present

## 2017-05-16 DIAGNOSIS — R05 Cough: Secondary | ICD-10-CM | POA: Diagnosis not present

## 2017-05-16 DIAGNOSIS — R0989 Other specified symptoms and signs involving the circulatory and respiratory systems: Secondary | ICD-10-CM | POA: Diagnosis not present

## 2017-05-16 DIAGNOSIS — J111 Influenza due to unidentified influenza virus with other respiratory manifestations: Secondary | ICD-10-CM | POA: Diagnosis not present

## 2017-05-21 DIAGNOSIS — J40 Bronchitis, not specified as acute or chronic: Secondary | ICD-10-CM | POA: Diagnosis not present

## 2017-05-21 DIAGNOSIS — I10 Essential (primary) hypertension: Secondary | ICD-10-CM | POA: Diagnosis not present

## 2017-05-21 DIAGNOSIS — R7309 Other abnormal glucose: Secondary | ICD-10-CM | POA: Diagnosis not present

## 2017-05-21 DIAGNOSIS — Z79899 Other long term (current) drug therapy: Secondary | ICD-10-CM | POA: Diagnosis not present

## 2017-05-21 DIAGNOSIS — E785 Hyperlipidemia, unspecified: Secondary | ICD-10-CM | POA: Diagnosis not present

## 2017-05-21 DIAGNOSIS — K219 Gastro-esophageal reflux disease without esophagitis: Secondary | ICD-10-CM | POA: Diagnosis not present

## 2017-05-21 DIAGNOSIS — I5030 Unspecified diastolic (congestive) heart failure: Secondary | ICD-10-CM | POA: Diagnosis not present

## 2017-06-19 DIAGNOSIS — Z6841 Body Mass Index (BMI) 40.0 and over, adult: Secondary | ICD-10-CM | POA: Diagnosis not present

## 2017-06-19 DIAGNOSIS — K30 Functional dyspepsia: Secondary | ICD-10-CM | POA: Diagnosis not present

## 2017-06-19 DIAGNOSIS — R42 Dizziness and giddiness: Secondary | ICD-10-CM | POA: Diagnosis not present

## 2017-06-24 DIAGNOSIS — I110 Hypertensive heart disease with heart failure: Secondary | ICD-10-CM | POA: Diagnosis not present

## 2017-06-24 DIAGNOSIS — E7800 Pure hypercholesterolemia, unspecified: Secondary | ICD-10-CM | POA: Diagnosis not present

## 2017-06-24 DIAGNOSIS — R1084 Generalized abdominal pain: Secondary | ICD-10-CM | POA: Diagnosis not present

## 2017-06-24 DIAGNOSIS — I509 Heart failure, unspecified: Secondary | ICD-10-CM | POA: Diagnosis not present

## 2017-06-24 DIAGNOSIS — K30 Functional dyspepsia: Secondary | ICD-10-CM | POA: Diagnosis not present

## 2017-06-24 DIAGNOSIS — R0789 Other chest pain: Secondary | ICD-10-CM | POA: Diagnosis not present

## 2017-06-24 DIAGNOSIS — Z950 Presence of cardiac pacemaker: Secondary | ICD-10-CM | POA: Diagnosis not present

## 2017-06-24 DIAGNOSIS — K219 Gastro-esophageal reflux disease without esophagitis: Secondary | ICD-10-CM | POA: Diagnosis not present

## 2017-07-10 DIAGNOSIS — K1120 Sialoadenitis, unspecified: Secondary | ICD-10-CM | POA: Diagnosis not present

## 2017-07-10 DIAGNOSIS — K219 Gastro-esophageal reflux disease without esophagitis: Secondary | ICD-10-CM | POA: Diagnosis not present

## 2017-07-10 DIAGNOSIS — Z6841 Body Mass Index (BMI) 40.0 and over, adult: Secondary | ICD-10-CM | POA: Diagnosis not present

## 2017-08-27 DIAGNOSIS — H353111 Nonexudative age-related macular degeneration, right eye, early dry stage: Secondary | ICD-10-CM | POA: Diagnosis not present

## 2017-08-27 DIAGNOSIS — H26493 Other secondary cataract, bilateral: Secondary | ICD-10-CM | POA: Diagnosis not present

## 2017-09-12 DIAGNOSIS — I10 Essential (primary) hypertension: Secondary | ICD-10-CM | POA: Diagnosis not present

## 2017-09-12 DIAGNOSIS — Z1331 Encounter for screening for depression: Secondary | ICD-10-CM | POA: Diagnosis not present

## 2017-09-12 DIAGNOSIS — Z79899 Other long term (current) drug therapy: Secondary | ICD-10-CM | POA: Diagnosis not present

## 2017-09-12 DIAGNOSIS — N183 Chronic kidney disease, stage 3 (moderate): Secondary | ICD-10-CM | POA: Diagnosis not present

## 2017-09-12 DIAGNOSIS — E785 Hyperlipidemia, unspecified: Secondary | ICD-10-CM | POA: Diagnosis not present

## 2017-09-12 DIAGNOSIS — Z0000 Encounter for general adult medical examination without abnormal findings: Secondary | ICD-10-CM | POA: Diagnosis not present

## 2017-09-12 DIAGNOSIS — E039 Hypothyroidism, unspecified: Secondary | ICD-10-CM | POA: Diagnosis not present

## 2017-09-12 DIAGNOSIS — I5030 Unspecified diastolic (congestive) heart failure: Secondary | ICD-10-CM | POA: Diagnosis not present

## 2017-09-12 DIAGNOSIS — R7309 Other abnormal glucose: Secondary | ICD-10-CM | POA: Diagnosis not present

## 2017-09-24 DIAGNOSIS — J988 Other specified respiratory disorders: Secondary | ICD-10-CM | POA: Diagnosis not present

## 2017-09-24 DIAGNOSIS — Z6841 Body Mass Index (BMI) 40.0 and over, adult: Secondary | ICD-10-CM | POA: Diagnosis not present

## 2017-10-08 DIAGNOSIS — Z66 Do not resuscitate: Secondary | ICD-10-CM | POA: Diagnosis not present

## 2017-10-08 DIAGNOSIS — I5033 Acute on chronic diastolic (congestive) heart failure: Secondary | ICD-10-CM | POA: Diagnosis not present

## 2017-10-08 DIAGNOSIS — R0689 Other abnormalities of breathing: Secondary | ICD-10-CM | POA: Diagnosis not present

## 2017-10-08 DIAGNOSIS — I5189 Other ill-defined heart diseases: Secondary | ICD-10-CM | POA: Diagnosis not present

## 2017-10-08 DIAGNOSIS — R0902 Hypoxemia: Secondary | ICD-10-CM | POA: Diagnosis not present

## 2017-10-08 DIAGNOSIS — K219 Gastro-esophageal reflux disease without esophagitis: Secondary | ICD-10-CM | POA: Diagnosis not present

## 2017-10-08 DIAGNOSIS — Z6841 Body Mass Index (BMI) 40.0 and over, adult: Secondary | ICD-10-CM | POA: Diagnosis not present

## 2017-10-08 DIAGNOSIS — R0789 Other chest pain: Secondary | ICD-10-CM | POA: Diagnosis not present

## 2017-10-08 DIAGNOSIS — J209 Acute bronchitis, unspecified: Secondary | ICD-10-CM | POA: Diagnosis not present

## 2017-10-08 DIAGNOSIS — J45909 Unspecified asthma, uncomplicated: Secondary | ICD-10-CM | POA: Diagnosis not present

## 2017-10-08 DIAGNOSIS — Z79899 Other long term (current) drug therapy: Secondary | ICD-10-CM | POA: Diagnosis not present

## 2017-10-08 DIAGNOSIS — I110 Hypertensive heart disease with heart failure: Secondary | ICD-10-CM | POA: Diagnosis not present

## 2017-10-08 DIAGNOSIS — Z7982 Long term (current) use of aspirin: Secondary | ICD-10-CM | POA: Diagnosis not present

## 2017-10-08 DIAGNOSIS — E039 Hypothyroidism, unspecified: Secondary | ICD-10-CM | POA: Diagnosis not present

## 2017-10-08 DIAGNOSIS — R079 Chest pain, unspecified: Secondary | ICD-10-CM | POA: Diagnosis not present

## 2017-10-08 DIAGNOSIS — E876 Hypokalemia: Secondary | ICD-10-CM | POA: Diagnosis not present

## 2017-10-09 DIAGNOSIS — R0902 Hypoxemia: Secondary | ICD-10-CM | POA: Diagnosis not present

## 2017-10-09 DIAGNOSIS — J209 Acute bronchitis, unspecified: Secondary | ICD-10-CM | POA: Diagnosis not present

## 2017-10-09 DIAGNOSIS — R079 Chest pain, unspecified: Secondary | ICD-10-CM | POA: Diagnosis not present

## 2017-10-12 DIAGNOSIS — R0989 Other specified symptoms and signs involving the circulatory and respiratory systems: Secondary | ICD-10-CM | POA: Diagnosis not present

## 2017-10-12 DIAGNOSIS — R079 Chest pain, unspecified: Secondary | ICD-10-CM | POA: Diagnosis not present

## 2017-10-12 DIAGNOSIS — R531 Weakness: Secondary | ICD-10-CM | POA: Diagnosis not present

## 2017-10-12 DIAGNOSIS — E871 Hypo-osmolality and hyponatremia: Secondary | ICD-10-CM | POA: Diagnosis not present

## 2017-10-12 DIAGNOSIS — R404 Transient alteration of awareness: Secondary | ICD-10-CM | POA: Diagnosis not present

## 2017-10-13 DIAGNOSIS — M1990 Unspecified osteoarthritis, unspecified site: Secondary | ICD-10-CM | POA: Diagnosis present

## 2017-10-13 DIAGNOSIS — E119 Type 2 diabetes mellitus without complications: Secondary | ICD-10-CM | POA: Diagnosis present

## 2017-10-13 DIAGNOSIS — Z7982 Long term (current) use of aspirin: Secondary | ICD-10-CM | POA: Diagnosis not present

## 2017-10-13 DIAGNOSIS — E7800 Pure hypercholesterolemia, unspecified: Secondary | ICD-10-CM | POA: Diagnosis present

## 2017-10-13 DIAGNOSIS — I110 Hypertensive heart disease with heart failure: Secondary | ICD-10-CM | POA: Diagnosis not present

## 2017-10-13 DIAGNOSIS — Z79899 Other long term (current) drug therapy: Secondary | ICD-10-CM | POA: Diagnosis not present

## 2017-10-13 DIAGNOSIS — I509 Heart failure, unspecified: Secondary | ICD-10-CM | POA: Diagnosis present

## 2017-10-13 DIAGNOSIS — R531 Weakness: Secondary | ICD-10-CM | POA: Diagnosis not present

## 2017-10-13 DIAGNOSIS — E871 Hypo-osmolality and hyponatremia: Secondary | ICD-10-CM | POA: Diagnosis not present

## 2017-10-13 DIAGNOSIS — R079 Chest pain, unspecified: Secondary | ICD-10-CM | POA: Diagnosis not present

## 2017-10-13 DIAGNOSIS — Z950 Presence of cardiac pacemaker: Secondary | ICD-10-CM | POA: Diagnosis not present

## 2017-10-13 DIAGNOSIS — E039 Hypothyroidism, unspecified: Secondary | ICD-10-CM | POA: Diagnosis present

## 2017-10-13 DIAGNOSIS — Z66 Do not resuscitate: Secondary | ICD-10-CM | POA: Diagnosis present

## 2017-10-13 DIAGNOSIS — K219 Gastro-esophageal reflux disease without esophagitis: Secondary | ICD-10-CM | POA: Diagnosis not present

## 2017-10-17 DIAGNOSIS — Z950 Presence of cardiac pacemaker: Secondary | ICD-10-CM | POA: Diagnosis not present

## 2017-10-17 DIAGNOSIS — R609 Edema, unspecified: Secondary | ICD-10-CM | POA: Diagnosis not present

## 2017-10-17 DIAGNOSIS — I447 Left bundle-branch block, unspecified: Secondary | ICD-10-CM | POA: Diagnosis not present

## 2017-10-19 DIAGNOSIS — R531 Weakness: Secondary | ICD-10-CM | POA: Diagnosis not present

## 2017-10-19 DIAGNOSIS — Z6839 Body mass index (BMI) 39.0-39.9, adult: Secondary | ICD-10-CM | POA: Diagnosis not present

## 2017-10-19 DIAGNOSIS — K219 Gastro-esophageal reflux disease without esophagitis: Secondary | ICD-10-CM | POA: Diagnosis not present

## 2017-10-19 DIAGNOSIS — I5032 Chronic diastolic (congestive) heart failure: Secondary | ICD-10-CM | POA: Diagnosis not present

## 2017-10-19 DIAGNOSIS — E669 Obesity, unspecified: Secondary | ICD-10-CM | POA: Diagnosis not present

## 2017-10-19 DIAGNOSIS — M549 Dorsalgia, unspecified: Secondary | ICD-10-CM | POA: Diagnosis not present

## 2017-10-19 DIAGNOSIS — F419 Anxiety disorder, unspecified: Secondary | ICD-10-CM | POA: Diagnosis not present

## 2017-10-19 DIAGNOSIS — Z7982 Long term (current) use of aspirin: Secondary | ICD-10-CM | POA: Diagnosis not present

## 2017-10-19 DIAGNOSIS — Z950 Presence of cardiac pacemaker: Secondary | ICD-10-CM | POA: Diagnosis not present

## 2017-10-19 DIAGNOSIS — G8929 Other chronic pain: Secondary | ICD-10-CM | POA: Diagnosis not present

## 2017-10-19 DIAGNOSIS — E7800 Pure hypercholesterolemia, unspecified: Secondary | ICD-10-CM | POA: Diagnosis not present

## 2017-10-19 DIAGNOSIS — G629 Polyneuropathy, unspecified: Secondary | ICD-10-CM | POA: Diagnosis not present

## 2017-10-19 DIAGNOSIS — I130 Hypertensive heart and chronic kidney disease with heart failure and stage 1 through stage 4 chronic kidney disease, or unspecified chronic kidney disease: Secondary | ICD-10-CM | POA: Diagnosis not present

## 2017-10-19 DIAGNOSIS — M159 Polyosteoarthritis, unspecified: Secondary | ICD-10-CM | POA: Diagnosis not present

## 2017-10-19 DIAGNOSIS — E871 Hypo-osmolality and hyponatremia: Secondary | ICD-10-CM | POA: Diagnosis not present

## 2017-10-19 DIAGNOSIS — N183 Chronic kidney disease, stage 3 (moderate): Secondary | ICD-10-CM | POA: Diagnosis not present

## 2017-10-22 DIAGNOSIS — Z6841 Body Mass Index (BMI) 40.0 and over, adult: Secondary | ICD-10-CM | POA: Diagnosis not present

## 2017-10-22 DIAGNOSIS — E871 Hypo-osmolality and hyponatremia: Secondary | ICD-10-CM | POA: Diagnosis not present

## 2017-10-22 DIAGNOSIS — E039 Hypothyroidism, unspecified: Secondary | ICD-10-CM | POA: Diagnosis not present

## 2017-10-22 DIAGNOSIS — R05 Cough: Secondary | ICD-10-CM | POA: Diagnosis not present

## 2017-10-24 DIAGNOSIS — I5032 Chronic diastolic (congestive) heart failure: Secondary | ICD-10-CM | POA: Diagnosis not present

## 2017-10-24 DIAGNOSIS — R531 Weakness: Secondary | ICD-10-CM | POA: Diagnosis not present

## 2017-10-24 DIAGNOSIS — I130 Hypertensive heart and chronic kidney disease with heart failure and stage 1 through stage 4 chronic kidney disease, or unspecified chronic kidney disease: Secondary | ICD-10-CM | POA: Diagnosis not present

## 2017-10-24 DIAGNOSIS — N183 Chronic kidney disease, stage 3 (moderate): Secondary | ICD-10-CM | POA: Diagnosis not present

## 2017-10-24 DIAGNOSIS — G629 Polyneuropathy, unspecified: Secondary | ICD-10-CM | POA: Diagnosis not present

## 2017-10-24 DIAGNOSIS — E871 Hypo-osmolality and hyponatremia: Secondary | ICD-10-CM | POA: Diagnosis not present

## 2017-10-25 DIAGNOSIS — G629 Polyneuropathy, unspecified: Secondary | ICD-10-CM | POA: Diagnosis not present

## 2017-10-25 DIAGNOSIS — Z45010 Encounter for checking and testing of cardiac pacemaker pulse generator [battery]: Secondary | ICD-10-CM | POA: Diagnosis not present

## 2017-10-25 DIAGNOSIS — N183 Chronic kidney disease, stage 3 (moderate): Secondary | ICD-10-CM | POA: Diagnosis not present

## 2017-10-25 DIAGNOSIS — I5032 Chronic diastolic (congestive) heart failure: Secondary | ICD-10-CM | POA: Diagnosis not present

## 2017-10-25 DIAGNOSIS — R531 Weakness: Secondary | ICD-10-CM | POA: Diagnosis not present

## 2017-10-25 DIAGNOSIS — E871 Hypo-osmolality and hyponatremia: Secondary | ICD-10-CM | POA: Diagnosis not present

## 2017-10-25 DIAGNOSIS — I130 Hypertensive heart and chronic kidney disease with heart failure and stage 1 through stage 4 chronic kidney disease, or unspecified chronic kidney disease: Secondary | ICD-10-CM | POA: Diagnosis not present

## 2017-10-25 DIAGNOSIS — Z950 Presence of cardiac pacemaker: Secondary | ICD-10-CM | POA: Diagnosis not present

## 2017-10-29 DIAGNOSIS — E871 Hypo-osmolality and hyponatremia: Secondary | ICD-10-CM | POA: Diagnosis not present

## 2017-10-30 DIAGNOSIS — I10 Essential (primary) hypertension: Secondary | ICD-10-CM | POA: Diagnosis not present

## 2017-10-31 DIAGNOSIS — G629 Polyneuropathy, unspecified: Secondary | ICD-10-CM | POA: Diagnosis not present

## 2017-10-31 DIAGNOSIS — R531 Weakness: Secondary | ICD-10-CM | POA: Diagnosis not present

## 2017-10-31 DIAGNOSIS — E871 Hypo-osmolality and hyponatremia: Secondary | ICD-10-CM | POA: Diagnosis not present

## 2017-10-31 DIAGNOSIS — I5032 Chronic diastolic (congestive) heart failure: Secondary | ICD-10-CM | POA: Diagnosis not present

## 2017-10-31 DIAGNOSIS — N183 Chronic kidney disease, stage 3 (moderate): Secondary | ICD-10-CM | POA: Diagnosis not present

## 2017-10-31 DIAGNOSIS — I130 Hypertensive heart and chronic kidney disease with heart failure and stage 1 through stage 4 chronic kidney disease, or unspecified chronic kidney disease: Secondary | ICD-10-CM | POA: Diagnosis not present

## 2017-11-02 DIAGNOSIS — Z950 Presence of cardiac pacemaker: Secondary | ICD-10-CM | POA: Diagnosis not present

## 2017-11-02 DIAGNOSIS — R531 Weakness: Secondary | ICD-10-CM | POA: Diagnosis not present

## 2017-11-02 DIAGNOSIS — I071 Rheumatic tricuspid insufficiency: Secondary | ICD-10-CM | POA: Insufficient documentation

## 2017-11-02 DIAGNOSIS — E871 Hypo-osmolality and hyponatremia: Secondary | ICD-10-CM | POA: Diagnosis not present

## 2017-11-02 DIAGNOSIS — N183 Chronic kidney disease, stage 3 (moderate): Secondary | ICD-10-CM | POA: Diagnosis not present

## 2017-11-02 DIAGNOSIS — R5383 Other fatigue: Secondary | ICD-10-CM | POA: Diagnosis not present

## 2017-11-02 DIAGNOSIS — R609 Edema, unspecified: Secondary | ICD-10-CM | POA: Diagnosis not present

## 2017-11-02 DIAGNOSIS — G629 Polyneuropathy, unspecified: Secondary | ICD-10-CM | POA: Diagnosis not present

## 2017-11-02 DIAGNOSIS — I447 Left bundle-branch block, unspecified: Secondary | ICD-10-CM | POA: Diagnosis not present

## 2017-11-02 DIAGNOSIS — I5032 Chronic diastolic (congestive) heart failure: Secondary | ICD-10-CM | POA: Diagnosis not present

## 2017-11-02 DIAGNOSIS — I130 Hypertensive heart and chronic kidney disease with heart failure and stage 1 through stage 4 chronic kidney disease, or unspecified chronic kidney disease: Secondary | ICD-10-CM | POA: Diagnosis not present

## 2017-11-02 HISTORY — DX: Rheumatic tricuspid insufficiency: I07.1

## 2017-11-07 DIAGNOSIS — G629 Polyneuropathy, unspecified: Secondary | ICD-10-CM | POA: Diagnosis not present

## 2017-11-07 DIAGNOSIS — N183 Chronic kidney disease, stage 3 (moderate): Secondary | ICD-10-CM | POA: Diagnosis not present

## 2017-11-07 DIAGNOSIS — I130 Hypertensive heart and chronic kidney disease with heart failure and stage 1 through stage 4 chronic kidney disease, or unspecified chronic kidney disease: Secondary | ICD-10-CM | POA: Diagnosis not present

## 2017-11-07 DIAGNOSIS — I5032 Chronic diastolic (congestive) heart failure: Secondary | ICD-10-CM | POA: Diagnosis not present

## 2017-11-07 DIAGNOSIS — E871 Hypo-osmolality and hyponatremia: Secondary | ICD-10-CM | POA: Diagnosis not present

## 2017-11-07 DIAGNOSIS — R531 Weakness: Secondary | ICD-10-CM | POA: Diagnosis not present

## 2017-11-09 DIAGNOSIS — I071 Rheumatic tricuspid insufficiency: Secondary | ICD-10-CM | POA: Diagnosis not present

## 2017-11-15 DIAGNOSIS — I130 Hypertensive heart and chronic kidney disease with heart failure and stage 1 through stage 4 chronic kidney disease, or unspecified chronic kidney disease: Secondary | ICD-10-CM | POA: Diagnosis not present

## 2017-11-15 DIAGNOSIS — E871 Hypo-osmolality and hyponatremia: Secondary | ICD-10-CM | POA: Diagnosis not present

## 2017-11-15 DIAGNOSIS — R531 Weakness: Secondary | ICD-10-CM | POA: Diagnosis not present

## 2017-11-15 DIAGNOSIS — N183 Chronic kidney disease, stage 3 (moderate): Secondary | ICD-10-CM | POA: Diagnosis not present

## 2017-11-15 DIAGNOSIS — I5032 Chronic diastolic (congestive) heart failure: Secondary | ICD-10-CM | POA: Diagnosis not present

## 2017-11-15 DIAGNOSIS — G629 Polyneuropathy, unspecified: Secondary | ICD-10-CM | POA: Diagnosis not present

## 2017-11-21 DIAGNOSIS — I5032 Chronic diastolic (congestive) heart failure: Secondary | ICD-10-CM | POA: Diagnosis not present

## 2017-11-21 DIAGNOSIS — R531 Weakness: Secondary | ICD-10-CM | POA: Diagnosis not present

## 2017-11-21 DIAGNOSIS — I130 Hypertensive heart and chronic kidney disease with heart failure and stage 1 through stage 4 chronic kidney disease, or unspecified chronic kidney disease: Secondary | ICD-10-CM | POA: Diagnosis not present

## 2017-11-21 DIAGNOSIS — E871 Hypo-osmolality and hyponatremia: Secondary | ICD-10-CM | POA: Diagnosis not present

## 2017-11-21 DIAGNOSIS — G629 Polyneuropathy, unspecified: Secondary | ICD-10-CM | POA: Diagnosis not present

## 2017-11-21 DIAGNOSIS — N183 Chronic kidney disease, stage 3 (moderate): Secondary | ICD-10-CM | POA: Diagnosis not present

## 2017-11-22 DIAGNOSIS — E871 Hypo-osmolality and hyponatremia: Secondary | ICD-10-CM | POA: Diagnosis not present

## 2017-11-22 DIAGNOSIS — N183 Chronic kidney disease, stage 3 (moderate): Secondary | ICD-10-CM | POA: Diagnosis not present

## 2017-11-22 DIAGNOSIS — R531 Weakness: Secondary | ICD-10-CM | POA: Diagnosis not present

## 2017-11-22 DIAGNOSIS — G629 Polyneuropathy, unspecified: Secondary | ICD-10-CM | POA: Diagnosis not present

## 2017-11-22 DIAGNOSIS — I5032 Chronic diastolic (congestive) heart failure: Secondary | ICD-10-CM | POA: Diagnosis not present

## 2017-11-22 DIAGNOSIS — I130 Hypertensive heart and chronic kidney disease with heart failure and stage 1 through stage 4 chronic kidney disease, or unspecified chronic kidney disease: Secondary | ICD-10-CM | POA: Diagnosis not present

## 2017-11-26 DIAGNOSIS — G629 Polyneuropathy, unspecified: Secondary | ICD-10-CM | POA: Diagnosis not present

## 2017-11-26 DIAGNOSIS — I5032 Chronic diastolic (congestive) heart failure: Secondary | ICD-10-CM | POA: Diagnosis not present

## 2017-11-26 DIAGNOSIS — R531 Weakness: Secondary | ICD-10-CM | POA: Diagnosis not present

## 2017-11-26 DIAGNOSIS — I130 Hypertensive heart and chronic kidney disease with heart failure and stage 1 through stage 4 chronic kidney disease, or unspecified chronic kidney disease: Secondary | ICD-10-CM | POA: Diagnosis not present

## 2017-11-26 DIAGNOSIS — N183 Chronic kidney disease, stage 3 (moderate): Secondary | ICD-10-CM | POA: Diagnosis not present

## 2017-11-26 DIAGNOSIS — E871 Hypo-osmolality and hyponatremia: Secondary | ICD-10-CM | POA: Diagnosis not present

## 2017-11-27 DIAGNOSIS — G629 Polyneuropathy, unspecified: Secondary | ICD-10-CM | POA: Diagnosis not present

## 2017-11-27 DIAGNOSIS — E871 Hypo-osmolality and hyponatremia: Secondary | ICD-10-CM | POA: Diagnosis not present

## 2017-11-27 DIAGNOSIS — I5032 Chronic diastolic (congestive) heart failure: Secondary | ICD-10-CM | POA: Diagnosis not present

## 2017-11-27 DIAGNOSIS — R531 Weakness: Secondary | ICD-10-CM | POA: Diagnosis not present

## 2017-11-27 DIAGNOSIS — N183 Chronic kidney disease, stage 3 (moderate): Secondary | ICD-10-CM | POA: Diagnosis not present

## 2017-11-27 DIAGNOSIS — I130 Hypertensive heart and chronic kidney disease with heart failure and stage 1 through stage 4 chronic kidney disease, or unspecified chronic kidney disease: Secondary | ICD-10-CM | POA: Diagnosis not present

## 2017-12-05 DIAGNOSIS — E871 Hypo-osmolality and hyponatremia: Secondary | ICD-10-CM | POA: Diagnosis not present

## 2017-12-07 DIAGNOSIS — I5030 Unspecified diastolic (congestive) heart failure: Secondary | ICD-10-CM | POA: Diagnosis not present

## 2017-12-07 DIAGNOSIS — Z6841 Body Mass Index (BMI) 40.0 and over, adult: Secondary | ICD-10-CM | POA: Diagnosis not present

## 2017-12-07 DIAGNOSIS — E871 Hypo-osmolality and hyponatremia: Secondary | ICD-10-CM | POA: Diagnosis not present

## 2017-12-07 DIAGNOSIS — N183 Chronic kidney disease, stage 3 (moderate): Secondary | ICD-10-CM | POA: Diagnosis not present

## 2017-12-31 DIAGNOSIS — R069 Unspecified abnormalities of breathing: Secondary | ICD-10-CM | POA: Diagnosis not present

## 2017-12-31 DIAGNOSIS — E7800 Pure hypercholesterolemia, unspecified: Secondary | ICD-10-CM | POA: Diagnosis not present

## 2017-12-31 DIAGNOSIS — I509 Heart failure, unspecified: Secondary | ICD-10-CM | POA: Diagnosis not present

## 2017-12-31 DIAGNOSIS — R0602 Shortness of breath: Secondary | ICD-10-CM | POA: Diagnosis not present

## 2017-12-31 DIAGNOSIS — I110 Hypertensive heart disease with heart failure: Secondary | ICD-10-CM | POA: Diagnosis not present

## 2017-12-31 DIAGNOSIS — R609 Edema, unspecified: Secondary | ICD-10-CM | POA: Diagnosis not present

## 2018-01-09 DIAGNOSIS — N183 Chronic kidney disease, stage 3 (moderate): Secondary | ICD-10-CM | POA: Diagnosis not present

## 2018-01-09 DIAGNOSIS — I5030 Unspecified diastolic (congestive) heart failure: Secondary | ICD-10-CM | POA: Diagnosis not present

## 2018-01-09 DIAGNOSIS — I10 Essential (primary) hypertension: Secondary | ICD-10-CM | POA: Diagnosis not present

## 2018-01-24 DIAGNOSIS — Z950 Presence of cardiac pacemaker: Secondary | ICD-10-CM | POA: Diagnosis not present

## 2018-02-06 DIAGNOSIS — Z23 Encounter for immunization: Secondary | ICD-10-CM | POA: Diagnosis not present

## 2018-02-11 DIAGNOSIS — I5030 Unspecified diastolic (congestive) heart failure: Secondary | ICD-10-CM | POA: Diagnosis not present

## 2018-02-11 DIAGNOSIS — E785 Hyperlipidemia, unspecified: Secondary | ICD-10-CM | POA: Diagnosis not present

## 2018-02-11 DIAGNOSIS — I10 Essential (primary) hypertension: Secondary | ICD-10-CM | POA: Diagnosis not present

## 2018-02-11 DIAGNOSIS — Z79899 Other long term (current) drug therapy: Secondary | ICD-10-CM | POA: Diagnosis not present

## 2018-02-11 DIAGNOSIS — N183 Chronic kidney disease, stage 3 (moderate): Secondary | ICD-10-CM | POA: Diagnosis not present

## 2018-02-11 DIAGNOSIS — K219 Gastro-esophageal reflux disease without esophagitis: Secondary | ICD-10-CM | POA: Diagnosis not present

## 2018-02-11 DIAGNOSIS — E039 Hypothyroidism, unspecified: Secondary | ICD-10-CM | POA: Diagnosis not present

## 2018-02-19 DIAGNOSIS — F419 Anxiety disorder, unspecified: Secondary | ICD-10-CM | POA: Diagnosis not present

## 2018-02-19 DIAGNOSIS — G8929 Other chronic pain: Secondary | ICD-10-CM | POA: Diagnosis not present

## 2018-02-19 DIAGNOSIS — Z9049 Acquired absence of other specified parts of digestive tract: Secondary | ICD-10-CM | POA: Diagnosis not present

## 2018-02-19 DIAGNOSIS — M549 Dorsalgia, unspecified: Secondary | ICD-10-CM | POA: Diagnosis not present

## 2018-02-19 DIAGNOSIS — M159 Polyosteoarthritis, unspecified: Secondary | ICD-10-CM | POA: Diagnosis not present

## 2018-02-19 DIAGNOSIS — Z7982 Long term (current) use of aspirin: Secondary | ICD-10-CM | POA: Diagnosis not present

## 2018-02-19 DIAGNOSIS — I5032 Chronic diastolic (congestive) heart failure: Secondary | ICD-10-CM | POA: Diagnosis not present

## 2018-02-19 DIAGNOSIS — Z6841 Body Mass Index (BMI) 40.0 and over, adult: Secondary | ICD-10-CM | POA: Diagnosis not present

## 2018-02-19 DIAGNOSIS — Z96653 Presence of artificial knee joint, bilateral: Secondary | ICD-10-CM | POA: Diagnosis not present

## 2018-02-19 DIAGNOSIS — Z87891 Personal history of nicotine dependence: Secondary | ICD-10-CM | POA: Diagnosis not present

## 2018-02-19 DIAGNOSIS — Z9181 History of falling: Secondary | ICD-10-CM | POA: Diagnosis not present

## 2018-02-19 DIAGNOSIS — F329 Major depressive disorder, single episode, unspecified: Secondary | ICD-10-CM | POA: Diagnosis not present

## 2018-02-19 DIAGNOSIS — Z950 Presence of cardiac pacemaker: Secondary | ICD-10-CM | POA: Diagnosis not present

## 2018-02-19 DIAGNOSIS — Z7951 Long term (current) use of inhaled steroids: Secondary | ICD-10-CM | POA: Diagnosis not present

## 2018-02-19 DIAGNOSIS — G629 Polyneuropathy, unspecified: Secondary | ICD-10-CM | POA: Diagnosis not present

## 2018-02-19 DIAGNOSIS — N183 Chronic kidney disease, stage 3 (moderate): Secondary | ICD-10-CM | POA: Diagnosis not present

## 2018-02-19 DIAGNOSIS — I130 Hypertensive heart and chronic kidney disease with heart failure and stage 1 through stage 4 chronic kidney disease, or unspecified chronic kidney disease: Secondary | ICD-10-CM | POA: Diagnosis not present

## 2018-02-19 DIAGNOSIS — E039 Hypothyroidism, unspecified: Secondary | ICD-10-CM | POA: Diagnosis not present

## 2018-02-19 DIAGNOSIS — Z90710 Acquired absence of both cervix and uterus: Secondary | ICD-10-CM | POA: Diagnosis not present

## 2018-02-21 DIAGNOSIS — N183 Chronic kidney disease, stage 3 (moderate): Secondary | ICD-10-CM | POA: Diagnosis not present

## 2018-02-21 DIAGNOSIS — Z6841 Body Mass Index (BMI) 40.0 and over, adult: Secondary | ICD-10-CM | POA: Diagnosis not present

## 2018-02-21 DIAGNOSIS — E039 Hypothyroidism, unspecified: Secondary | ICD-10-CM | POA: Diagnosis not present

## 2018-02-21 DIAGNOSIS — K1120 Sialoadenitis, unspecified: Secondary | ICD-10-CM | POA: Diagnosis not present

## 2018-02-22 DIAGNOSIS — I130 Hypertensive heart and chronic kidney disease with heart failure and stage 1 through stage 4 chronic kidney disease, or unspecified chronic kidney disease: Secondary | ICD-10-CM | POA: Diagnosis not present

## 2018-02-25 DIAGNOSIS — G629 Polyneuropathy, unspecified: Secondary | ICD-10-CM | POA: Diagnosis not present

## 2018-02-25 DIAGNOSIS — G8929 Other chronic pain: Secondary | ICD-10-CM | POA: Diagnosis not present

## 2018-02-25 DIAGNOSIS — I130 Hypertensive heart and chronic kidney disease with heart failure and stage 1 through stage 4 chronic kidney disease, or unspecified chronic kidney disease: Secondary | ICD-10-CM | POA: Diagnosis not present

## 2018-02-25 DIAGNOSIS — N183 Chronic kidney disease, stage 3 (moderate): Secondary | ICD-10-CM | POA: Diagnosis not present

## 2018-02-25 DIAGNOSIS — I5032 Chronic diastolic (congestive) heart failure: Secondary | ICD-10-CM | POA: Diagnosis not present

## 2018-02-25 DIAGNOSIS — M549 Dorsalgia, unspecified: Secondary | ICD-10-CM | POA: Diagnosis not present

## 2018-02-28 DIAGNOSIS — N183 Chronic kidney disease, stage 3 (moderate): Secondary | ICD-10-CM | POA: Diagnosis not present

## 2018-02-28 DIAGNOSIS — M549 Dorsalgia, unspecified: Secondary | ICD-10-CM | POA: Diagnosis not present

## 2018-02-28 DIAGNOSIS — I130 Hypertensive heart and chronic kidney disease with heart failure and stage 1 through stage 4 chronic kidney disease, or unspecified chronic kidney disease: Secondary | ICD-10-CM | POA: Diagnosis not present

## 2018-02-28 DIAGNOSIS — I5032 Chronic diastolic (congestive) heart failure: Secondary | ICD-10-CM | POA: Diagnosis not present

## 2018-02-28 DIAGNOSIS — G8929 Other chronic pain: Secondary | ICD-10-CM | POA: Diagnosis not present

## 2018-02-28 DIAGNOSIS — G629 Polyneuropathy, unspecified: Secondary | ICD-10-CM | POA: Diagnosis not present

## 2018-03-05 DIAGNOSIS — G8929 Other chronic pain: Secondary | ICD-10-CM | POA: Diagnosis not present

## 2018-03-05 DIAGNOSIS — N183 Chronic kidney disease, stage 3 (moderate): Secondary | ICD-10-CM | POA: Diagnosis not present

## 2018-03-05 DIAGNOSIS — M549 Dorsalgia, unspecified: Secondary | ICD-10-CM | POA: Diagnosis not present

## 2018-03-05 DIAGNOSIS — I130 Hypertensive heart and chronic kidney disease with heart failure and stage 1 through stage 4 chronic kidney disease, or unspecified chronic kidney disease: Secondary | ICD-10-CM | POA: Diagnosis not present

## 2018-03-05 DIAGNOSIS — I5032 Chronic diastolic (congestive) heart failure: Secondary | ICD-10-CM | POA: Diagnosis not present

## 2018-03-05 DIAGNOSIS — G629 Polyneuropathy, unspecified: Secondary | ICD-10-CM | POA: Diagnosis not present

## 2018-03-07 DIAGNOSIS — G8929 Other chronic pain: Secondary | ICD-10-CM | POA: Diagnosis not present

## 2018-03-07 DIAGNOSIS — I130 Hypertensive heart and chronic kidney disease with heart failure and stage 1 through stage 4 chronic kidney disease, or unspecified chronic kidney disease: Secondary | ICD-10-CM | POA: Diagnosis not present

## 2018-03-07 DIAGNOSIS — N183 Chronic kidney disease, stage 3 (moderate): Secondary | ICD-10-CM | POA: Diagnosis not present

## 2018-03-07 DIAGNOSIS — M549 Dorsalgia, unspecified: Secondary | ICD-10-CM | POA: Diagnosis not present

## 2018-03-07 DIAGNOSIS — G629 Polyneuropathy, unspecified: Secondary | ICD-10-CM | POA: Diagnosis not present

## 2018-03-07 DIAGNOSIS — I5032 Chronic diastolic (congestive) heart failure: Secondary | ICD-10-CM | POA: Diagnosis not present

## 2018-03-11 DIAGNOSIS — N183 Chronic kidney disease, stage 3 (moderate): Secondary | ICD-10-CM | POA: Diagnosis not present

## 2018-03-11 DIAGNOSIS — G629 Polyneuropathy, unspecified: Secondary | ICD-10-CM | POA: Diagnosis not present

## 2018-03-11 DIAGNOSIS — G8929 Other chronic pain: Secondary | ICD-10-CM | POA: Diagnosis not present

## 2018-03-11 DIAGNOSIS — I5032 Chronic diastolic (congestive) heart failure: Secondary | ICD-10-CM | POA: Diagnosis not present

## 2018-03-11 DIAGNOSIS — M549 Dorsalgia, unspecified: Secondary | ICD-10-CM | POA: Diagnosis not present

## 2018-03-11 DIAGNOSIS — I130 Hypertensive heart and chronic kidney disease with heart failure and stage 1 through stage 4 chronic kidney disease, or unspecified chronic kidney disease: Secondary | ICD-10-CM | POA: Diagnosis not present

## 2018-03-14 DIAGNOSIS — I130 Hypertensive heart and chronic kidney disease with heart failure and stage 1 through stage 4 chronic kidney disease, or unspecified chronic kidney disease: Secondary | ICD-10-CM | POA: Diagnosis not present

## 2018-03-14 DIAGNOSIS — M549 Dorsalgia, unspecified: Secondary | ICD-10-CM | POA: Diagnosis not present

## 2018-03-14 DIAGNOSIS — N183 Chronic kidney disease, stage 3 (moderate): Secondary | ICD-10-CM | POA: Diagnosis not present

## 2018-03-14 DIAGNOSIS — G629 Polyneuropathy, unspecified: Secondary | ICD-10-CM | POA: Diagnosis not present

## 2018-03-14 DIAGNOSIS — G8929 Other chronic pain: Secondary | ICD-10-CM | POA: Diagnosis not present

## 2018-03-14 DIAGNOSIS — I5032 Chronic diastolic (congestive) heart failure: Secondary | ICD-10-CM | POA: Diagnosis not present

## 2018-03-18 DIAGNOSIS — G629 Polyneuropathy, unspecified: Secondary | ICD-10-CM | POA: Diagnosis not present

## 2018-03-18 DIAGNOSIS — M549 Dorsalgia, unspecified: Secondary | ICD-10-CM | POA: Diagnosis not present

## 2018-03-18 DIAGNOSIS — G8929 Other chronic pain: Secondary | ICD-10-CM | POA: Diagnosis not present

## 2018-03-18 DIAGNOSIS — I5032 Chronic diastolic (congestive) heart failure: Secondary | ICD-10-CM | POA: Diagnosis not present

## 2018-03-18 DIAGNOSIS — I130 Hypertensive heart and chronic kidney disease with heart failure and stage 1 through stage 4 chronic kidney disease, or unspecified chronic kidney disease: Secondary | ICD-10-CM | POA: Diagnosis not present

## 2018-03-18 DIAGNOSIS — N183 Chronic kidney disease, stage 3 (moderate): Secondary | ICD-10-CM | POA: Diagnosis not present

## 2018-03-21 DIAGNOSIS — I5032 Chronic diastolic (congestive) heart failure: Secondary | ICD-10-CM | POA: Diagnosis not present

## 2018-03-21 DIAGNOSIS — G8929 Other chronic pain: Secondary | ICD-10-CM | POA: Diagnosis not present

## 2018-03-21 DIAGNOSIS — N183 Chronic kidney disease, stage 3 (moderate): Secondary | ICD-10-CM | POA: Diagnosis not present

## 2018-03-21 DIAGNOSIS — G629 Polyneuropathy, unspecified: Secondary | ICD-10-CM | POA: Diagnosis not present

## 2018-03-21 DIAGNOSIS — I130 Hypertensive heart and chronic kidney disease with heart failure and stage 1 through stage 4 chronic kidney disease, or unspecified chronic kidney disease: Secondary | ICD-10-CM | POA: Diagnosis not present

## 2018-03-21 DIAGNOSIS — M549 Dorsalgia, unspecified: Secondary | ICD-10-CM | POA: Diagnosis not present

## 2018-03-25 DIAGNOSIS — M549 Dorsalgia, unspecified: Secondary | ICD-10-CM | POA: Diagnosis not present

## 2018-03-25 DIAGNOSIS — I5032 Chronic diastolic (congestive) heart failure: Secondary | ICD-10-CM | POA: Diagnosis not present

## 2018-03-25 DIAGNOSIS — G8929 Other chronic pain: Secondary | ICD-10-CM | POA: Diagnosis not present

## 2018-03-25 DIAGNOSIS — N183 Chronic kidney disease, stage 3 (moderate): Secondary | ICD-10-CM | POA: Diagnosis not present

## 2018-03-25 DIAGNOSIS — G629 Polyneuropathy, unspecified: Secondary | ICD-10-CM | POA: Diagnosis not present

## 2018-03-25 DIAGNOSIS — I130 Hypertensive heart and chronic kidney disease with heart failure and stage 1 through stage 4 chronic kidney disease, or unspecified chronic kidney disease: Secondary | ICD-10-CM | POA: Diagnosis not present

## 2018-03-27 DIAGNOSIS — H04123 Dry eye syndrome of bilateral lacrimal glands: Secondary | ICD-10-CM | POA: Diagnosis not present

## 2018-03-27 DIAGNOSIS — H353111 Nonexudative age-related macular degeneration, right eye, early dry stage: Secondary | ICD-10-CM | POA: Diagnosis not present

## 2018-03-28 DIAGNOSIS — I130 Hypertensive heart and chronic kidney disease with heart failure and stage 1 through stage 4 chronic kidney disease, or unspecified chronic kidney disease: Secondary | ICD-10-CM | POA: Diagnosis not present

## 2018-03-28 DIAGNOSIS — G629 Polyneuropathy, unspecified: Secondary | ICD-10-CM | POA: Diagnosis not present

## 2018-03-28 DIAGNOSIS — G8929 Other chronic pain: Secondary | ICD-10-CM | POA: Diagnosis not present

## 2018-03-28 DIAGNOSIS — M549 Dorsalgia, unspecified: Secondary | ICD-10-CM | POA: Diagnosis not present

## 2018-03-28 DIAGNOSIS — N183 Chronic kidney disease, stage 3 (moderate): Secondary | ICD-10-CM | POA: Diagnosis not present

## 2018-03-28 DIAGNOSIS — I5032 Chronic diastolic (congestive) heart failure: Secondary | ICD-10-CM | POA: Diagnosis not present

## 2018-04-02 DIAGNOSIS — G8929 Other chronic pain: Secondary | ICD-10-CM | POA: Diagnosis not present

## 2018-04-02 DIAGNOSIS — N183 Chronic kidney disease, stage 3 (moderate): Secondary | ICD-10-CM | POA: Diagnosis not present

## 2018-04-02 DIAGNOSIS — I5032 Chronic diastolic (congestive) heart failure: Secondary | ICD-10-CM | POA: Diagnosis not present

## 2018-04-02 DIAGNOSIS — G629 Polyneuropathy, unspecified: Secondary | ICD-10-CM | POA: Diagnosis not present

## 2018-04-02 DIAGNOSIS — M549 Dorsalgia, unspecified: Secondary | ICD-10-CM | POA: Diagnosis not present

## 2018-04-02 DIAGNOSIS — I130 Hypertensive heart and chronic kidney disease with heart failure and stage 1 through stage 4 chronic kidney disease, or unspecified chronic kidney disease: Secondary | ICD-10-CM | POA: Diagnosis not present

## 2018-04-04 DIAGNOSIS — G629 Polyneuropathy, unspecified: Secondary | ICD-10-CM | POA: Diagnosis not present

## 2018-04-04 DIAGNOSIS — G8929 Other chronic pain: Secondary | ICD-10-CM | POA: Diagnosis not present

## 2018-04-04 DIAGNOSIS — M549 Dorsalgia, unspecified: Secondary | ICD-10-CM | POA: Diagnosis not present

## 2018-04-04 DIAGNOSIS — N183 Chronic kidney disease, stage 3 (moderate): Secondary | ICD-10-CM | POA: Diagnosis not present

## 2018-04-04 DIAGNOSIS — I5032 Chronic diastolic (congestive) heart failure: Secondary | ICD-10-CM | POA: Diagnosis not present

## 2018-04-04 DIAGNOSIS — I130 Hypertensive heart and chronic kidney disease with heart failure and stage 1 through stage 4 chronic kidney disease, or unspecified chronic kidney disease: Secondary | ICD-10-CM | POA: Diagnosis not present

## 2018-04-09 DIAGNOSIS — E039 Hypothyroidism, unspecified: Secondary | ICD-10-CM | POA: Diagnosis not present

## 2018-04-09 DIAGNOSIS — E785 Hyperlipidemia, unspecified: Secondary | ICD-10-CM | POA: Diagnosis not present

## 2018-04-09 DIAGNOSIS — Z79899 Other long term (current) drug therapy: Secondary | ICD-10-CM | POA: Diagnosis not present

## 2018-04-09 DIAGNOSIS — E871 Hypo-osmolality and hyponatremia: Secondary | ICD-10-CM | POA: Diagnosis not present

## 2018-04-09 DIAGNOSIS — R7309 Other abnormal glucose: Secondary | ICD-10-CM | POA: Diagnosis not present

## 2018-04-09 DIAGNOSIS — I5030 Unspecified diastolic (congestive) heart failure: Secondary | ICD-10-CM | POA: Diagnosis not present

## 2018-04-10 DIAGNOSIS — I130 Hypertensive heart and chronic kidney disease with heart failure and stage 1 through stage 4 chronic kidney disease, or unspecified chronic kidney disease: Secondary | ICD-10-CM | POA: Diagnosis not present

## 2018-04-10 DIAGNOSIS — G629 Polyneuropathy, unspecified: Secondary | ICD-10-CM | POA: Diagnosis not present

## 2018-04-10 DIAGNOSIS — I5032 Chronic diastolic (congestive) heart failure: Secondary | ICD-10-CM | POA: Diagnosis not present

## 2018-04-10 DIAGNOSIS — N183 Chronic kidney disease, stage 3 (moderate): Secondary | ICD-10-CM | POA: Diagnosis not present

## 2018-04-10 DIAGNOSIS — M549 Dorsalgia, unspecified: Secondary | ICD-10-CM | POA: Diagnosis not present

## 2018-04-10 DIAGNOSIS — G8929 Other chronic pain: Secondary | ICD-10-CM | POA: Diagnosis not present

## 2018-04-12 DIAGNOSIS — G8929 Other chronic pain: Secondary | ICD-10-CM | POA: Diagnosis not present

## 2018-04-12 DIAGNOSIS — G629 Polyneuropathy, unspecified: Secondary | ICD-10-CM | POA: Diagnosis not present

## 2018-04-12 DIAGNOSIS — I130 Hypertensive heart and chronic kidney disease with heart failure and stage 1 through stage 4 chronic kidney disease, or unspecified chronic kidney disease: Secondary | ICD-10-CM | POA: Diagnosis not present

## 2018-04-12 DIAGNOSIS — N183 Chronic kidney disease, stage 3 (moderate): Secondary | ICD-10-CM | POA: Diagnosis not present

## 2018-04-12 DIAGNOSIS — I5032 Chronic diastolic (congestive) heart failure: Secondary | ICD-10-CM | POA: Diagnosis not present

## 2018-04-12 DIAGNOSIS — M549 Dorsalgia, unspecified: Secondary | ICD-10-CM | POA: Diagnosis not present

## 2018-04-16 DIAGNOSIS — I5032 Chronic diastolic (congestive) heart failure: Secondary | ICD-10-CM | POA: Diagnosis not present

## 2018-04-16 DIAGNOSIS — N183 Chronic kidney disease, stage 3 (moderate): Secondary | ICD-10-CM | POA: Diagnosis not present

## 2018-04-16 DIAGNOSIS — G629 Polyneuropathy, unspecified: Secondary | ICD-10-CM | POA: Diagnosis not present

## 2018-04-16 DIAGNOSIS — M549 Dorsalgia, unspecified: Secondary | ICD-10-CM | POA: Diagnosis not present

## 2018-04-16 DIAGNOSIS — I130 Hypertensive heart and chronic kidney disease with heart failure and stage 1 through stage 4 chronic kidney disease, or unspecified chronic kidney disease: Secondary | ICD-10-CM | POA: Diagnosis not present

## 2018-04-16 DIAGNOSIS — G8929 Other chronic pain: Secondary | ICD-10-CM | POA: Diagnosis not present

## 2018-04-17 DIAGNOSIS — M549 Dorsalgia, unspecified: Secondary | ICD-10-CM | POA: Diagnosis not present

## 2018-04-17 DIAGNOSIS — G629 Polyneuropathy, unspecified: Secondary | ICD-10-CM | POA: Diagnosis not present

## 2018-04-17 DIAGNOSIS — N183 Chronic kidney disease, stage 3 (moderate): Secondary | ICD-10-CM | POA: Diagnosis not present

## 2018-04-17 DIAGNOSIS — I5032 Chronic diastolic (congestive) heart failure: Secondary | ICD-10-CM | POA: Diagnosis not present

## 2018-04-17 DIAGNOSIS — G8929 Other chronic pain: Secondary | ICD-10-CM | POA: Diagnosis not present

## 2018-04-17 DIAGNOSIS — I130 Hypertensive heart and chronic kidney disease with heart failure and stage 1 through stage 4 chronic kidney disease, or unspecified chronic kidney disease: Secondary | ICD-10-CM | POA: Diagnosis not present

## 2018-04-20 DIAGNOSIS — M159 Polyosteoarthritis, unspecified: Secondary | ICD-10-CM | POA: Diagnosis not present

## 2018-04-20 DIAGNOSIS — Z6841 Body Mass Index (BMI) 40.0 and over, adult: Secondary | ICD-10-CM | POA: Diagnosis not present

## 2018-04-20 DIAGNOSIS — Z7982 Long term (current) use of aspirin: Secondary | ICD-10-CM | POA: Diagnosis not present

## 2018-04-20 DIAGNOSIS — E039 Hypothyroidism, unspecified: Secondary | ICD-10-CM | POA: Diagnosis not present

## 2018-04-20 DIAGNOSIS — Z7951 Long term (current) use of inhaled steroids: Secondary | ICD-10-CM | POA: Diagnosis not present

## 2018-04-20 DIAGNOSIS — Z950 Presence of cardiac pacemaker: Secondary | ICD-10-CM | POA: Diagnosis not present

## 2018-04-20 DIAGNOSIS — Z9181 History of falling: Secondary | ICD-10-CM | POA: Diagnosis not present

## 2018-04-20 DIAGNOSIS — F419 Anxiety disorder, unspecified: Secondary | ICD-10-CM | POA: Diagnosis not present

## 2018-04-20 DIAGNOSIS — G629 Polyneuropathy, unspecified: Secondary | ICD-10-CM | POA: Diagnosis not present

## 2018-04-20 DIAGNOSIS — I130 Hypertensive heart and chronic kidney disease with heart failure and stage 1 through stage 4 chronic kidney disease, or unspecified chronic kidney disease: Secondary | ICD-10-CM | POA: Diagnosis not present

## 2018-04-20 DIAGNOSIS — Z90710 Acquired absence of both cervix and uterus: Secondary | ICD-10-CM | POA: Diagnosis not present

## 2018-04-20 DIAGNOSIS — Z87891 Personal history of nicotine dependence: Secondary | ICD-10-CM | POA: Diagnosis not present

## 2018-04-20 DIAGNOSIS — N183 Chronic kidney disease, stage 3 (moderate): Secondary | ICD-10-CM | POA: Diagnosis not present

## 2018-04-20 DIAGNOSIS — M549 Dorsalgia, unspecified: Secondary | ICD-10-CM | POA: Diagnosis not present

## 2018-04-20 DIAGNOSIS — I5032 Chronic diastolic (congestive) heart failure: Secondary | ICD-10-CM | POA: Diagnosis not present

## 2018-04-20 DIAGNOSIS — G8929 Other chronic pain: Secondary | ICD-10-CM | POA: Diagnosis not present

## 2018-04-20 DIAGNOSIS — F329 Major depressive disorder, single episode, unspecified: Secondary | ICD-10-CM | POA: Diagnosis not present

## 2018-04-25 DIAGNOSIS — B9689 Other specified bacterial agents as the cause of diseases classified elsewhere: Secondary | ICD-10-CM | POA: Diagnosis not present

## 2018-04-25 DIAGNOSIS — N183 Chronic kidney disease, stage 3 (moderate): Secondary | ICD-10-CM | POA: Diagnosis not present

## 2018-04-25 DIAGNOSIS — Z6841 Body Mass Index (BMI) 40.0 and over, adult: Secondary | ICD-10-CM | POA: Diagnosis not present

## 2018-04-25 DIAGNOSIS — I5032 Chronic diastolic (congestive) heart failure: Secondary | ICD-10-CM | POA: Diagnosis not present

## 2018-04-25 DIAGNOSIS — I130 Hypertensive heart and chronic kidney disease with heart failure and stage 1 through stage 4 chronic kidney disease, or unspecified chronic kidney disease: Secondary | ICD-10-CM | POA: Diagnosis not present

## 2018-04-25 DIAGNOSIS — G8929 Other chronic pain: Secondary | ICD-10-CM | POA: Diagnosis not present

## 2018-04-25 DIAGNOSIS — R0600 Dyspnea, unspecified: Secondary | ICD-10-CM | POA: Diagnosis not present

## 2018-04-25 DIAGNOSIS — J069 Acute upper respiratory infection, unspecified: Secondary | ICD-10-CM | POA: Diagnosis not present

## 2018-04-25 DIAGNOSIS — M549 Dorsalgia, unspecified: Secondary | ICD-10-CM | POA: Diagnosis not present

## 2018-04-26 DIAGNOSIS — I130 Hypertensive heart and chronic kidney disease with heart failure and stage 1 through stage 4 chronic kidney disease, or unspecified chronic kidney disease: Secondary | ICD-10-CM | POA: Diagnosis not present

## 2018-05-02 DIAGNOSIS — R5383 Other fatigue: Secondary | ICD-10-CM | POA: Diagnosis not present

## 2018-05-02 DIAGNOSIS — N183 Chronic kidney disease, stage 3 (moderate): Secondary | ICD-10-CM | POA: Diagnosis not present

## 2018-05-02 DIAGNOSIS — Z6841 Body Mass Index (BMI) 40.0 and over, adult: Secondary | ICD-10-CM | POA: Diagnosis not present

## 2018-05-08 DIAGNOSIS — N2589 Other disorders resulting from impaired renal tubular function: Secondary | ICD-10-CM | POA: Diagnosis not present

## 2018-05-17 DIAGNOSIS — R7309 Other abnormal glucose: Secondary | ICD-10-CM | POA: Diagnosis not present

## 2018-05-17 DIAGNOSIS — J989 Respiratory disorder, unspecified: Secondary | ICD-10-CM | POA: Diagnosis not present

## 2018-05-17 DIAGNOSIS — I5030 Unspecified diastolic (congestive) heart failure: Secondary | ICD-10-CM | POA: Diagnosis not present

## 2018-05-17 DIAGNOSIS — I10 Essential (primary) hypertension: Secondary | ICD-10-CM | POA: Diagnosis not present

## 2018-05-30 DIAGNOSIS — I5032 Chronic diastolic (congestive) heart failure: Secondary | ICD-10-CM | POA: Diagnosis not present

## 2018-05-30 DIAGNOSIS — I130 Hypertensive heart and chronic kidney disease with heart failure and stage 1 through stage 4 chronic kidney disease, or unspecified chronic kidney disease: Secondary | ICD-10-CM | POA: Diagnosis not present

## 2018-05-30 DIAGNOSIS — Z45018 Encounter for adjustment and management of other part of cardiac pacemaker: Secondary | ICD-10-CM | POA: Diagnosis not present

## 2018-05-30 DIAGNOSIS — I442 Atrioventricular block, complete: Secondary | ICD-10-CM | POA: Diagnosis not present

## 2018-05-30 DIAGNOSIS — N183 Chronic kidney disease, stage 3 (moderate): Secondary | ICD-10-CM | POA: Diagnosis not present

## 2018-05-30 DIAGNOSIS — M549 Dorsalgia, unspecified: Secondary | ICD-10-CM | POA: Diagnosis not present

## 2018-05-30 DIAGNOSIS — G8929 Other chronic pain: Secondary | ICD-10-CM | POA: Diagnosis not present

## 2018-06-03 DIAGNOSIS — N183 Chronic kidney disease, stage 3 (moderate): Secondary | ICD-10-CM | POA: Diagnosis not present

## 2018-06-03 DIAGNOSIS — M549 Dorsalgia, unspecified: Secondary | ICD-10-CM | POA: Diagnosis not present

## 2018-06-03 DIAGNOSIS — I5032 Chronic diastolic (congestive) heart failure: Secondary | ICD-10-CM | POA: Diagnosis not present

## 2018-06-03 DIAGNOSIS — I130 Hypertensive heart and chronic kidney disease with heart failure and stage 1 through stage 4 chronic kidney disease, or unspecified chronic kidney disease: Secondary | ICD-10-CM | POA: Diagnosis not present

## 2018-06-03 DIAGNOSIS — G8929 Other chronic pain: Secondary | ICD-10-CM | POA: Diagnosis not present

## 2018-06-05 DIAGNOSIS — N183 Chronic kidney disease, stage 3 (moderate): Secondary | ICD-10-CM | POA: Diagnosis not present

## 2018-06-05 DIAGNOSIS — M549 Dorsalgia, unspecified: Secondary | ICD-10-CM | POA: Diagnosis not present

## 2018-06-05 DIAGNOSIS — I130 Hypertensive heart and chronic kidney disease with heart failure and stage 1 through stage 4 chronic kidney disease, or unspecified chronic kidney disease: Secondary | ICD-10-CM | POA: Diagnosis not present

## 2018-06-05 DIAGNOSIS — G8929 Other chronic pain: Secondary | ICD-10-CM | POA: Diagnosis not present

## 2018-06-05 DIAGNOSIS — I5032 Chronic diastolic (congestive) heart failure: Secondary | ICD-10-CM | POA: Diagnosis not present

## 2018-06-07 DIAGNOSIS — A4150 Gram-negative sepsis, unspecified: Secondary | ICD-10-CM | POA: Diagnosis not present

## 2018-06-07 DIAGNOSIS — Z452 Encounter for adjustment and management of vascular access device: Secondary | ICD-10-CM | POA: Diagnosis not present

## 2018-06-07 DIAGNOSIS — I10 Essential (primary) hypertension: Secondary | ICD-10-CM | POA: Diagnosis not present

## 2018-06-07 DIAGNOSIS — Z1612 Extended spectrum beta lactamase (ESBL) resistance: Secondary | ICD-10-CM | POA: Diagnosis not present

## 2018-06-07 DIAGNOSIS — Z7401 Bed confinement status: Secondary | ICD-10-CM | POA: Diagnosis not present

## 2018-06-07 DIAGNOSIS — A419 Sepsis, unspecified organism: Secondary | ICD-10-CM | POA: Diagnosis not present

## 2018-06-07 DIAGNOSIS — N179 Acute kidney failure, unspecified: Secondary | ICD-10-CM | POA: Diagnosis not present

## 2018-06-07 DIAGNOSIS — E1165 Type 2 diabetes mellitus with hyperglycemia: Secondary | ICD-10-CM | POA: Diagnosis not present

## 2018-06-07 DIAGNOSIS — E46 Unspecified protein-calorie malnutrition: Secondary | ICD-10-CM | POA: Diagnosis not present

## 2018-06-07 DIAGNOSIS — B9620 Unspecified Escherichia coli [E. coli] as the cause of diseases classified elsewhere: Secondary | ICD-10-CM | POA: Diagnosis present

## 2018-06-07 DIAGNOSIS — E876 Hypokalemia: Secondary | ICD-10-CM | POA: Diagnosis not present

## 2018-06-07 DIAGNOSIS — B9562 Methicillin resistant Staphylococcus aureus infection as the cause of diseases classified elsewhere: Secondary | ICD-10-CM | POA: Diagnosis not present

## 2018-06-07 DIAGNOSIS — N3000 Acute cystitis without hematuria: Secondary | ICD-10-CM | POA: Diagnosis not present

## 2018-06-07 DIAGNOSIS — Z79899 Other long term (current) drug therapy: Secondary | ICD-10-CM | POA: Diagnosis not present

## 2018-06-07 DIAGNOSIS — E1122 Type 2 diabetes mellitus with diabetic chronic kidney disease: Secondary | ICD-10-CM | POA: Diagnosis present

## 2018-06-07 DIAGNOSIS — K219 Gastro-esophageal reflux disease without esophagitis: Secondary | ICD-10-CM | POA: Diagnosis present

## 2018-06-07 DIAGNOSIS — Z9981 Dependence on supplemental oxygen: Secondary | ICD-10-CM | POA: Diagnosis not present

## 2018-06-07 DIAGNOSIS — Z22322 Carrier or suspected carrier of Methicillin resistant Staphylococcus aureus: Secondary | ICD-10-CM | POA: Diagnosis not present

## 2018-06-07 DIAGNOSIS — A4151 Sepsis due to Escherichia coli [E. coli]: Secondary | ICD-10-CM | POA: Diagnosis not present

## 2018-06-07 DIAGNOSIS — N183 Chronic kidney disease, stage 3 (moderate): Secondary | ICD-10-CM | POA: Diagnosis present

## 2018-06-07 DIAGNOSIS — R7881 Bacteremia: Secondary | ICD-10-CM | POA: Diagnosis not present

## 2018-06-07 DIAGNOSIS — Z7982 Long term (current) use of aspirin: Secondary | ICD-10-CM | POA: Diagnosis not present

## 2018-06-07 DIAGNOSIS — E871 Hypo-osmolality and hyponatremia: Secondary | ICD-10-CM | POA: Diagnosis present

## 2018-06-07 DIAGNOSIS — Z66 Do not resuscitate: Secondary | ICD-10-CM | POA: Diagnosis present

## 2018-06-07 DIAGNOSIS — G8929 Other chronic pain: Secondary | ICD-10-CM | POA: Diagnosis present

## 2018-06-07 DIAGNOSIS — I130 Hypertensive heart and chronic kidney disease with heart failure and stage 1 through stage 4 chronic kidney disease, or unspecified chronic kidney disease: Secondary | ICD-10-CM | POA: Diagnosis not present

## 2018-06-07 DIAGNOSIS — B9689 Other specified bacterial agents as the cause of diseases classified elsewhere: Secondary | ICD-10-CM | POA: Diagnosis not present

## 2018-06-07 DIAGNOSIS — I361 Nonrheumatic tricuspid (valve) insufficiency: Secondary | ICD-10-CM | POA: Diagnosis present

## 2018-06-07 DIAGNOSIS — J449 Chronic obstructive pulmonary disease, unspecified: Secondary | ICD-10-CM | POA: Diagnosis present

## 2018-06-07 DIAGNOSIS — I5030 Unspecified diastolic (congestive) heart failure: Secondary | ICD-10-CM | POA: Diagnosis not present

## 2018-06-07 DIAGNOSIS — R0902 Hypoxemia: Secondary | ICD-10-CM | POA: Diagnosis not present

## 2018-06-07 DIAGNOSIS — I5032 Chronic diastolic (congestive) heart failure: Secondary | ICD-10-CM | POA: Diagnosis not present

## 2018-06-07 DIAGNOSIS — E119 Type 2 diabetes mellitus without complications: Secondary | ICD-10-CM | POA: Diagnosis not present

## 2018-06-07 DIAGNOSIS — I447 Left bundle-branch block, unspecified: Secondary | ICD-10-CM | POA: Diagnosis present

## 2018-06-07 DIAGNOSIS — Z950 Presence of cardiac pacemaker: Secondary | ICD-10-CM | POA: Diagnosis not present

## 2018-06-07 DIAGNOSIS — Z7984 Long term (current) use of oral hypoglycemic drugs: Secondary | ICD-10-CM | POA: Diagnosis not present

## 2018-06-07 DIAGNOSIS — E039 Hypothyroidism, unspecified: Secondary | ICD-10-CM | POA: Diagnosis present

## 2018-06-07 DIAGNOSIS — I5189 Other ill-defined heart diseases: Secondary | ICD-10-CM | POA: Diagnosis not present

## 2018-06-07 DIAGNOSIS — J9601 Acute respiratory failure with hypoxia: Secondary | ICD-10-CM | POA: Diagnosis not present

## 2018-06-07 DIAGNOSIS — N390 Urinary tract infection, site not specified: Secondary | ICD-10-CM | POA: Diagnosis not present

## 2018-06-07 DIAGNOSIS — K922 Gastrointestinal hemorrhage, unspecified: Secondary | ICD-10-CM | POA: Diagnosis not present

## 2018-06-08 DIAGNOSIS — A4150 Gram-negative sepsis, unspecified: Secondary | ICD-10-CM

## 2018-06-08 DIAGNOSIS — R7881 Bacteremia: Secondary | ICD-10-CM

## 2018-06-11 DIAGNOSIS — R0902 Hypoxemia: Secondary | ICD-10-CM | POA: Diagnosis not present

## 2018-06-11 DIAGNOSIS — K922 Gastrointestinal hemorrhage, unspecified: Secondary | ICD-10-CM | POA: Diagnosis not present

## 2018-06-11 DIAGNOSIS — B9562 Methicillin resistant Staphylococcus aureus infection as the cause of diseases classified elsewhere: Secondary | ICD-10-CM | POA: Diagnosis not present

## 2018-06-11 DIAGNOSIS — I130 Hypertensive heart and chronic kidney disease with heart failure and stage 1 through stage 4 chronic kidney disease, or unspecified chronic kidney disease: Secondary | ICD-10-CM | POA: Diagnosis not present

## 2018-06-11 DIAGNOSIS — Z1612 Extended spectrum beta lactamase (ESBL) resistance: Secondary | ICD-10-CM | POA: Diagnosis not present

## 2018-06-11 DIAGNOSIS — E039 Hypothyroidism, unspecified: Secondary | ICD-10-CM | POA: Diagnosis not present

## 2018-06-11 DIAGNOSIS — A4150 Gram-negative sepsis, unspecified: Secondary | ICD-10-CM | POA: Diagnosis not present

## 2018-06-11 DIAGNOSIS — E876 Hypokalemia: Secondary | ICD-10-CM | POA: Diagnosis not present

## 2018-06-11 DIAGNOSIS — N179 Acute kidney failure, unspecified: Secondary | ICD-10-CM | POA: Diagnosis not present

## 2018-06-11 DIAGNOSIS — L988 Other specified disorders of the skin and subcutaneous tissue: Secondary | ICD-10-CM | POA: Diagnosis not present

## 2018-06-11 DIAGNOSIS — A419 Sepsis, unspecified organism: Secondary | ICD-10-CM | POA: Diagnosis not present

## 2018-06-11 DIAGNOSIS — Z452 Encounter for adjustment and management of vascular access device: Secondary | ICD-10-CM | POA: Diagnosis not present

## 2018-06-11 DIAGNOSIS — E119 Type 2 diabetes mellitus without complications: Secondary | ICD-10-CM | POA: Diagnosis not present

## 2018-06-11 DIAGNOSIS — I5032 Chronic diastolic (congestive) heart failure: Secondary | ICD-10-CM | POA: Diagnosis not present

## 2018-06-11 DIAGNOSIS — E46 Unspecified protein-calorie malnutrition: Secondary | ICD-10-CM | POA: Diagnosis not present

## 2018-06-11 DIAGNOSIS — A4151 Sepsis due to Escherichia coli [E. coli]: Secondary | ICD-10-CM | POA: Diagnosis not present

## 2018-06-11 DIAGNOSIS — R262 Difficulty in walking, not elsewhere classified: Secondary | ICD-10-CM | POA: Diagnosis not present

## 2018-06-11 DIAGNOSIS — Z7401 Bed confinement status: Secondary | ICD-10-CM | POA: Diagnosis not present

## 2018-06-11 DIAGNOSIS — N390 Urinary tract infection, site not specified: Secondary | ICD-10-CM | POA: Diagnosis not present

## 2018-06-11 DIAGNOSIS — I5030 Unspecified diastolic (congestive) heart failure: Secondary | ICD-10-CM | POA: Diagnosis not present

## 2018-06-11 DIAGNOSIS — J9601 Acute respiratory failure with hypoxia: Secondary | ICD-10-CM | POA: Diagnosis not present

## 2018-06-11 DIAGNOSIS — J449 Chronic obstructive pulmonary disease, unspecified: Secondary | ICD-10-CM | POA: Diagnosis not present

## 2018-06-11 DIAGNOSIS — I10 Essential (primary) hypertension: Secondary | ICD-10-CM | POA: Diagnosis not present

## 2018-06-11 DIAGNOSIS — R7881 Bacteremia: Secondary | ICD-10-CM | POA: Diagnosis not present

## 2018-06-11 DIAGNOSIS — E1151 Type 2 diabetes mellitus with diabetic peripheral angiopathy without gangrene: Secondary | ICD-10-CM | POA: Diagnosis not present

## 2018-06-11 DIAGNOSIS — E871 Hypo-osmolality and hyponatremia: Secondary | ICD-10-CM | POA: Diagnosis not present

## 2018-06-14 DIAGNOSIS — N390 Urinary tract infection, site not specified: Secondary | ICD-10-CM | POA: Diagnosis not present

## 2018-06-14 DIAGNOSIS — A4150 Gram-negative sepsis, unspecified: Secondary | ICD-10-CM | POA: Diagnosis not present

## 2018-06-14 DIAGNOSIS — R262 Difficulty in walking, not elsewhere classified: Secondary | ICD-10-CM | POA: Diagnosis not present

## 2018-06-14 DIAGNOSIS — E1151 Type 2 diabetes mellitus with diabetic peripheral angiopathy without gangrene: Secondary | ICD-10-CM | POA: Diagnosis not present

## 2018-06-18 DIAGNOSIS — L988 Other specified disorders of the skin and subcutaneous tissue: Secondary | ICD-10-CM | POA: Diagnosis not present

## 2018-06-25 DIAGNOSIS — L988 Other specified disorders of the skin and subcutaneous tissue: Secondary | ICD-10-CM | POA: Diagnosis not present

## 2018-06-27 ENCOUNTER — Other Ambulatory Visit: Payer: Self-pay | Admitting: *Deleted

## 2018-06-27 DIAGNOSIS — Z90710 Acquired absence of both cervix and uterus: Secondary | ICD-10-CM | POA: Diagnosis not present

## 2018-06-27 DIAGNOSIS — F419 Anxiety disorder, unspecified: Secondary | ICD-10-CM | POA: Diagnosis not present

## 2018-06-27 DIAGNOSIS — Z7982 Long term (current) use of aspirin: Secondary | ICD-10-CM | POA: Diagnosis not present

## 2018-06-27 DIAGNOSIS — Z9181 History of falling: Secondary | ICD-10-CM | POA: Diagnosis not present

## 2018-06-27 DIAGNOSIS — E039 Hypothyroidism, unspecified: Secondary | ICD-10-CM | POA: Diagnosis not present

## 2018-06-27 DIAGNOSIS — M159 Polyosteoarthritis, unspecified: Secondary | ICD-10-CM | POA: Diagnosis not present

## 2018-06-27 DIAGNOSIS — L89322 Pressure ulcer of left buttock, stage 2: Secondary | ICD-10-CM | POA: Diagnosis not present

## 2018-06-27 DIAGNOSIS — I130 Hypertensive heart and chronic kidney disease with heart failure and stage 1 through stage 4 chronic kidney disease, or unspecified chronic kidney disease: Secondary | ICD-10-CM | POA: Diagnosis not present

## 2018-06-27 DIAGNOSIS — Z6841 Body Mass Index (BMI) 40.0 and over, adult: Secondary | ICD-10-CM | POA: Diagnosis not present

## 2018-06-27 DIAGNOSIS — L89312 Pressure ulcer of right buttock, stage 2: Secondary | ICD-10-CM | POA: Diagnosis not present

## 2018-06-27 DIAGNOSIS — N390 Urinary tract infection, site not specified: Secondary | ICD-10-CM | POA: Diagnosis not present

## 2018-06-27 DIAGNOSIS — I5032 Chronic diastolic (congestive) heart failure: Secondary | ICD-10-CM | POA: Diagnosis not present

## 2018-06-27 DIAGNOSIS — Z794 Long term (current) use of insulin: Secondary | ICD-10-CM | POA: Diagnosis not present

## 2018-06-27 DIAGNOSIS — B9620 Unspecified Escherichia coli [E. coli] as the cause of diseases classified elsewhere: Secondary | ICD-10-CM | POA: Diagnosis not present

## 2018-06-27 DIAGNOSIS — M549 Dorsalgia, unspecified: Secondary | ICD-10-CM | POA: Diagnosis not present

## 2018-06-27 DIAGNOSIS — L89152 Pressure ulcer of sacral region, stage 2: Secondary | ICD-10-CM | POA: Diagnosis not present

## 2018-06-27 DIAGNOSIS — N183 Chronic kidney disease, stage 3 (moderate): Secondary | ICD-10-CM | POA: Diagnosis not present

## 2018-06-27 DIAGNOSIS — Z950 Presence of cardiac pacemaker: Secondary | ICD-10-CM | POA: Diagnosis not present

## 2018-06-27 DIAGNOSIS — E1122 Type 2 diabetes mellitus with diabetic chronic kidney disease: Secondary | ICD-10-CM | POA: Diagnosis not present

## 2018-06-27 DIAGNOSIS — E1140 Type 2 diabetes mellitus with diabetic neuropathy, unspecified: Secondary | ICD-10-CM | POA: Diagnosis not present

## 2018-06-27 DIAGNOSIS — G8929 Other chronic pain: Secondary | ICD-10-CM | POA: Diagnosis not present

## 2018-06-27 DIAGNOSIS — D631 Anemia in chronic kidney disease: Secondary | ICD-10-CM | POA: Diagnosis not present

## 2018-06-27 DIAGNOSIS — Z87891 Personal history of nicotine dependence: Secondary | ICD-10-CM | POA: Diagnosis not present

## 2018-06-27 DIAGNOSIS — F329 Major depressive disorder, single episode, unspecified: Secondary | ICD-10-CM | POA: Diagnosis not present

## 2018-06-27 NOTE — Patient Outreach (Signed)
[facility]) Care Management  06/27/2018  Danielle Dennis [birth year] [id number]    Collaboration with [facility] UM team member concerning patient's progress, discharge plan and potential care management needs. Patient admitted to SNF on 06/11/18 after a hospitalization for sepsis. Planned discharge date is 06/27/18 and discharge disposition is ALF.  Patient was evaluated for community based chronic disease management services with [facility] care Management Program as a benefit of patient's Next Gen Medicare.   Collaboration with [place] reveals patient's discharge plan is ALF and there were no identifiable [facility] care management needs. [facility] Care Management services not appropriate at this time. For questions please contact:   Srihaan Mastrangelo RN, [facility] Liaison  [phone]) Business Mobile [phone]) Toll free office

## 2018-06-28 DIAGNOSIS — B9620 Unspecified Escherichia coli [E. coli] as the cause of diseases classified elsewhere: Secondary | ICD-10-CM | POA: Diagnosis not present

## 2018-06-28 DIAGNOSIS — I5032 Chronic diastolic (congestive) heart failure: Secondary | ICD-10-CM | POA: Diagnosis not present

## 2018-06-28 DIAGNOSIS — N390 Urinary tract infection, site not specified: Secondary | ICD-10-CM | POA: Diagnosis not present

## 2018-06-28 DIAGNOSIS — L89312 Pressure ulcer of right buttock, stage 2: Secondary | ICD-10-CM | POA: Diagnosis not present

## 2018-06-28 DIAGNOSIS — E1122 Type 2 diabetes mellitus with diabetic chronic kidney disease: Secondary | ICD-10-CM | POA: Diagnosis not present

## 2018-06-28 DIAGNOSIS — I130 Hypertensive heart and chronic kidney disease with heart failure and stage 1 through stage 4 chronic kidney disease, or unspecified chronic kidney disease: Secondary | ICD-10-CM | POA: Diagnosis not present

## 2018-06-29 DIAGNOSIS — N132 Hydronephrosis with renal and ureteral calculous obstruction: Secondary | ICD-10-CM | POA: Diagnosis not present

## 2018-06-29 DIAGNOSIS — R531 Weakness: Secondary | ICD-10-CM | POA: Diagnosis not present

## 2018-06-29 DIAGNOSIS — R1084 Generalized abdominal pain: Secondary | ICD-10-CM | POA: Diagnosis not present

## 2018-06-29 DIAGNOSIS — N135 Crossing vessel and stricture of ureter without hydronephrosis: Secondary | ICD-10-CM | POA: Diagnosis not present

## 2018-06-29 DIAGNOSIS — N3001 Acute cystitis with hematuria: Secondary | ICD-10-CM | POA: Diagnosis not present

## 2018-06-29 DIAGNOSIS — R0902 Hypoxemia: Secondary | ICD-10-CM | POA: Diagnosis not present

## 2018-06-29 DIAGNOSIS — T68XXXA Hypothermia, initial encounter: Secondary | ICD-10-CM | POA: Diagnosis not present

## 2018-06-29 DIAGNOSIS — R1032 Left lower quadrant pain: Secondary | ICD-10-CM | POA: Diagnosis not present

## 2018-06-29 DIAGNOSIS — R52 Pain, unspecified: Secondary | ICD-10-CM | POA: Diagnosis not present

## 2018-06-29 DIAGNOSIS — R339 Retention of urine, unspecified: Secondary | ICD-10-CM | POA: Diagnosis not present

## 2018-06-29 DIAGNOSIS — R918 Other nonspecific abnormal finding of lung field: Secondary | ICD-10-CM | POA: Diagnosis not present

## 2018-07-01 DIAGNOSIS — I130 Hypertensive heart and chronic kidney disease with heart failure and stage 1 through stage 4 chronic kidney disease, or unspecified chronic kidney disease: Secondary | ICD-10-CM | POA: Diagnosis not present

## 2018-07-01 DIAGNOSIS — N390 Urinary tract infection, site not specified: Secondary | ICD-10-CM | POA: Diagnosis not present

## 2018-07-01 DIAGNOSIS — E1122 Type 2 diabetes mellitus with diabetic chronic kidney disease: Secondary | ICD-10-CM | POA: Diagnosis not present

## 2018-07-01 DIAGNOSIS — L89312 Pressure ulcer of right buttock, stage 2: Secondary | ICD-10-CM | POA: Diagnosis not present

## 2018-07-01 DIAGNOSIS — I5032 Chronic diastolic (congestive) heart failure: Secondary | ICD-10-CM | POA: Diagnosis not present

## 2018-07-01 DIAGNOSIS — B9620 Unspecified Escherichia coli [E. coli] as the cause of diseases classified elsewhere: Secondary | ICD-10-CM | POA: Diagnosis not present

## 2018-07-02 DIAGNOSIS — E860 Dehydration: Secondary | ICD-10-CM | POA: Diagnosis not present

## 2018-07-02 DIAGNOSIS — R52 Pain, unspecified: Secondary | ICD-10-CM | POA: Diagnosis not present

## 2018-07-02 DIAGNOSIS — N3000 Acute cystitis without hematuria: Secondary | ICD-10-CM | POA: Diagnosis not present

## 2018-07-02 DIAGNOSIS — R29898 Other symptoms and signs involving the musculoskeletal system: Secondary | ICD-10-CM | POA: Diagnosis not present

## 2018-07-02 DIAGNOSIS — R0902 Hypoxemia: Secondary | ICD-10-CM | POA: Diagnosis not present

## 2018-07-02 DIAGNOSIS — N135 Crossing vessel and stricture of ureter without hydronephrosis: Secondary | ICD-10-CM | POA: Diagnosis not present

## 2018-07-02 DIAGNOSIS — N3010 Interstitial cystitis (chronic) without hematuria: Secondary | ICD-10-CM | POA: Diagnosis not present

## 2018-07-02 DIAGNOSIS — J9811 Atelectasis: Secondary | ICD-10-CM | POA: Diagnosis not present

## 2018-07-02 DIAGNOSIS — M2550 Pain in unspecified joint: Secondary | ICD-10-CM | POA: Diagnosis not present

## 2018-07-02 DIAGNOSIS — N289 Disorder of kidney and ureter, unspecified: Secondary | ICD-10-CM | POA: Diagnosis not present

## 2018-07-02 DIAGNOSIS — R531 Weakness: Secondary | ICD-10-CM | POA: Diagnosis not present

## 2018-07-02 DIAGNOSIS — Z7401 Bed confinement status: Secondary | ICD-10-CM | POA: Diagnosis not present

## 2018-07-02 DIAGNOSIS — N318 Other neuromuscular dysfunction of bladder: Secondary | ICD-10-CM | POA: Diagnosis not present

## 2018-07-02 DIAGNOSIS — B9689 Other specified bacterial agents as the cause of diseases classified elsewhere: Secondary | ICD-10-CM | POA: Diagnosis not present

## 2018-07-03 DIAGNOSIS — E860 Dehydration: Secondary | ICD-10-CM | POA: Diagnosis not present

## 2018-07-03 DIAGNOSIS — N289 Disorder of kidney and ureter, unspecified: Secondary | ICD-10-CM | POA: Diagnosis not present

## 2018-07-03 DIAGNOSIS — R29898 Other symptoms and signs involving the musculoskeletal system: Secondary | ICD-10-CM | POA: Diagnosis not present

## 2018-07-03 DIAGNOSIS — J9811 Atelectasis: Secondary | ICD-10-CM | POA: Diagnosis not present

## 2018-07-03 DIAGNOSIS — I10 Essential (primary) hypertension: Secondary | ICD-10-CM | POA: Diagnosis not present

## 2018-07-03 DIAGNOSIS — R0902 Hypoxemia: Secondary | ICD-10-CM | POA: Diagnosis not present

## 2018-07-03 DIAGNOSIS — R531 Weakness: Secondary | ICD-10-CM | POA: Diagnosis not present

## 2018-07-03 DIAGNOSIS — N390 Urinary tract infection, site not specified: Secondary | ICD-10-CM | POA: Diagnosis not present

## 2018-07-04 DIAGNOSIS — K5730 Diverticulosis of large intestine without perforation or abscess without bleeding: Secondary | ICD-10-CM | POA: Diagnosis not present

## 2018-07-04 DIAGNOSIS — K921 Melena: Secondary | ICD-10-CM | POA: Diagnosis not present

## 2018-07-04 DIAGNOSIS — I5032 Chronic diastolic (congestive) heart failure: Secondary | ICD-10-CM | POA: Diagnosis not present

## 2018-07-04 DIAGNOSIS — Z79899 Other long term (current) drug therapy: Secondary | ICD-10-CM | POA: Diagnosis not present

## 2018-07-04 DIAGNOSIS — Z950 Presence of cardiac pacemaker: Secondary | ICD-10-CM | POA: Diagnosis not present

## 2018-07-04 DIAGNOSIS — I110 Hypertensive heart disease with heart failure: Secondary | ICD-10-CM | POA: Diagnosis present

## 2018-07-04 DIAGNOSIS — K219 Gastro-esophageal reflux disease without esophagitis: Secondary | ICD-10-CM | POA: Diagnosis present

## 2018-07-04 DIAGNOSIS — I5189 Other ill-defined heart diseases: Secondary | ICD-10-CM | POA: Diagnosis not present

## 2018-07-04 DIAGNOSIS — B9620 Unspecified Escherichia coli [E. coli] as the cause of diseases classified elsewhere: Secondary | ICD-10-CM | POA: Diagnosis present

## 2018-07-04 DIAGNOSIS — N179 Acute kidney failure, unspecified: Secondary | ICD-10-CM | POA: Diagnosis not present

## 2018-07-04 DIAGNOSIS — I10 Essential (primary) hypertension: Secondary | ICD-10-CM | POA: Diagnosis not present

## 2018-07-04 DIAGNOSIS — N132 Hydronephrosis with renal and ureteral calculous obstruction: Secondary | ICD-10-CM | POA: Diagnosis not present

## 2018-07-04 DIAGNOSIS — E860 Dehydration: Secondary | ICD-10-CM | POA: Diagnosis not present

## 2018-07-04 DIAGNOSIS — B952 Enterococcus as the cause of diseases classified elsewhere: Secondary | ICD-10-CM | POA: Diagnosis present

## 2018-07-04 DIAGNOSIS — R531 Weakness: Secondary | ICD-10-CM | POA: Diagnosis present

## 2018-07-04 DIAGNOSIS — Z7982 Long term (current) use of aspirin: Secondary | ICD-10-CM | POA: Diagnosis not present

## 2018-07-04 DIAGNOSIS — E1165 Type 2 diabetes mellitus with hyperglycemia: Secondary | ICD-10-CM | POA: Diagnosis present

## 2018-07-04 DIAGNOSIS — Z66 Do not resuscitate: Secondary | ICD-10-CM | POA: Diagnosis present

## 2018-07-04 DIAGNOSIS — Z885 Allergy status to narcotic agent status: Secondary | ICD-10-CM | POA: Diagnosis not present

## 2018-07-04 DIAGNOSIS — R29898 Other symptoms and signs involving the musculoskeletal system: Secondary | ICD-10-CM | POA: Diagnosis not present

## 2018-07-04 DIAGNOSIS — J9611 Chronic respiratory failure with hypoxia: Secondary | ICD-10-CM | POA: Diagnosis present

## 2018-07-04 DIAGNOSIS — N390 Urinary tract infection, site not specified: Secondary | ICD-10-CM | POA: Diagnosis present

## 2018-07-04 DIAGNOSIS — N1330 Unspecified hydronephrosis: Secondary | ICD-10-CM | POA: Diagnosis present

## 2018-07-04 DIAGNOSIS — T83518A Infection and inflammatory reaction due to other urinary catheter, initial encounter: Secondary | ICD-10-CM | POA: Diagnosis present

## 2018-07-04 DIAGNOSIS — N289 Disorder of kidney and ureter, unspecified: Secondary | ICD-10-CM | POA: Diagnosis not present

## 2018-07-04 DIAGNOSIS — E876 Hypokalemia: Secondary | ICD-10-CM | POA: Diagnosis present

## 2018-07-04 DIAGNOSIS — E039 Hypothyroidism, unspecified: Secondary | ICD-10-CM | POA: Diagnosis present

## 2018-07-04 DIAGNOSIS — Z87440 Personal history of urinary (tract) infections: Secondary | ICD-10-CM | POA: Diagnosis not present

## 2018-07-04 DIAGNOSIS — J9811 Atelectasis: Secondary | ICD-10-CM | POA: Diagnosis not present

## 2018-07-04 DIAGNOSIS — J449 Chronic obstructive pulmonary disease, unspecified: Secondary | ICD-10-CM | POA: Diagnosis present

## 2018-07-04 DIAGNOSIS — E871 Hypo-osmolality and hyponatremia: Secondary | ICD-10-CM | POA: Diagnosis present

## 2018-07-04 DIAGNOSIS — K920 Hematemesis: Secondary | ICD-10-CM | POA: Diagnosis not present

## 2018-07-04 DIAGNOSIS — R0902 Hypoxemia: Secondary | ICD-10-CM | POA: Diagnosis not present

## 2018-07-04 DIAGNOSIS — M1990 Unspecified osteoarthritis, unspecified site: Secondary | ICD-10-CM | POA: Diagnosis present

## 2018-07-07 ENCOUNTER — Inpatient Hospital Stay (HOSPITAL_COMMUNITY): Payer: Medicare Other

## 2018-07-07 ENCOUNTER — Inpatient Hospital Stay (HOSPITAL_COMMUNITY)
Admission: AD | Admit: 2018-07-07 | Discharge: 2018-07-19 | DRG: 987 | Disposition: A | Discharge status: Skilled Nursing Facility | Payer: Medicare Other | Source: Other Acute Inpatient Hospital | Attending: Internal Medicine | Admitting: Internal Medicine

## 2018-07-07 ENCOUNTER — Encounter (HOSPITAL_COMMUNITY): Payer: Self-pay

## 2018-07-07 ENCOUNTER — Other Ambulatory Visit: Payer: Self-pay

## 2018-07-07 DIAGNOSIS — R140 Abdominal distension (gaseous): Secondary | ICD-10-CM | POA: Diagnosis not present

## 2018-07-07 DIAGNOSIS — N289 Disorder of kidney and ureter, unspecified: Secondary | ICD-10-CM | POA: Diagnosis not present

## 2018-07-07 DIAGNOSIS — R410 Disorientation, unspecified: Secondary | ICD-10-CM | POA: Diagnosis not present

## 2018-07-07 DIAGNOSIS — Z79891 Long term (current) use of opiate analgesic: Secondary | ICD-10-CM

## 2018-07-07 DIAGNOSIS — E871 Hypo-osmolality and hyponatremia: Secondary | ICD-10-CM | POA: Diagnosis present

## 2018-07-07 DIAGNOSIS — E039 Hypothyroidism, unspecified: Secondary | ICD-10-CM | POA: Diagnosis present

## 2018-07-07 DIAGNOSIS — I5030 Unspecified diastolic (congestive) heart failure: Secondary | ICD-10-CM

## 2018-07-07 DIAGNOSIS — Z87440 Personal history of urinary (tract) infections: Secondary | ICD-10-CM | POA: Diagnosis not present

## 2018-07-07 DIAGNOSIS — G8929 Other chronic pain: Secondary | ICD-10-CM | POA: Diagnosis present

## 2018-07-07 DIAGNOSIS — I2609 Other pulmonary embolism with acute cor pulmonale: Secondary | ICD-10-CM | POA: Diagnosis not present

## 2018-07-07 DIAGNOSIS — I2699 Other pulmonary embolism without acute cor pulmonale: Secondary | ICD-10-CM | POA: Diagnosis present

## 2018-07-07 DIAGNOSIS — R7982 Elevated C-reactive protein (CRP): Secondary | ICD-10-CM | POA: Diagnosis present

## 2018-07-07 DIAGNOSIS — M4646 Discitis, unspecified, lumbar region: Secondary | ICD-10-CM | POA: Diagnosis present

## 2018-07-07 DIAGNOSIS — B3749 Other urogenital candidiasis: Secondary | ICD-10-CM | POA: Diagnosis present

## 2018-07-07 DIAGNOSIS — G9349 Other encephalopathy: Secondary | ICD-10-CM | POA: Diagnosis present

## 2018-07-07 DIAGNOSIS — B9629 Other Escherichia coli [E. coli] as the cause of diseases classified elsewhere: Secondary | ICD-10-CM

## 2018-07-07 DIAGNOSIS — E1165 Type 2 diabetes mellitus with hyperglycemia: Secondary | ICD-10-CM | POA: Diagnosis present

## 2018-07-07 DIAGNOSIS — J9621 Acute and chronic respiratory failure with hypoxia: Secondary | ICD-10-CM | POA: Diagnosis present

## 2018-07-07 DIAGNOSIS — R531 Weakness: Secondary | ICD-10-CM | POA: Diagnosis not present

## 2018-07-07 DIAGNOSIS — B49 Unspecified mycosis: Secondary | ICD-10-CM | POA: Diagnosis not present

## 2018-07-07 DIAGNOSIS — N132 Hydronephrosis with renal and ureteral calculous obstruction: Secondary | ICD-10-CM | POA: Diagnosis not present

## 2018-07-07 DIAGNOSIS — E119 Type 2 diabetes mellitus without complications: Secondary | ICD-10-CM

## 2018-07-07 DIAGNOSIS — R29898 Other symptoms and signs involving the musculoskeletal system: Secondary | ICD-10-CM | POA: Diagnosis not present

## 2018-07-07 DIAGNOSIS — M8580 Other specified disorders of bone density and structure, unspecified site: Secondary | ICD-10-CM | POA: Diagnosis not present

## 2018-07-07 DIAGNOSIS — R293 Abnormal posture: Secondary | ICD-10-CM | POA: Diagnosis not present

## 2018-07-07 DIAGNOSIS — M4626 Osteomyelitis of vertebra, lumbar region: Secondary | ICD-10-CM | POA: Diagnosis not present

## 2018-07-07 DIAGNOSIS — R0902 Hypoxemia: Secondary | ICD-10-CM | POA: Diagnosis not present

## 2018-07-07 DIAGNOSIS — N179 Acute kidney failure, unspecified: Secondary | ICD-10-CM | POA: Diagnosis present

## 2018-07-07 DIAGNOSIS — Z0389 Encounter for observation for other suspected diseases and conditions ruled out: Secondary | ICD-10-CM | POA: Diagnosis not present

## 2018-07-07 DIAGNOSIS — M2550 Pain in unspecified joint: Secondary | ICD-10-CM | POA: Diagnosis not present

## 2018-07-07 DIAGNOSIS — J440 Chronic obstructive pulmonary disease with acute lower respiratory infection: Secondary | ICD-10-CM | POA: Diagnosis not present

## 2018-07-07 DIAGNOSIS — I110 Hypertensive heart disease with heart failure: Secondary | ICD-10-CM | POA: Diagnosis present

## 2018-07-07 DIAGNOSIS — Z66 Do not resuscitate: Secondary | ICD-10-CM | POA: Diagnosis present

## 2018-07-07 DIAGNOSIS — Z885 Allergy status to narcotic agent status: Secondary | ICD-10-CM

## 2018-07-07 DIAGNOSIS — N200 Calculus of kidney: Secondary | ICD-10-CM | POA: Diagnosis not present

## 2018-07-07 DIAGNOSIS — N1330 Unspecified hydronephrosis: Secondary | ICD-10-CM | POA: Diagnosis present

## 2018-07-07 DIAGNOSIS — M6281 Muscle weakness (generalized): Secondary | ICD-10-CM | POA: Diagnosis not present

## 2018-07-07 DIAGNOSIS — Z79899 Other long term (current) drug therapy: Secondary | ICD-10-CM

## 2018-07-07 DIAGNOSIS — M545 Low back pain: Secondary | ICD-10-CM | POA: Diagnosis not present

## 2018-07-07 DIAGNOSIS — I5032 Chronic diastolic (congestive) heart failure: Secondary | ICD-10-CM | POA: Diagnosis present

## 2018-07-07 DIAGNOSIS — Z950 Presence of cardiac pacemaker: Secondary | ICD-10-CM

## 2018-07-07 DIAGNOSIS — Z7401 Bed confinement status: Secondary | ICD-10-CM | POA: Diagnosis not present

## 2018-07-07 DIAGNOSIS — Z1612 Extended spectrum beta lactamase (ESBL) resistance: Secondary | ICD-10-CM | POA: Diagnosis present

## 2018-07-07 DIAGNOSIS — J189 Pneumonia, unspecified organism: Secondary | ICD-10-CM | POA: Diagnosis not present

## 2018-07-07 DIAGNOSIS — K760 Fatty (change of) liver, not elsewhere classified: Secondary | ICD-10-CM | POA: Diagnosis not present

## 2018-07-07 DIAGNOSIS — K5289 Other specified noninfective gastroenteritis and colitis: Secondary | ICD-10-CM | POA: Diagnosis present

## 2018-07-07 DIAGNOSIS — E1169 Type 2 diabetes mellitus with other specified complication: Secondary | ICD-10-CM | POA: Diagnosis not present

## 2018-07-07 DIAGNOSIS — N390 Urinary tract infection, site not specified: Secondary | ICD-10-CM | POA: Diagnosis not present

## 2018-07-07 DIAGNOSIS — E872 Acidosis: Secondary | ICD-10-CM | POA: Diagnosis present

## 2018-07-07 DIAGNOSIS — Z8701 Personal history of pneumonia (recurrent): Secondary | ICD-10-CM | POA: Diagnosis not present

## 2018-07-07 DIAGNOSIS — B9620 Unspecified Escherichia coli [E. coli] as the cause of diseases classified elsewhere: Secondary | ICD-10-CM | POA: Diagnosis present

## 2018-07-07 DIAGNOSIS — R41841 Cognitive communication deficit: Secondary | ICD-10-CM | POA: Diagnosis not present

## 2018-07-07 DIAGNOSIS — Z22322 Carrier or suspected carrier of Methicillin resistant Staphylococcus aureus: Secondary | ICD-10-CM | POA: Diagnosis not present

## 2018-07-07 DIAGNOSIS — R05 Cough: Secondary | ICD-10-CM | POA: Diagnosis not present

## 2018-07-07 DIAGNOSIS — K922 Gastrointestinal hemorrhage, unspecified: Secondary | ICD-10-CM

## 2018-07-07 DIAGNOSIS — Z7982 Long term (current) use of aspirin: Secondary | ICD-10-CM

## 2018-07-07 DIAGNOSIS — I10 Essential (primary) hypertension: Secondary | ICD-10-CM | POA: Diagnosis not present

## 2018-07-07 DIAGNOSIS — Z794 Long term (current) use of insulin: Secondary | ICD-10-CM

## 2018-07-07 DIAGNOSIS — Y95 Nosocomial condition: Secondary | ICD-10-CM | POA: Diagnosis not present

## 2018-07-07 DIAGNOSIS — E876 Hypokalemia: Secondary | ICD-10-CM | POA: Diagnosis present

## 2018-07-07 DIAGNOSIS — I5189 Other ill-defined heart diseases: Secondary | ICD-10-CM | POA: Diagnosis not present

## 2018-07-07 DIAGNOSIS — R1312 Dysphagia, oropharyngeal phase: Secondary | ICD-10-CM | POA: Diagnosis not present

## 2018-07-07 DIAGNOSIS — Z79890 Hormone replacement therapy (postmenopausal): Secondary | ICD-10-CM

## 2018-07-07 DIAGNOSIS — J441 Chronic obstructive pulmonary disease with (acute) exacerbation: Secondary | ICD-10-CM | POA: Diagnosis not present

## 2018-07-07 DIAGNOSIS — E860 Dehydration: Secondary | ICD-10-CM | POA: Diagnosis not present

## 2018-07-07 DIAGNOSIS — J449 Chronic obstructive pulmonary disease, unspecified: Secondary | ICD-10-CM | POA: Diagnosis not present

## 2018-07-07 DIAGNOSIS — K219 Gastro-esophageal reflux disease without esophagitis: Secondary | ICD-10-CM | POA: Diagnosis present

## 2018-07-07 DIAGNOSIS — R262 Difficulty in walking, not elsewhere classified: Secondary | ICD-10-CM | POA: Diagnosis not present

## 2018-07-07 DIAGNOSIS — Z7951 Long term (current) use of inhaled steroids: Secondary | ICD-10-CM

## 2018-07-07 DIAGNOSIS — Z8619 Personal history of other infectious and parasitic diseases: Secondary | ICD-10-CM | POA: Diagnosis not present

## 2018-07-07 DIAGNOSIS — Z936 Other artificial openings of urinary tract status: Secondary | ICD-10-CM

## 2018-07-07 HISTORY — DX: Heart failure, unspecified: I50.9

## 2018-07-07 HISTORY — DX: Type 2 diabetes mellitus without complications: E11.9

## 2018-07-07 HISTORY — DX: Unspecified diastolic (congestive) heart failure: I50.30

## 2018-07-07 HISTORY — DX: Essential (primary) hypertension: I10

## 2018-07-07 HISTORY — DX: Gastrointestinal hemorrhage, unspecified: K92.2

## 2018-07-07 HISTORY — DX: Presence of cardiac pacemaker: Z95.0

## 2018-07-07 HISTORY — DX: Urinary tract infection, site not specified: B96.29

## 2018-07-07 HISTORY — DX: Gastro-esophageal reflux disease without esophagitis: K21.9

## 2018-07-07 HISTORY — DX: Hypothyroidism, unspecified: E03.9

## 2018-07-07 HISTORY — DX: Chronic obstructive pulmonary disease, unspecified: J44.9

## 2018-07-07 LAB — CBC
HCT: 33.7 % — ABNORMAL LOW (ref 36.0–46.0)
Hemoglobin: 10.5 g/dL — ABNORMAL LOW (ref 12.0–15.0)
MCH: 27.6 pg (ref 26.0–34.0)
MCHC: 31.2 g/dL (ref 30.0–36.0)
MCV: 88.7 fL (ref 80.0–100.0)
Platelets: 267 10*3/uL (ref 150–400)
RBC: 3.8 MIL/uL — ABNORMAL LOW (ref 3.87–5.11)
RDW: 16 % — ABNORMAL HIGH (ref 11.5–15.5)
WBC: 16.1 10*3/uL — ABNORMAL HIGH (ref 4.0–10.5)
nRBC: 0 % (ref 0.0–0.2)

## 2018-07-07 LAB — BLOOD GAS, VENOUS
Acid-Base Excess: 4 mmol/L — ABNORMAL HIGH (ref 0.0–2.0)
BICARBONATE: 28.4 mmol/L — AB (ref 20.0–28.0)
O2 Saturation: 37.6 %
Patient temperature: 98.6
pCO2, Ven: 44.1 mmHg (ref 44.0–60.0)
pH, Ven: 7.426 (ref 7.250–7.430)

## 2018-07-07 LAB — URINALYSIS, ROUTINE W REFLEX MICROSCOPIC
Bilirubin Urine: NEGATIVE
GLUCOSE, UA: NEGATIVE mg/dL
Ketones, ur: NEGATIVE mg/dL
NITRITE: NEGATIVE
PH: 5 (ref 5.0–8.0)
PROTEIN: NEGATIVE mg/dL
Specific Gravity, Urine: 1.012 (ref 1.005–1.030)
WBC, UA: >50 WBC/hpf — ABNORMAL HIGH (ref 0–5)

## 2018-07-07 LAB — COMPREHENSIVE METABOLIC PANEL
ALT: 16 U/L (ref 0–44)
AST: 18 U/L (ref 15–41)
Albumin: 2.6 g/dL — ABNORMAL LOW (ref 3.5–5.0)
Alkaline Phosphatase: 72 U/L (ref 38–126)
Anion gap: 9 (ref 5–15)
BUN: 41 mg/dL — ABNORMAL HIGH (ref 8–23)
CO2: 27 mmol/L (ref 22–32)
CREATININE: 1.13 mg/dL — AB (ref 0.44–1.00)
Calcium: 8.5 mg/dL — ABNORMAL LOW (ref 8.9–10.3)
Chloride: 101 mmol/L (ref 98–111)
GFR calc non Af Amer: 45 mL/min — ABNORMAL LOW (ref >60)
GFR, EST AFRICAN AMERICAN: 52 mL/min — AB (ref >60)
Glucose, Bld: 217 mg/dL — ABNORMAL HIGH (ref 70–99)
Potassium: 3.4 mmol/L — ABNORMAL LOW (ref 3.5–5.1)
Sodium: 137 mmol/L (ref 135–145)
Total Bilirubin: 0.6 mg/dL (ref 0.3–1.2)
Total Protein: 5.8 g/dL — ABNORMAL LOW (ref 6.5–8.1)

## 2018-07-07 LAB — AMMONIA: Ammonia: 13 umol/L (ref 9–35)

## 2018-07-07 LAB — HEMOGLOBIN: Hemoglobin: 10.2 g/dL — ABNORMAL LOW (ref 12.0–15.0)

## 2018-07-07 LAB — HEMATOCRIT: HCT: 33.1 % — ABNORMAL LOW (ref 36.0–46.0)

## 2018-07-07 LAB — GLUCOSE, CAPILLARY
Glucose-Capillary: 217 mg/dL — ABNORMAL HIGH (ref 70–99)
Glucose-Capillary: 129 mg/dL — ABNORMAL HIGH (ref 70–99)

## 2018-07-07 LAB — FOLATE: Folate: 8.8 ng/mL (ref >5.9)

## 2018-07-07 LAB — VITAMIN B12: Vitamin B-12: 1441 pg/mL — ABNORMAL HIGH (ref 180–914)

## 2018-07-07 LAB — TSH: TSH: 2.695 u[IU]/mL (ref 0.350–4.500)

## 2018-07-07 MED ORDER — BOOST / RESOURCE BREEZE PO LIQD CUSTOM
1.0000 | Freq: Three times a day (TID) | ORAL | Status: DC
  Administered 2018-07-07 – 2018-07-15 (×22): 1 via ORAL

## 2018-07-07 MED ORDER — PANTOPRAZOLE SODIUM 40 MG IV SOLR
40.0000 mg | Freq: Two times a day (BID) | INTRAVENOUS | Status: DC
  Administered 2018-07-07 – 2018-07-12 (×11): 40 mg via INTRAVENOUS
  Filled 2018-07-07 (×12): qty 40

## 2018-07-07 MED ORDER — SODIUM CHLORIDE 0.9 % IV SOLN
1.0000 g | INTRAVENOUS | Status: DC
  Administered 2018-07-07 – 2018-07-11 (×5): 1000 mg via INTRAVENOUS
  Filled 2018-07-07 (×5): qty 1

## 2018-07-07 MED ORDER — POLYVINYL ALCOHOL 1.4 % OP SOLN
1.0000 [drp] | Freq: Every day | OPHTHALMIC | Status: DC
  Administered 2018-07-07 – 2018-07-18 (×12): 1 [drp] via OPHTHALMIC
  Filled 2018-07-07: qty 15

## 2018-07-07 MED ORDER — INSULIN ASPART 100 UNIT/ML ~~LOC~~ SOLN
0.0000 [IU] | Freq: Three times a day (TID) | SUBCUTANEOUS | Status: DC
  Administered 2018-07-07: 3 [IU] via SUBCUTANEOUS
  Administered 2018-07-08 – 2018-07-09 (×5): 2 [IU] via SUBCUTANEOUS
  Administered 2018-07-09 – 2018-07-10 (×2): 3 [IU] via SUBCUTANEOUS
  Administered 2018-07-10 (×2): 2 [IU] via SUBCUTANEOUS
  Administered 2018-07-11: 3 [IU] via SUBCUTANEOUS
  Administered 2018-07-11: 2 [IU] via SUBCUTANEOUS
  Administered 2018-07-11: 5 [IU] via SUBCUTANEOUS
  Administered 2018-07-12 (×2): 2 [IU] via SUBCUTANEOUS
  Administered 2018-07-12: 3 [IU] via SUBCUTANEOUS
  Administered 2018-07-13: 2 [IU] via SUBCUTANEOUS
  Administered 2018-07-13: 3 [IU] via SUBCUTANEOUS
  Administered 2018-07-13 – 2018-07-15 (×5): 2 [IU] via SUBCUTANEOUS
  Administered 2018-07-15: 1 [IU] via SUBCUTANEOUS
  Administered 2018-07-15 – 2018-07-16 (×3): 2 [IU] via SUBCUTANEOUS
  Administered 2018-07-16 – 2018-07-17 (×4): 1 [IU] via SUBCUTANEOUS
  Administered 2018-07-18: 2 [IU] via SUBCUTANEOUS
  Administered 2018-07-18 – 2018-07-19 (×4): 1 [IU] via SUBCUTANEOUS

## 2018-07-07 MED ORDER — IOHEXOL 300 MG/ML  SOLN
75.0000 mL | Freq: Once | INTRAMUSCULAR | Status: AC | PRN
  Administered 2018-07-07: 75 mL via INTRAVENOUS

## 2018-07-07 MED ORDER — FLUTICASONE FUROATE-VILANTEROL 200-25 MCG/INH IN AEPB
1.0000 | INHALATION_SPRAY | Freq: Every day | RESPIRATORY_TRACT | Status: DC
  Filled 2018-07-07: qty 28

## 2018-07-07 MED ORDER — ONDANSETRON HCL 4 MG/2ML IJ SOLN: 4.0000 mg | Freq: Four times a day (QID) | INTRAMUSCULAR | Status: DC | PRN

## 2018-07-07 MED ORDER — LACTATED RINGERS IV SOLN
INTRAVENOUS | Status: AC
  Administered 2018-07-07 – 2018-07-08 (×2): via INTRAVENOUS

## 2018-07-07 MED ORDER — SODIUM CHLORIDE (PF) 0.9 % IJ SOLN
INTRAMUSCULAR | Status: AC
  Filled 2018-07-07: qty 50

## 2018-07-07 MED ORDER — BUDESONIDE 0.25 MG/2ML IN SUSP
0.2500 mg | Freq: Two times a day (BID) | RESPIRATORY_TRACT | Status: DC
  Administered 2018-07-07 – 2018-07-19 (×23): 0.25 mg via RESPIRATORY_TRACT
  Filled 2018-07-07 (×25): qty 2

## 2018-07-07 MED ORDER — LEVOTHYROXINE SODIUM 25 MCG PO TABS
125.0000 ug | ORAL_TABLET | Freq: Every day | ORAL | Status: DC
  Administered 2018-07-08 – 2018-07-19 (×12): 125 ug via ORAL
  Filled 2018-07-07 (×12): qty 1

## 2018-07-07 MED ORDER — NYSTATIN 100000 UNIT/GM EX POWD
Freq: Three times a day (TID) | CUTANEOUS | Status: DC
  Administered 2018-07-07 – 2018-07-19 (×35): via TOPICAL
  Filled 2018-07-07: qty 15

## 2018-07-07 MED ORDER — INSULIN ASPART 100 UNIT/ML ~~LOC~~ SOLN
0.0000 [IU] | Freq: Every day | SUBCUTANEOUS | Status: DC
  Administered 2018-07-08: 4 [IU] via SUBCUTANEOUS
  Administered 2018-07-11: 3 [IU] via SUBCUTANEOUS

## 2018-07-07 MED ORDER — IOHEXOL 300 MG/ML  SOLN
30.0000 mL | Freq: Once | INTRAMUSCULAR | Status: AC | PRN
  Administered 2018-07-07: 30 mL via ORAL

## 2018-07-07 MED ORDER — ONDANSETRON HCL 4 MG PO TABS
4.0000 mg | ORAL_TABLET | Freq: Four times a day (QID) | ORAL | Status: DC | PRN
  Filled 2018-07-07: qty 1

## 2018-07-07 MED ORDER — POLYETHYLENE GLYCOL 3350 17 G PO PACK: 17.0000 g | PACK | Freq: Every day | ORAL | Status: DC | PRN

## 2018-07-07 MED ORDER — POTASSIUM CHLORIDE CRYS ER 20 MEQ PO TBCR
40.0000 meq | EXTENDED_RELEASE_TABLET | Freq: Once | ORAL | Status: AC
  Administered 2018-07-07: 40 meq via ORAL
  Filled 2018-07-07: qty 2

## 2018-07-07 MED ORDER — ACETAMINOPHEN 325 MG PO TABS
650.0000 mg | ORAL_TABLET | Freq: Four times a day (QID) | ORAL | Status: DC | PRN
  Administered 2018-07-07 – 2018-07-16 (×8): 650 mg via ORAL
  Filled 2018-07-07 (×8): qty 2

## 2018-07-07 MED ORDER — ACETAMINOPHEN 650 MG RE SUPP: 650.0000 mg | Freq: Four times a day (QID) | RECTAL | Status: DC | PRN

## 2018-07-07 MED ORDER — ARFORMOTEROL TARTRATE 15 MCG/2ML IN NEBU
15.0000 ug | INHALATION_SOLUTION | Freq: Two times a day (BID) | RESPIRATORY_TRACT | Status: DC
  Administered 2018-07-07 – 2018-07-19 (×23): 15 ug via RESPIRATORY_TRACT
  Filled 2018-07-07 (×25): qty 2

## 2018-07-07 MED ORDER — ORAL CARE MOUTH RINSE
15.0000 mL | Freq: Two times a day (BID) | OROMUCOSAL | Status: DC
  Administered 2018-07-07 – 2018-07-19 (×22): 15 mL via OROMUCOSAL

## 2018-07-07 MED ORDER — ALBUTEROL SULFATE (2.5 MG/3ML) 0.083% IN NEBU: 2.5000 mg | INHALATION_SOLUTION | Freq: Four times a day (QID) | RESPIRATORY_TRACT | Status: DC | PRN

## 2018-07-07 NOTE — Consult Note (Signed)
[place] Reason for consult: GI bleeding.  Transferred up from [facility] in [place] due to no GI coverage. Referring Physician: [facility] hospital[facility].  GI: Has been seen in the past in [place].  Danielle Dennis is an 85 y.o. female.  HPI: History obtained from records and from the patient's daughter.  She has been a resident of assisted living and was recently hospitalized at [facility] for UTI.  This was due to ESBL E. coli and enterococcus.  She does have an indwelling Foley.  She was to be discharged to skilled nursing home but while she was in the hospital in [place] had a large bowel movement with blood.  Due to lack of GI coverage she was transferred to [facility].  Her daughter states that she had a colonoscopy several years ago and was told everything was okay.  According to the records she is on ranitidine and the daughter states that she has been receiving Mylanta on a regular basis at the assisted living facility.  Since coming to the hospital here in [place] she has had a brown bowel movement with no obvious bright blood but apparently has vomited up some emesis it was coffee-ground.  Lab work has shown hemoglobin of 10.5 but WBC elevated at 16.1.  LFTs are grossly normal.  BUN and creatinine both slightly elevated.  Daughter is unaware of any past history of ulcer disease.  Patient has been complaining of some abdominal pain. Her chronic medical problems include weakness and deconditioning has been in assisted living.  She is hydronephrosis and indwelling Foley with recent urinary tract infection.  She has COPD, hypothyroidism hypertension GERD.  While in [place] she had a CT showing a hiatal hernia.  Past Medical History:  Diagnosis Date  . Diabetes mellitus without complication ([place])   . Hypertension   . Presence of permanent cardiac pacemaker     History reviewed. No pertinent surgical history.  History reviewed. No pertinent family  history.  Social History:  reports that she has never smoked. She has never used smokeless tobacco. She reports previous alcohol use. She reports previous drug use.  Allergies:  Allergies  Allergen Reactions  . Codeine Other (See Comments)    Per MAR    Medications; Prior to Admission medications   Medication Sig Start Date End Date Taking? Authorizing Provider  amLODipine (NORVASC) 5 MG tablet Take 5 mg by mouth daily.   Yes [provider]  amoxicillin-clavulanate (AUGMENTIN) 875-125 MG tablet Take 1 tablet by mouth 2 (two) times daily.   Yes [provider]  aspirin 81 MG chewable tablet Chew 81 mg by mouth daily.   Yes [provider]  bumetanide (BUMEX) 2 MG tablet Take 4-6 mg by mouth See admin instructions. Take 6 mg by mouth in the morning and 4 mg at night   Yes [provider]  estradiol (ESTRACE) 1 MG tablet Take 1 mg by mouth daily.   Yes [provider]  fluticasone furoate-vilanterol (BREO ELLIPTA) 100-25 MCG/INH AEPB Inhale 1 puff into the lungs daily.   Yes [provider]  hydrALAZINE (APRESOLINE) 25 MG tablet Take 25 mg by mouth 3 (three) times daily.   Yes [provider]  HYDROcodone-acetaminophen (NORCO/VICODIN) 5-325 MG tablet Take 1 tablet by mouth 3 (three) times daily.   Yes [provider]  insulin regular (NOVOLIN R,HUMULIN R) 100 units/mL injection Inject 0-10 Units into the skin 4 (four) times daily -  with meals and at bedtime. Per sliding scale: <  150 = 0 units 150-200 = 2 units 201-249 = 4 units 250-300 = 6 units 301-349 = 8 units 350-400 = 10 units >400 = Notify MD   Yes [provider]  levothyroxine (SYNTHROID, LEVOTHROID) 125 MCG tablet Take 125 mcg by mouth daily before breakfast.   Yes [provider]  magnesium hydroxide (MILK OF MAGNESIA) 400 MG/5ML suspension Take 30 mLs by mouth daily as needed for mild constipation.   Yes [provider]   metolazone (ZAROXOLYN) 2.5 MG tablet Take 2.5 mg by mouth 2 (two) times a week. On Tuesday and Friday   Yes [provider]  Multiple Vitamins-Minerals (CEROVITE SENIOR) TABS Take 1 tablet by mouth daily.   Yes [provider]  pantoprazole (PROTONIX) 40 MG tablet Take 40 mg by mouth daily.   Yes [provider]  polyvinyl alcohol (LIQUIFILM TEARS) 1.4 % ophthalmic solution Place 1 drop into both eyes at bedtime.   Yes [provider]  potassium chloride SA (K-DUR,KLOR-CON) 20 MEQ tablet Take 40 mEq by mouth 2 (two) times daily.    Yes [provider]  sitaGLIPtin (JANUVIA) 50 MG tablet Take 50 mg by mouth daily.   Yes [provider]   . arformoterol  15 mcg Nebulization BID  . budesonide (PULMICORT) nebulizer solution  0.25 mg Nebulization BID  . feeding supplement  1 Container Oral TID BM  . insulin aspart  0-5 Units Subcutaneous QHS  . insulin aspart  0-9 Units Subcutaneous TID WC  . [START ON 07/08/2018] levothyroxine  125 mcg Oral Q0600  . mouth rinse  15 mL Mouth Rinse BID  . nystatin   Topical TID  . pantoprazole (PROTONIX) IV  40 mg Intravenous Q12H   PRN Meds acetaminophen **OR** acetaminophen, albuterol, ondansetron **OR** ondansetron (ZOFRAN) IV, polyethylene glycol Results for orders placed or performed during the hospital encounter of 07/07/18 (from the past 48 hour(s))  CBC     Status: Abnormal   Collection Time: 07/07/18  1:47 PM  Result Value Ref Range   WBC 16.1 (H) 4.0 - 10.5 K/uL   RBC 3.80 (L) 3.87 - 5.11 MIL/uL   Hemoglobin 10.5 (L) 12.0 - 15.0 g/dL   HCT 33.7 (L) 36.0 - 46.0 %   MCV 88.7 80.0 - 100.0 fL   MCH 27.6 26.0 - 34.0 pg   MCHC 31.2 30.0 - 36.0 g/dL   RDW 16.0 (H) 11.5 - 15.5 %   Platelets 267 150 - 400 K/uL   nRBC 0.0 0.0 - 0.2 %    Comment: Performed at [facility], [place] [address], [place], [place] [address]  Comprehensive metabolic panel     Status: Abnormal   Collection  Time: 07/07/18  1:47 PM  Result Value Ref Range   Sodium 137 135 - 145 mmol/L   Potassium 3.4 (L) 3.5 - 5.1 mmol/L   Chloride 101 98 - 111 mmol/L   CO2 27 22 - 32 mmol/L   Glucose, Bld 217 (H) 70 - 99 mg/dL   BUN 41 (H) 8 - 23 mg/dL   Creatinine, Ser 1.13 (H) 0.44 - 1.00 mg/dL   Calcium 8.5 (L) 8.9 - 10.3 mg/dL   Total Protein 5.8 (L) 6.5 - 8.1 g/dL   Albumin 2.6 (L) 3.5 - 5.0 g/dL   AST 18 15 - 41 U/L   ALT 16 0 - 44 U/L   Alkaline Phosphatase 72 38 - 126 U/L   Total Bilirubin 0.6 0.3 - 1.2 mg/dL  GFR calc non Af Amer 45 (L) >60 mL/min   GFR calc Af Amer 52 (L) >60 mL/min   Anion gap 9 5 - 15    Comment: Performed at [facility], [place] [address], [place], [place] [address]  Blood gas, venous     Status: Abnormal   Collection Time: 07/07/18  2:17 PM  Result Value Ref Range   pH, Ven 7.426 7.250 - 7.430   pCO2, Ven 44.1 44.0 - 60.0 mmHg   pO2, Ven below reportable range 32.0 - 45.0 mmHg    Comment: CRITICAL RESULT CALLED TO, READ BACK BY AND VERIFIED WITH: A.POWELL, MD AT 1420 BY M.JESTER, RRT, RCP ON 07/07/2018    Bicarbonate 28.4 (H) 20.0 - 28.0 mmol/L   Acid-Base Excess 4.0 (H) 0.0 - 2.0 mmol/L   O2 Saturation 37.6 %   Patient temperature 98.6    Collection site VENOUS    Drawn by COLLECTED BY LABORATORY    Sample type VENOUS     Comment: Performed at [facility], [place] [address] [place], [place] [address]  TSH     Status: None   Collection Time: 07/07/18  2:23 PM  Result Value Ref Range   TSH 2.695 0.350 - 4.500 uIU/mL    Comment: Performed by a 3rd Generation assay with a functional sensitivity of <=0.01 uIU/mL. Performed at [facility], [place] [address], [place], [place] [address]   Ammonia     Status: None   Collection Time: 07/07/18  2:23 PM  Result Value Ref Range   Ammonia 13 9 - 35 umol/L    Comment: Performed at [facility], [place] [address] [place],  [address]    No  results found.             Blood pressure (!) 128/59, pulse 86, temperature 98 F (36.7 C), temperature source Oral, resp. rate 16, height 5\' 2"  (1.575 m), weight 98 kg, SpO2 93 %.  Physical exam:   General--white female in no acute distress she does seem to be somewhat confused and somnolent. ENT--extraocular movements grossly intact sclera nonicteric Neck--no gross masses Heart--somewhat tachycardic Lungs--clear Abdomen--obese but nondistended nontender Psych--patient very somnolent but is responsive   Assessment: 1.  GI bleed.  She did apparently have some bright red blood with bowel movement down in [place] but the nurse here at [facility] long stated that she had a brown bowel movement without any obvious bright blood after arriving here.  She has had several episodes of hematemesis.  She apparently has been on Zantac and most recently pantoprazole.  It may well be that she has an ulcer. 2.  Elevated WBC may well be due to her urinary tract infection which is been documented her abdomen is reasonably benign at this point but we do need to rule out intra-abdominal infectious process such as diverticulitis perforation etc. 3.  Chronic medical problems as noted above  Plan: We will follow with you.  At this point I would give her empirically PPI and go ahead with a CT scan.  When she is more stable it would be reasonable to go ahead with EGD.  Have discussed all this with daughter.   Nancy Fetter 07/07/2018, 3:36 PM   This note was created using voice recognition software and minor errors may Have occurred unintentionally. Pager: [phone] If no answer or after hours call [phone]

## 2018-07-07 NOTE — H&P (Signed)
History and Physical    Danielle Dennis OIZ:[id number] DOB: [birth year] DOA: 07/07/2018  PCP: Danielle Dennis, Danielle Mt, MD  Patient coming from: [facility]  I have personally briefly reviewed patient's old medical records in [place]  Chief Complaint: GI bleed  HPI: Danielle Dennis is Danielle Dennis 85 y.o. female with medical history significant of HFpEF, COPD, T2DM, HTN, Hypothyroidism, ESBL e coli bacteremia and multiple other medical problems who presented to [place] [facility] initially with concern for the "foley not draining right all day" on 07/03/18.  Pt also noted generalized weakness and difficulty ambulating.  Urine cx done on   Patient is an 85 year old Caucasian female from local assisted living with past medical history of hypertension, type 2 diabetes mellitus, COPD, hyponatremia hypothyroidism, diastolic heart failure who was sent to the emergency department on 07/03/2018 due to "Foley not draining right all day".  She also complained of generalized weakness with difficulty to ambulate.   ED labs showed leukocytosis, hyponatremia, hypokalemia, AKI, and hyperglycemia.  Urine cx from 06/29/2018 showed ESBL E. Coli and pan sensitive E faecium.  Pt follows with Dr. Emi Dennis (urology, who had started pt on ceftin and then subsequently changed to augmentin).  Initially plan was for observation and d/c to SNF on 2/13, but she had severe hypokalemia to 2.1 and acute kidney injury.  On 2/14, plan for d/c to SNF, but pt had large bowel movement with blood.  Pt was monitored another night and did not have any additional episdoes for blood in stool or black stool, but then started having episodes of coffee-ground low volume emesis.  No known hx of GI bleed.  Daughter discussed with OSH provider and desired endoscopic observation.  Pt was transferred to [facility] as no GI coverage at [place].    On my evaluation, pt is sleepy and unable to give much history.  She describes some abdominal pain.  But isn't  able to clearly say why she's here at [facility].  Her daughter notes she was doing well about 7 days ago.  She notes that she was treated for ESBL e coli bacteremia and the PICC was removed several days ago.  Sometime after that about 7 days ago, she became sleepy like she is now.  Less able to move around, ambulate.  Review of Systems: unable to perform due to patient's mental status  Past Medical History:  Diagnosis Date  . CHF (congestive heart failure) ([place])   . COPD (chronic obstructive pulmonary disease) ([place])   . Diabetes mellitus without complication ([place])   . GERD (gastroesophageal reflux disease)   . Hypertension   . Hypothyroidism   . Presence of permanent cardiac pacemaker     History reviewed. No pertinent surgical history.   reports that she has never smoked. She has never used smokeless tobacco. She reports previous alcohol use. She reports previous drug use.  Allergies  Allergen Reactions  . Codeine Other (See Comments)    Per MAR    History reviewed. No pertinent family history.  Prior to Admission medications   Not on File  Per OSH d/c sum Cyanocobalamin (Vitamin B-12) [Vitamin B-12] 1,000 mcg PO 0900    Aspirin [Aspirin, Chewable] 81 mg PO DAILY    Estradiol [Estrace] 1 mg PO DAILY    Loperamide HCl [Imodium Danielle Dennis] 2 tab PO AS DIR PRN      PRN Reason: Diarrhea    Ranitidine [Zantac] 150 mg PO BID    Vit C/E/Zn/Coppr/Lutein/Zeaxan [Preservision Areds 2 Softgel] 1 cap  PO 0800    HydrALAZINE (Cardiovascular) [Apresoline] 25 mg PO TID #90 tab    Triamcinolone Acetonide 1 app TOP DAILY PRN      PRN Reason: Rash    Levothyroxine [Synthroid, Levoxyl] 125 mcg PO DAILY    Sitagliptin Phosphate [Januvia] 50 mg PO DAILY    Benzonatate 200 mg PO BID PRN      PRN Reason: Cough    Acetaminophen [Tylenol] 650 mg PO Q6H PRN      PRN Reason: Pain    Potassium Chloride 40 meq PO BID    Pantoprazole Sodium [Protonix] 40 mg PO BID    Metolazone 2.5 mg PO .TUEFRI    Bumetanide 4  mg PO QHS    Bumetanide 6 mg PO QAM    Amlodipine [Norvasc] 5 mg PO DAILY    Fluticasone/Vilanterol [Breo Ellipta 100-25 Mcg INH] 1 puff INH DAILY    Mag Hydrox/Aluminum Hyd/Simeth [Antacid M Liquid] 30 ml PO Q4H PRN      PRN Reason: Acid Reflux    Sennosides [Senna Lax] 17.2 mg PO HS    Insulin Regular, Human [Novolin R] 0 units SQ .TIDWM SS    Multivit-Min/FA/Lycopen/Lutein [Centrum Silver Tablet] 1 tab PO DAILY    Amoxicillin/Clavulanate Pot[place]. [Augmentin] 875 mg PO BID    Propylene Glycol/Peg 400 [Systane Gel Eye Drops] 1 drops OU TID    Pregabalin [Lyrica] 75 mg PO 0900,2100 #10  Physical Exam: Vitals:   07/07/18 1240 07/07/18 1442  BP: (!) 128/59   Pulse: 86   Resp: 16   Temp: 98 F (36.7 C)   TempSrc: Oral   SpO2: 93%   Weight:  98 kg  Height:  5\' 2"  (1.575 m)    Constitutional: NAD, calm, comfortable, sleepy Vitals:   07/07/18 1240 07/07/18 1442  BP: (!) 128/59   Pulse: 86   Resp: 16   Temp: 98 F (36.7 C)   TempSrc: Oral   SpO2: 93%   Weight:  98 kg  Height:  5\' 2"  (1.575 m)   Eyes: PERRL, lids and conjunctivae normal ENMT: Mucous membranes are moist. Posterior pharynx clear of any exudate or lesions.Normal dentition.  Neck: normal, supple, no masses, no thyromegaly Respiratory: clear to auscultation bilaterally, no wheezing, no crackles.  Cardiovascular: Regular rate and rhythm, no murmurs / rubs / gallops. Bilateral le edema Abdomen: no tenderness, no masses palpated. No hepatosplenomegaly. Bowel sounds positive.  Musculoskeletal: no clubbing / cyanosis. No joint deformity upper and lower extremities. Good ROM, no contractures. Normal muscle tone.  Skin: no rashes, lesions, ulcers. No induration Neurologic: CN 2-12 grossly intact. Sensation intact.  Drowsy. Psychiatric: Normal judgment and insight. Alert and oriented x 2. Normal mood.   Labs on Admission: I have personally reviewed following labs and imaging studies  CBC: Recent Labs  Lab  07/07/18 1347  WBC 16.1*  HGB 10.5*  HCT 33.7*  MCV 88.7  PLT 539   Basic Metabolic Panel: Recent Labs  Lab 07/07/18 1347  NA 137  K 3.4*  CL 101  CO2 27  GLUCOSE 217*  BUN 41*  CREATININE 1.13*  CALCIUM 8.5*   GFR: Estimated Creatinine Clearance: 42 mL/min (Marda Breidenbach) (by C-G formula based on SCr of 1.13 mg/dL (H)). Liver Function Tests: Recent Labs  Lab 07/07/18 1347  AST 18  ALT 16  ALKPHOS 72  BILITOT 0.6  PROT 5.8*  ALBUMIN 2.6*   No results for input(s): LIPASE, AMYLASE in the last 168 hours. Recent Labs  Lab 07/07/18  1423  AMMONIA 13   Coagulation Profile: No results for input(s): INR, PROTIME in the last 168 hours. Cardiac Enzymes: No results for input(s): CKTOTAL, CKMB, CKMBINDEX, TROPONINI in the last 168 hours. BNP (last 3 results) No results for input(s): PROBNP in the last 8760 hours. HbA1C: No results for input(s): HGBA1C in the last 72 hours. CBG: Recent Labs  Lab 07/07/18 1633  GLUCAP 217*   Lipid Profile: No results for input(s): CHOL, HDL, LDLCALC, TRIG, CHOLHDL, LDLDIRECT in the last 72 hours. Thyroid Function Tests: Recent Labs    07/07/18 1423  TSH 2.695   Anemia Panel: Recent Labs    07/07/18 1423  VITAMINB12 1,441*  FOLATE 8.8   Urine analysis:    Component Value Date/Time   COLORURINE YELLOW 07/07/2018 1448   APPEARANCEUR CLOUDY (Lovene Maret) 07/07/2018 1448   LABSPEC 1.012 07/07/2018 1448   PHURINE 5.0 07/07/2018 1448   GLUCOSEU NEGATIVE 07/07/2018 1448   HGBUR SMALL (Yee Gangi) 07/07/2018 1448   BILIRUBINUR NEGATIVE 07/07/2018 1448   KETONESUR NEGATIVE 07/07/2018 1448   PROTEINUR NEGATIVE 07/07/2018 1448   NITRITE NEGATIVE 07/07/2018 1448   LEUKOCYTESUR LARGE (Drey Shaff) 07/07/2018 1448    Radiological Exams on Admission: No results found.  EKG: Independently reviewed. none  Assessment/Plan Active Problems:   GI bleed   ESBL (extended spectrum beta-lactamase) producing bacteria infection   (HFpEF) heart failure with preserved  ejection fraction (HCC)   T2DM (type 2 diabetes mellitus) ([place])  GI bleed: several episodesof low volume coffee ground emesis and 1 episode of BRBPR in the past 3 days per OSH records. PPI BID Clear liquid diet for now, will make NPO at [facility] c/s, appreciate recs 2 PIV  Urinary Tract Infection  Indwelling Foley Catheter:  Of note, she recently had ESBL bacteremia in January, treated with 7 days ertapenem.   Urine cx on 2/8 and on 2/11 positive for ESBL e. Coli and enterococcus faecium Augmentin per Dr. Emi Dennis (urology) at OSH.  Started on ertapenem at presentation at OSH, but transitioned back to augmentin. Will restart carbapenem given ESBL Repeat UA/urine cx/blood cultures Not quite clear to me why she has indwelling foley catheter, on discussion with daughter, this sounds like it is relatively new.  Would consider trial of void soon.  Abdominal Discomfort: possibly related to above, follow CT abdomen, pelvis.  She had abnormal CT scan at OSH with hydroneprhosis and hydroureter on the right as well as findings concerning for possible subtle colitis.  Follow repeat CT scan with contrast Would discuss with urology in AM    Acute Kidney Injury:  Baseline creatinine 0.8-1 per OSH records, at presentation to [facility] was 2.1 Improved She's on bumex/metalazone, currently being held - will need close follow up when resumed  Hypokalemia: potassium of 2.1 on 2/13, likely related to [place] and bumex.  Will need supplements on discharge.  Suspect related to diuretics, especially metolazone.  Hyponatremia: improved here.  Noted at OSH.  chronic per OSH records, thiazide diuretic likely was contributing to this, would follow labs closely once this is resumed.  Acute Encephalopathy: Cash Duce&ox2.  Sleepy on exam.  Daughter notes she's been like this for past week.   VBG, b12, folate, ammonia, TSH, and blood cultures. Treat infection as noted above  Generalized Weakness and  Deconditioning: PT/OT Dietician  Chronic Hypoxic Resp Failure  COPD: continue O2 (not clear to me based on OSH records or discussion with pt how much she uses chronically) Breo ellipta at home, use budesonide/brovana here  Type 2  DM: SSI.  Holding [place].  Hydronephrosis: CT urogram on 06/29/2018 showed mild to moderate R hydroureteronehrosis which terminates at the junction of middle and distal thirds of the R ureter.  No urinary tract calculi.  The possibility of Evanthia Maund ureteral stricture or urotheial lesion in the ureter should be considered.  Urologic consultation is recommended.  These findings could be better evaluated with follow up hematuria protocal CT scan if clinically appropriate. Repeat CT scan pending Would follow up with urology in AM  Hypertension: holding amlodipine, hydralazine for now  Hypothyroidism: synthroid  GERD: PPI as noted above  Chronic Diastolic HF: She does have bilateral LE edema.  holding diuretics for now prior to CT study with contrast, consider resumption tomorrow  Hormone Replacement Therapy: currently holding estradiol.  Consider discontinuing given risk with continued use at her age.  Holding lyrica given AMS above  DVT prophylaxis: SCD  Code Status: DNR  Family Communication: daughter  Disposition Plan: pending  Consults called: GI  Admission status: inpatient    Fayrene Helper MD [facility] Pager AMION  If 7PM-7AM, please contact night-coverage [email or web address] Password [facility]  07/07/2018, 6:11 PM

## 2018-07-07 NOTE — Plan of Care (Signed)
  Problem: Clinical Measurements: Goal: Ability to maintain clinical measurements within normal limits will improve Outcome: Progressing   Problem: Elimination: Goal: Will not experience complications related to bowel motility Outcome: Progressing   Problem: Pain Managment: Goal: General experience of comfort will improve Outcome: Progressing   Problem: Safety: Goal: Ability to remain free from injury will improve Outcome: Progressing   Problem: Skin Integrity: Goal: Risk for impaired skin integrity will decrease Outcome: Progressing   Problem: Bowel/Gastric: Goal: Will show no signs and symptoms of gastrointestinal bleeding Outcome: Progressing   Problem: Fluid Volume: Goal: Will show no signs and symptoms of excessive bleeding Outcome: Progressing   Problem: Clinical Measurements: Goal: Complications related to the disease process, condition or treatment will be avoided or minimized Outcome: Progressing

## 2018-07-07 NOTE — Progress Notes (Signed)
Pt had large incontinent BM. BM brown in color, however there was a red halo on bed pad around stool. Dr. Oletta Lamas paged and made aware. Will continue to monitor.

## 2018-07-08 DIAGNOSIS — Z1612 Extended spectrum beta lactamase (ESBL) resistance: Secondary | ICD-10-CM

## 2018-07-08 DIAGNOSIS — B9629 Other Escherichia coli [E. coli] as the cause of diseases classified elsewhere: Secondary | ICD-10-CM

## 2018-07-08 DIAGNOSIS — N390 Urinary tract infection, site not specified: Secondary | ICD-10-CM

## 2018-07-08 LAB — COMPREHENSIVE METABOLIC PANEL
ALT: 16 U/L (ref 0–44)
AST: 17 U/L (ref 15–41)
Albumin: 2.4 g/dL — ABNORMAL LOW (ref 3.5–5.0)
Alkaline Phosphatase: 67 U/L (ref 38–126)
Anion gap: 9 (ref 5–15)
BILIRUBIN TOTAL: 0.5 mg/dL (ref 0.3–1.2)
BUN: 27 mg/dL — AB (ref 8–23)
CO2: 23 mmol/L (ref 22–32)
Calcium: 8.5 mg/dL — ABNORMAL LOW (ref 8.9–10.3)
Chloride: 103 mmol/L (ref 98–111)
Creatinine, Ser: 1 mg/dL (ref 0.44–1.00)
GFR calc Af Amer: >60 mL/min (ref >60)
GFR calc non Af Amer: 52 mL/min — ABNORMAL LOW (ref >60)
Glucose, Bld: 258 mg/dL — ABNORMAL HIGH (ref 70–99)
Potassium: 3.4 mmol/L — ABNORMAL LOW (ref 3.5–5.1)
Sodium: 135 mmol/L (ref 135–145)
Total Protein: 5.5 g/dL — ABNORMAL LOW (ref 6.5–8.1)

## 2018-07-08 LAB — CBC
HCT: 30.5 % — ABNORMAL LOW (ref 36.0–46.0)
Hemoglobin: 9.4 g/dL — ABNORMAL LOW (ref 12.0–15.0)
MCH: 27.7 pg (ref 26.0–34.0)
MCHC: 30.8 g/dL (ref 30.0–36.0)
MCV: 90 fL (ref 80.0–100.0)
Platelets: 258 10*3/uL (ref 150–400)
RBC: 3.39 MIL/uL — ABNORMAL LOW (ref 3.87–5.11)
RDW: 15.9 % — AB (ref 11.5–15.5)
WBC: 14.5 10*3/uL — ABNORMAL HIGH (ref 4.0–10.5)
nRBC: 0 % (ref 0.0–0.2)

## 2018-07-08 LAB — HEMATOCRIT
HCT: 33.2 % — ABNORMAL LOW (ref 36.0–46.0)
HCT: 30.1 % — ABNORMAL LOW (ref 36.0–46.0)

## 2018-07-08 LAB — GLUCOSE, CAPILLARY
Glucose-Capillary: 168 mg/dL — ABNORMAL HIGH (ref 70–99)
Glucose-Capillary: 186 mg/dL — ABNORMAL HIGH (ref 70–99)
Glucose-Capillary: 184 mg/dL — ABNORMAL HIGH (ref 70–99)
Glucose-Capillary: 315 mg/dL — ABNORMAL HIGH (ref 70–99)

## 2018-07-08 LAB — HEMOGLOBIN
Hemoglobin: 10.2 g/dL — ABNORMAL LOW (ref 12.0–15.0)
Hemoglobin: 9.3 g/dL — ABNORMAL LOW (ref 12.0–15.0)

## 2018-07-08 MED ORDER — DOCUSATE SODIUM 100 MG PO CAPS
100.0000 mg | ORAL_CAPSULE | Freq: Every day | ORAL | Status: DC
  Administered 2018-07-08 – 2018-07-12 (×5): 100 mg via ORAL
  Filled 2018-07-08 (×5): qty 1

## 2018-07-08 MED ORDER — POTASSIUM CHLORIDE CRYS ER 20 MEQ PO TBCR
40.0000 meq | EXTENDED_RELEASE_TABLET | Freq: Once | ORAL | Status: AC
  Administered 2018-07-08: 40 meq via ORAL
  Filled 2018-07-08: qty 2

## 2018-07-08 NOTE — Progress Notes (Signed)
Initial Nutrition Assessment  DOCUMENTATION CODES:   Obesity unspecified  INTERVENTION:    Boost Breeze po TID, each supplement provides 250 kcal and 9 grams of protein  Ensure Enlive po BID, each supplement provides 350 kcal and 20 grams of protein once diet advanced   NUTRITION DIAGNOSIS:   Inadequate oral intake related to lethargy/confusion as evidenced by meal completion < 25%.  GOAL:   Patient will meet greater than or equal to 90% of their needs  MONITOR:   PO intake, Supplement acceptance, Weight trends, Labs, Diet advancement, I & O's  REASON FOR ASSESSMENT:   Consult Assessment of nutrition requirement/status  ASSESSMENT:   Patient with PMH significant for CHF, COPD, DM, HTN, Hypothyroidism, and ESBL e coli bacteremia. Presents this admission with GI bleed and UTI.    Pt denies having a loss in appetite PTA. States she tries to eat three meals. When asked what types of food options she consumes, pt unable to elaborate. She is currently on CLD. RD observed pt drinking Boost Breeze. She would like to continue with these.   Pt endorses a UBW 210 lb and is unsure if she has lost weight. Records indicate pt weighed 233 lb in 12/2016 but do not provide any recent wt history. Nutrition-Focused physical exam completed.   Medications reviewed and include: colace, SS novolog Labs reviewed: K 3.4 (L) CBG 129-217  NUTRITION - FOCUSED PHYSICAL EXAM:    Most Recent Value  Orbital Region  No depletion  Upper Arm Region  No depletion  Thoracic and Lumbar Region  Unable to assess  Buccal Region  No depletion  Temple Region  Mild depletion  Clavicle Bone Region  Mild depletion  Clavicle and Acromion Bone Region  Mild depletion  Scapular Bone Region  Unable to assess  Dorsal Hand  No depletion  Patellar Region  No depletion  Anterior Thigh Region  No depletion  Posterior Calf Region  No depletion  Edema (RD Assessment)  Mild  Hair  Reviewed  Eyes  Reviewed  Mouth   Reviewed  Skin  Reviewed  Nails  Reviewed     Diet Order:   Diet Order            Diet clear liquid Room service appropriate? Yes; Fluid consistency: Thin  Diet effective now              EDUCATION NEEDS:   Not appropriate for education at this time  Skin:  Skin Assessment: Skin Integrity Issues: Skin Integrity Issues:: Other (Comment) Other: MASD- butt, groin, breast  Last BM:  2/16  Height:   Ht Readings from Last 1 Encounters:  07/07/18 5\' 2"  (1.575 m)    Weight:   Wt Readings from Last 1 Encounters:  07/08/18 98.2 kg    Ideal Body Weight:  50 kg  BMI:  Body mass index is 39.6 kg/m.  Estimated Nutritional Needs:   Kcal:  1500-1700 kcal  Protein:  75-90 grams  Fluid:  >/= 1.5 L/day  Mariana Single RD, LDN Clinical Nutrition Pager # - [phone]

## 2018-07-08 NOTE — Progress Notes (Signed)
PROGRESS NOTE    Danielle Dennis  RCV:[id number] DOB: [birth year] DOA: 07/07/2018 PCP: Emmaline Kluver, MD    Brief Narrative:  85 y.o. female with medical history significant of HFpEF, COPD, T2DM, HTN, Hypothyroidism, ESBL e coli bacteremia and multiple other medical problems who presented to [place] hospital initially with concern for the "foley not draining right all day" on 07/03/18.  Pt also noted generalized weakness and difficulty ambulating.  Urine cx done on   Patient is an 85 year old Caucasian female from local assisted living with past medical history of hypertension, type 2 diabetes mellitus, COPD, hyponatremia hypothyroidism, diastolic heart failure who was sent to the emergency department on 07/03/2018 due to "Foley not draining right all day".  She also complained of generalized weakness with difficulty to ambulate.   ED labs showed leukocytosis, hyponatremia, hypokalemia, AKI, and hyperglycemia.  Urine cx from 06/29/2018 showed ESBL E. Coli and pan sensitive E faecium.  Pt follows with Dr. Emi Holes (urology, who had started pt on ceftin and then subsequently changed to augmentin).  Initially plan was for observation and d/c to SNF on 2/13, but she had severe hypokalemia to 2.1 and acute kidney injury.  On 2/14, plan for d/c to SNF, but pt had large bowel movement with blood.  Pt was monitored another night and did not have any additional episdoes for blood in stool or black stool, but then started having episodes of coffee-ground low volume emesis.  No known hx of GI bleed.  Daughter discussed with OSH provider and desired endoscopic observation.  Pt was transferred to [facility] as no GI coverage at [place].    On my evaluation, pt is sleepy and unable to give much history.  She describes some abdominal pain.  But isn't able to clearly say why she's here at [facility].  Her daughter notes she was doing well about 7 days ago.  She notes that she was treated for ESBL e coli bacteremia and  the PICC was removed several days ago.  Sometime after that about 7 days ago, she became sleepy like she is now.  Less able to move around, ambulate.   Assessment & Plan:   Principal Problem:   GI bleed Active Problems:   UTI due to extended-spectrum beta lactamase (ESBL) producing Escherichia coli   (HFpEF) heart failure with preserved ejection fraction (HCC)   T2DM (type 2 diabetes mellitus) (HCC)  GI bleed:  -several episodesof low volume coffee ground emesis and 1 episode of BRBPR in the past 3 days per OSH records. -PPI BID -GI consulted. Recommendation for possible endoscopy in next day or 2  Urinary Tract Infection  Indwelling Foley Catheter:   -Of note, she recently had ESBL bacteremia in January, treated with 7 days ertapenem.   -Urine cx on 2/8 and on 2/11 positive for ESBL e. Coli and enterococcus faecium -Augmentin per Dr. Emi Holes (urology) at OSH.  Started on ertapenem at presentation at OSH, but transitioned back to augmentin. -currently on carbapenem given ESBL -Follow repeat UA/urine cx/blood cultures. If neg, then consider d/c further abx  Abdominal Discomfort:  -large stool on CT abd -Required manual disimpaction. Continue cathartics as tolerated   Acute Kidney Injury:  -Baseline creatinine 0.8-1 per OSH records, at presentation to [facility] was 2.1 -Improved -She's on bumex/metalazone, currently being held - will need close follow up when resumed  Hypokalemia:  -potassium of 2.1 on 2/13, likely related to metalozone and bumex.   -Continue to replace as tolerated  Hyponatremia: -improved here.   -  Noted at OSH.  chronic per OSH records, thiazide diuretic likely was contributing to this, would follow labs closely once this is resumed. -Repeat bmet in AM  Acute Encephalopathy:  -a&ox2.   -Appears drowsy this AM  Generalized Weakness and Deconditioning: PT/OT Dietician  Chronic Hypoxic Resp Failure  COPD: continue O2 (not clear to me based on  OSH records or discussion with pt how much she uses chronically) Breo ellipta at home, use budesonide/brovana here  Type 2 DM: SSI.  Holding [place].  Hydronephrosis: CT urogram on 06/29/2018 showed mild to moderate R hydroureteronehrosis which terminates at the junction of middle and distal thirds of the R ureter.  No urinary tract calculi.  The possibility of a ureteral stricture or urotheial lesion in the ureter should be considered.  Urologic consultation is recommended.  These findings could be better evaluated with follow up hematuria protocal CT scan if clinically appropriate. Repeat CT scan pending Would follow up with urology in AM  Hypertension: holding amlodipine, hydralazine for now  Hypothyroidism: synthroid  GERD: PPI as noted above  Chronic Diastolic HF: She does have bilateral LE edema.  holding diuretics for now prior to CT study with contrast, consider resumption tomorrow  Hormone Replacement Therapy: currently holding estradiol.  Consider discontinuing given risk with continued use at her age.   DVT prophylaxis: SCD's Code Status: DNR Family Communication: Pt in room, family not at bedside Disposition Plan: Uncertain at this time  Consultants:   GI  Procedures:     Antimicrobials: Anti-infectives (From admission, onward)   Start     Dose/Rate Route Frequency Ordered Stop   07/07/18 1600  ertapenem (INVANZ) 1,000 mg in sodium chloride 0.9 % 100 mL IVPB     1 g 200 mL/hr over 30 Minutes Intravenous Every 24 hours 07/07/18 1431         Subjective: Complaining of abd pain  Objective: Vitals:   07/08/18 0500 07/08/18 0523 07/08/18 0935 07/08/18 1345  BP:  (!) 141/49  (!) 137/55  Pulse:  87  79  Resp:  20  18  Temp:  98 F (36.7 C)  98.7 F (37.1 C)  TempSrc:  Oral  Oral  SpO2:  92% 94% 97%  Weight: 98.2 kg     Height:        Intake/Output Summary (Last 24 hours) at 07/08/2018 1559 Last data filed at 07/08/2018 0800 Gross per 24 hour    Intake 1569.33 ml  Output 1475 ml  Net 94.33 ml   Filed Weights   07/07/18 1442 07/08/18 0500  Weight: 98 kg 98.2 kg    Examination:  General exam: Appears calm and comfortable  Respiratory system: Clear to auscultation. Respiratory effort normal. Cardiovascular system: S1 & S2 heard, RRR Gastrointestinal system: Abdomen is distended, decreased BS Central nervous system: Alert and oriented. No focal neurological deficits. Extremities: Symmetric 5 x 5 power. Skin: No rashes, lesions  Psychiatry: Judgement and insight appear normal. Mood & affect appropriate.   Data Reviewed: I have personally reviewed following labs and imaging studies  CBC: Recent Labs  Lab 07/07/18 1347 07/07/18 2133 07/08/18 0448 07/08/18 1307  WBC 16.1*  --  14.5*  --   HGB 10.5* 10.2* 9.4* 10.2*  HCT 33.7* 33.1* 30.5* 33.2*  MCV 88.7  --  90.0  --   PLT 267  --  258  --    Basic Metabolic Panel: Recent Labs  Lab 07/07/18 1347 07/08/18 0448  NA 137 135  K 3.4* 3.4*  CL 101 103  CO2 27 23  GLUCOSE 217* 258*  BUN 41* 27*  CREATININE 1.13* 1.00  CALCIUM 8.5* 8.5*   GFR: Estimated Creatinine Clearance: 47.5 mL/min (by C-G formula based on SCr of 1 mg/dL). Liver Function Tests: Recent Labs  Lab 07/07/18 1347 07/08/18 0448  AST 18 17  ALT 16 16  ALKPHOS 72 67  BILITOT 0.6 0.5  PROT 5.8* 5.5*  ALBUMIN 2.6* 2.4*   No results for input(s): LIPASE, AMYLASE in the last 168 hours. Recent Labs  Lab 07/07/18 1423  AMMONIA 13   Coagulation Profile: No results for input(s): INR, PROTIME in the last 168 hours. Cardiac Enzymes: No results for input(s): CKTOTAL, CKMB, CKMBINDEX, TROPONINI in the last 168 hours. BNP (last 3 results) No results for input(s): PROBNP in the last 8760 hours. HbA1C: No results for input(s): HGBA1C in the last 72 hours. CBG: Recent Labs  Lab 07/07/18 1633 07/07/18 2017 07/08/18 0733 07/08/18 1136  GLUCAP 217* 129* 168* 186*   Lipid Profile: No results  for input(s): CHOL, HDL, LDLCALC, TRIG, CHOLHDL, LDLDIRECT in the last 72 hours. Thyroid Function Tests: Recent Labs    07/07/18 1423  TSH 2.695   Anemia Panel: Recent Labs    07/07/18 1423  VITAMINB12 1,441*  FOLATE 8.8   Sepsis Labs: No results for input(s): PROCALCITON, LATICACIDVEN in the last 168 hours.  Recent Results (from the past 240 hour(s))  Culture, blood (routine x 2)     Status: None (Preliminary result)   Collection Time: 07/07/18  3:40 PM  Result Value Ref Range Status   Specimen Description   Final    RIGHT ANTECUBITAL Performed at [facility], [place] [address], [place], [place] [address]    Special Requests   Final    BOTTLES DRAWN AEROBIC AND ANAEROBIC Blood Culture adequate volume Performed at [facility] [address], [place], [place] [address]    Culture   Final    NO GROWTH < 24 HOURS Performed at [facility] [address] [place], [place] [address]    Report Status PENDING  Incomplete  Culture, blood (routine x 2)     Status: None (Preliminary result)   Collection Time: 07/07/18  3:50 PM  Result Value Ref Range Status   Specimen Description   Final    BLOOD RIGHT ARM Performed at [facility] [address] [place], [place] [address]    Special Requests   Final    BOTTLES DRAWN AEROBIC ONLY Blood Culture adequate volume Performed at [facility] [address], [place],  [address]    Culture   Final    NO GROWTH < 24 HOURS Performed at [facility] [address], [place],  [address]    Report Status PENDING  Incomplete     Radiology Studies: Ct Abdomen Pelvis Wo Contrast  Result Date: 07/07/2018 CLINICAL DATA:  Brownish bowel movement with vomiting and cough a ground emesis. Leukocytosis to 16.1. Assess for gastrointestinal bleed. EXAM: CT ABDOMEN AND PELVIS WITHOUT CONTRAST TECHNIQUE: Multidetector CT imaging of the abdomen  and pelvis was performed following the standard protocol without IV contrast. COMPARISON:  07/06/2010 FINDINGS: Lower chest: New pulmonary consolidations in both lower lobes with air bronchograms consistent with interval development of bilateral pneumonia and atelectasis. Trace left effusion. Top-normal heart size with out pericardial effusion or thickening. Right atrial and ventricular leads are present. Hepatobiliary: The gallbladder is surgically absent. The  unenhanced liver is unremarkable. Pancreas: Atrophic pancreas without mass or ductal dilatation. No inflammation. Spleen: Normal Adrenals/Urinary Tract: Normal bilateral adrenal glands. The left kidney is unremarkable. Moderate marked right-sided hydroureteronephrosis and hydroureter is again visualized extending to the pelvic brim where there is a transition in size and caliber of the ureter question stricture. No calculus is identified. No apparent intraluminal mass. Stomach is decompressed by Foley catheter. Stomach/Bowel: Large amount of retained stool is seen within the descending colon through rectosigmoid with transmural thickening suggestive fecal impaction and possible stercoral colitis. Small hiatal hernia. Contrast filled physiologic distention of the stomach. The duodenal sweep and ligament of Treitz are normal. No small bowel obstruction. The appendix is not confidently identified. Vascular/Lymphatic: Aortoiliac atherosclerosis. No aneurysm. No adenopathy. Scattered pelvic phleboliths are noted. Reproductive: Status post hysterectomy. No adnexal masses. Other: No free air nor free fluid. No hernia. Musculoskeletal: Degenerative disc disease and scoliosis of the lumbar spine. No aggressive osseous lesions. IMPRESSION: 1. New bilateral lower lobe pulmonary consolidations with air bronchograms consistent with interval development of bilateral pneumonia and atelectasis. Trace left effusion. 2. Moderate to marked right-sided hydroureteronephrosis and  hydroureter extending nor definite ureteral mass. No significant change from prior. Is identified. 3. Large amount of retained stool within the descending colon through rectosigmoid with slight transmural thickening suggestive of fecal impaction and possible stercoral colitis. 4. Aortoiliac atherosclerosis. Electronically Signed   By: Ashley Royalty M.D.   On: 07/07/2018 20:14    Scheduled Meds: . arformoterol  15 mcg Nebulization BID  . budesonide (PULMICORT) nebulizer solution  0.25 mg Nebulization BID  . docusate sodium  100 mg Oral Daily  . feeding supplement  1 Container Oral TID BM  . insulin aspart  0-5 Units Subcutaneous QHS  . insulin aspart  0-9 Units Subcutaneous TID WC  . levothyroxine  125 mcg Oral Q0600  . mouth rinse  15 mL Mouth Rinse BID  . nystatin   Topical TID  . pantoprazole (PROTONIX) IV  40 mg Intravenous Q12H  . polyvinyl alcohol  1 drop Both Eyes QHS   Continuous Infusions: . ertapenem Stopped (07/07/18 1559)     LOS: 1 day   Marylu Lund, MD [facility] Pager On Amion  If 7PM-7AM, please contact night-coverage 07/08/2018, 3:59 PM

## 2018-07-08 NOTE — Plan of Care (Signed)
  Problem: Clinical Measurements: Goal: Ability to maintain clinical measurements within normal limits will improve Outcome: Progressing   Problem: Nutrition: Goal: Adequate nutrition will be maintained Outcome: Progressing   Problem: Elimination: Goal: Will not experience complications related to bowel motility Outcome: Progressing   Problem: Pain Managment: Goal: General experience of comfort will improve Outcome: Progressing   Problem: Safety: Goal: Ability to remain free from injury will improve Outcome: Progressing   Problem: Skin Integrity: Goal: Risk for impaired skin integrity will decrease Outcome: Progressing   Problem: Fluid Volume: Goal: Will show no signs and symptoms of excessive bleeding Outcome: Progressing   Problem: Clinical Measurements: Goal: Complications related to the disease process, condition or treatment will be avoided or minimized Outcome: Progressing

## 2018-07-08 NOTE — Progress Notes (Signed)
[facility] Progress Note  Subjective: The patient was sleeping when I went in to the room but was easily arousable and able to communicate. I asked her how she felt she stated a little rough, but has not vomited today. She has a little epigastric discomfort.  Objective: Vital signs in last 24 hours: Temp:  [98 F (36.7 C)-98.8 F (37.1 C)] 98 F (36.7 C) (02/17 0523) Pulse Rate:  [78-87] 87 (02/17 0523) Resp:  [16-20] 20 (02/17 0523) BP: (113-141)/(49-59) 141/49 (02/17 0523) SpO2:  [90 %-94 %] 94 % (02/17 0935) Weight:  [98 kg-98.2 kg] 98.2 kg (02/17 0500) Weight change:    PE:  No acute distress  Heart regular rhythm  Abdomen soft with some epigastric discomfort  Lab Results: Results for orders placed or performed during the hospital encounter of 07/07/18 (from the past 24 hour(s))  CBC     Status: Abnormal   Collection Time: 07/07/18  1:47 PM  Result Value Ref Range   WBC 16.1 (H) 4.0 - 10.5 K/uL   RBC 3.80 (L) 3.87 - 5.11 MIL/uL   Hemoglobin 10.5 (L) 12.0 - 15.0 g/dL   HCT 33.7 (L) 36.0 - 46.0 %   MCV 88.7 80.0 - 100.0 fL   MCH 27.6 26.0 - 34.0 pg   MCHC 31.2 30.0 - 36.0 g/dL   RDW 16.0 (H) 11.5 - 15.5 %   Platelets 267 150 - 400 K/uL   nRBC 0.0 0.0 - 0.2 %  Comprehensive metabolic panel     Status: Abnormal   Collection Time: 07/07/18  1:47 PM  Result Value Ref Range   Sodium 137 135 - 145 mmol/L   Potassium 3.4 (L) 3.5 - 5.1 mmol/L   Chloride 101 98 - 111 mmol/L   CO2 27 22 - 32 mmol/L   Glucose, Bld 217 (H) 70 - 99 mg/dL   BUN 41 (H) 8 - 23 mg/dL   Creatinine, Ser 1.13 (H) 0.44 - 1.00 mg/dL   Calcium 8.5 (L) 8.9 - 10.3 mg/dL   Total Protein 5.8 (L) 6.5 - 8.1 g/dL   Albumin 2.6 (L) 3.5 - 5.0 g/dL   AST 18 15 - 41 U/L   ALT 16 0 - 44 U/L   Alkaline Phosphatase 72 38 - 126 U/L   Total Bilirubin 0.6 0.3 - 1.2 mg/dL   GFR calc non Af Amer 45 (L) >60 mL/min   GFR calc Af Amer 52 (L) >60 mL/min   Anion gap 9 5 - 15  Blood gas, venous      Status: Abnormal   Collection Time: 07/07/18  2:17 PM  Result Value Ref Range   pH, Ven 7.426 7.250 - 7.430   pCO2, Ven 44.1 44.0 - 60.0 mmHg   pO2, Ven below reportable range 32.0 - 45.0 mmHg   Bicarbonate 28.4 (H) 20.0 - 28.0 mmol/L   Acid-Base Excess 4.0 (H) 0.0 - 2.0 mmol/L   O2 Saturation 37.6 %   Patient temperature 98.6    Collection site VENOUS    Drawn by COLLECTED BY LABORATORY    Sample type VENOUS   TSH     Status: None   Collection Time: 07/07/18  2:23 PM  Result Value Ref Range   TSH 2.695 0.350 - 4.500 uIU/mL  Ammonia     Status: None   Collection Time: 07/07/18  2:23 PM  Result Value Ref Range   Ammonia 13 9 - 35 umol/L  Vitamin B12     Status: Abnormal  Collection Time: 07/07/18  2:23 PM  Result Value Ref Range   Vitamin B-12 1,441 (H) 180 - 914 pg/mL  Folate     Status: None   Collection Time: 07/07/18  2:23 PM  Result Value Ref Range   Folate 8.8 >5.9 ng/mL  Urinalysis, Routine w reflex microscopic     Status: Abnormal   Collection Time: 07/07/18  2:48 PM  Result Value Ref Range   Color, Urine YELLOW YELLOW   APPearance CLOUDY (A) CLEAR   Specific Gravity, Urine 1.012 1.005 - 1.030   pH 5.0 5.0 - 8.0   Glucose, UA NEGATIVE NEGATIVE mg/dL   Hgb urine dipstick SMALL (A) NEGATIVE   Bilirubin Urine NEGATIVE NEGATIVE   Ketones, ur NEGATIVE NEGATIVE mg/dL   Protein, ur NEGATIVE NEGATIVE mg/dL   Nitrite NEGATIVE NEGATIVE   Leukocytes,Ua LARGE (A) NEGATIVE   RBC / HPF 11-20 0 - 5 RBC/hpf   WBC, UA >50 (H) 0 - 5 WBC/hpf   Bacteria, UA RARE (A) NONE SEEN   Squamous Epithelial / LPF 6-10 0 - 5   WBC Clumps PRESENT    Mucus PRESENT    Hyaline Casts, UA PRESENT   Glucose, capillary     Status: Abnormal   Collection Time: 07/07/18  4:33 PM  Result Value Ref Range   Glucose-Capillary 217 (H) 70 - 99 mg/dL  Glucose, capillary     Status: Abnormal   Collection Time: 07/07/18  8:17 PM  Result Value Ref Range   Glucose-Capillary 129 (H) 70 - 99 mg/dL   Hemoglobin     Status: Abnormal   Collection Time: 07/07/18  9:33 PM  Result Value Ref Range   Hemoglobin 10.2 (L) 12.0 - 15.0 g/dL  Hematocrit     Status: Abnormal   Collection Time: 07/07/18  9:33 PM  Result Value Ref Range   HCT 33.1 (L) 36.0 - 46.0 %  Comprehensive metabolic panel     Status: Abnormal   Collection Time: 07/08/18  4:48 AM  Result Value Ref Range   Sodium 135 135 - 145 mmol/L   Potassium 3.4 (L) 3.5 - 5.1 mmol/L   Chloride 103 98 - 111 mmol/L   CO2 23 22 - 32 mmol/L   Glucose, Bld 258 (H) 70 - 99 mg/dL   BUN 27 (H) 8 - 23 mg/dL   Creatinine, Ser 1.00 0.44 - 1.00 mg/dL   Calcium 8.5 (L) 8.9 - 10.3 mg/dL   Total Protein 5.5 (L) 6.5 - 8.1 g/dL   Albumin 2.4 (L) 3.5 - 5.0 g/dL   AST 17 15 - 41 U/L   ALT 16 0 - 44 U/L   Alkaline Phosphatase 67 38 - 126 U/L   Total Bilirubin 0.5 0.3 - 1.2 mg/dL   GFR calc non Af Amer 52 (L) >60 mL/min   GFR calc Af Amer >60 >60 mL/min   Anion gap 9 5 - 15  CBC     Status: Abnormal   Collection Time: 07/08/18  4:48 AM  Result Value Ref Range   WBC 14.5 (H) 4.0 - 10.5 K/uL   RBC 3.39 (L) 3.87 - 5.11 MIL/uL   Hemoglobin 9.4 (L) 12.0 - 15.0 g/dL   HCT 30.5 (L) 36.0 - 46.0 %   MCV 90.0 80.0 - 100.0 fL   MCH 27.7 26.0 - 34.0 pg   MCHC 30.8 30.0 - 36.0 g/dL   RDW 15.9 (H) 11.5 - 15.5 %   Platelets 258 150 - 400 K/uL  nRBC 0.0 0.0 - 0.2 %  Glucose, capillary     Status: Abnormal   Collection Time: 07/08/18  7:33 AM  Result Value Ref Range   Glucose-Capillary 168 (H) 70 - 99 mg/dL    Studies/Results: Ct Abdomen Pelvis Wo Contrast  Result Date: 07/07/2018 CLINICAL DATA:  Brownish bowel movement with vomiting and cough a ground emesis. Leukocytosis to 16.1. Assess for gastrointestinal bleed. EXAM: CT ABDOMEN AND PELVIS WITHOUT CONTRAST TECHNIQUE: Multidetector CT imaging of the abdomen and pelvis was performed following the standard protocol without IV contrast. COMPARISON:  07/06/2010 FINDINGS: Lower chest: New pulmonary  consolidations in both lower lobes with air bronchograms consistent with interval development of bilateral pneumonia and atelectasis. Trace left effusion. Top-normal heart size with out pericardial effusion or thickening. Right atrial and ventricular leads are present. Hepatobiliary: The gallbladder is surgically absent. The unenhanced liver is unremarkable. Pancreas: Atrophic pancreas without mass or ductal dilatation. No inflammation. Spleen: Normal Adrenals/Urinary Tract: Normal bilateral adrenal glands. The left kidney is unremarkable. Moderate marked right-sided hydroureteronephrosis and hydroureter is again visualized extending to the pelvic brim where there is a transition in size and caliber of the ureter question stricture. No calculus is identified. No apparent intraluminal mass. Stomach is decompressed by Foley catheter. Stomach/Bowel: Large amount of retained stool is seen within the descending colon through rectosigmoid with transmural thickening suggestive fecal impaction and possible stercoral colitis. Small hiatal hernia. Contrast filled physiologic distention of the stomach. The duodenal sweep and ligament of Treitz are normal. No small bowel obstruction. The appendix is not confidently identified. Vascular/Lymphatic: Aortoiliac atherosclerosis. No aneurysm. No adenopathy. Scattered pelvic phleboliths are noted. Reproductive: Status post hysterectomy. No adnexal masses. Other: No free air nor free fluid. No hernia. Musculoskeletal: Degenerative disc disease and scoliosis of the lumbar spine. No aggressive osseous lesions. IMPRESSION: 1. New bilateral lower lobe pulmonary consolidations with air bronchograms consistent with interval development of bilateral pneumonia and atelectasis. Trace left effusion. 2. Moderate to marked right-sided hydroureteronephrosis and hydroureter extending nor definite ureteral mass. No significant change from prior. Is identified. 3. Large amount of retained stool within  the descending colon through rectosigmoid with slight transmural thickening suggestive of fecal impaction and possible stercoral colitis. 4. Aortoiliac atherosclerosis. Electronically Signed   By: Ashley Royalty M.D.   On: 07/07/2018 20:14      Assessment: Upper GI bleed per history  Plan:   Continue empiric PPI therapy. Consider endoscopy in a day or 2.    Cassell Clement 07/08/2018, 10:53 AM  Pager: [phone] If no answer or after 5 PM call [phone] Lab Results  Component Value Date   HGB 9.4 (L) 07/08/2018   HGB 10.2 (L) 07/07/2018   HGB 10.5 (L) 07/07/2018   HCT 30.5 (L) 07/08/2018   HCT 33.1 (L) 07/07/2018   HCT 33.7 (L) 07/07/2018   ALKPHOS 67 07/08/2018   ALKPHOS 72 07/07/2018   AST 17 07/08/2018   AST 18 07/07/2018   ALT 16 07/08/2018   ALT 16 07/07/2018

## 2018-07-08 NOTE — Evaluation (Signed)
Physical Therapy Evaluation Patient Details Name: Danielle Dennis MRN: [id number] DOB: October 25, 1933 Today's Date: 07/08/2018   History of Present Illness  85 year old Caucasian female from  assisted living with past medical history of hypertension, type 2 diabetes mellitus, COPD, hyponatremia hypothyroidism, dCHF who was adm  to ED on 07/03/2018  at [facility] d/t issues with foly catheter not draining & c/o  generalized weakness with difficulty amb, and blood in stool, transsferred to [facility] on 2/16 d/t no GI service at [facility]  Pt admitted with above diagnosis. Pt currently with functional limitations due to the deficits listed below (see PT Problem List). Pt is profoundly weak and deconditioned although it is difficult to determine her baseline at her ALF as she is not a reliable historian.will need SNF post acute.  Pt will benefit from skilled PT to increase their independence and safety with mobility to allow discharge to the venue listed below.       Follow Up Recommendations SNF    Equipment Recommendations  None recommended by PT    Recommendations for Other Services       Precautions / Restrictions Precautions Precautions: Fall Restrictions Weight Bearing Restrictions: No      Mobility  Bed Mobility Overal bed mobility: Needs Assistance Bed Mobility: Rolling;Sidelying to Sit;Sit to Supine Rolling: Max assist;+2 for safety/equipment Sidelying to sit: Max assist;+2 for physical assistance;+2 for safety/equipment Supine to sit: Total assist;+2 for physical assistance;+2 for safety/equipment     General bed mobility comments: pt requires multi-modal cues and max encouragement to mobilize; requires assist with LEs and trunk in both directions  Transfers                 General transfer comment: unable  Ambulation/Gait             General Gait Details: unable  Stairs            Wheelchair Mobility    Modified Rankin  (Stroke Patients Only)       Balance Overall balance assessment: Needs assistance(denies falls)   Sitting balance-Leahy Scale: Zero Sitting balance - Comments: mod to max assist to maintain midline Postural control: Posterior lean                                   Pertinent Vitals/Pain Pain Assessment: Faces Faces Pain Scale: Hurts worst Pain Location: back, diffuse pain all over Pain Descriptors / Indicators: Grimacing;Guarding;Moaning Pain Intervention(s): Monitored during session;Limited activity within patient's tolerance    Home Living Family/patient expects to be discharged to:: Skilled nursing facility                 Additional Comments: pt is from ALF per chart    Prior Function Level of Independence: Needs assistance   Gait / Transfers Assistance Needed: pt reports independent transfers at ALF, she is unable to recall how long ago she was ambulatory and states "it's been a awhile"  ADL's / Homemaking Assistance Needed: pt reports assist with ADLs  Comments: pt is not a reliable historian, no family present      Hand Dominance        Extremity/Trunk Assessment   Upper Extremity Assessment Upper Extremity Assessment: RUE deficits/detail;LUE deficits/detail RUE Deficits / Details: AAROM grossly WFL, strength 2+ to 3/5 LUE Deficits / Details: shoulder flexion to 60* and c/o pain at that point; strength 2/5, grip fair    Lower  Extremity Assessment Lower Extremity Assessment: RLE deficits/detail;LLE deficits/detail RLE Deficits / Details: AAROM grossly WFL, strength grossly 2+/5 throughout  LLE Deficits / Details: same as above       Communication   Communication: No difficulties  Cognition Arousal/Alertness: Awake/alert Behavior During Therapy: Flat affect   Area of Impairment: Orientation;Memory;Following commands;Problem solving                 Orientation Level: Time   Memory: Decreased short-term memory Following  Commands: Follows one step commands with increased time;Follows multi-step commands inconsistently     Problem Solving: Slow processing;Decreased initiation;Difficulty sequencing;Requires verbal cues;Requires tactile cues General Comments: pt is unable to state the year or day of week, she says she is at "[place]", she is unclear as to the reason or circumstances      General Comments      Exercises     Assessment/Plan    PT Assessment Patient needs continued PT services  PT Problem List Decreased strength;Decreased balance;Decreased range of motion;Decreased cognition;Decreased activity tolerance;Decreased mobility;Decreased knowledge of use of DME;Pain       PT Treatment Interventions DME instruction;Functional mobility training;Patient/family education;Gait training;Therapeutic activities;Therapeutic exercise;Balance training    PT Goals (Current goals can be found in the Care Plan section)  Acute Rehab PT Goals Patient Stated Goal: get stronger and feel better PT Goal Formulation: With patient Time For Goal Achievement: 07/22/18 Potential to Achieve Goals: Good    Frequency Min 2X/week   Barriers to discharge        Co-evaluation               AM-PAC PT "6 Clicks" Mobility  Outcome Measure Help needed turning from your back to your side while in a flat bed without using bedrails?: Total Help needed moving from lying on your back to sitting on the side of a flat bed without using bedrails?: Total Help needed moving to and from a bed to a chair (including a wheelchair)?: Total Help needed standing up from a chair using your arms (e.g., wheelchair or bedside chair)?: Total Help needed to walk in hospital room?: Total Help needed climbing 3-5 steps with a railing? : Total 6 Click Score: 6    End of Session   Activity Tolerance: Patient limited by fatigue;Patient limited by pain Patient left: in bed;with bed alarm set;with call bell/phone within reach Nurse  Communication: Mobility status PT Visit Diagnosis: Muscle weakness (generalized) (M62.81)    Time: 6578-4696 PT Time Calculation (min) (ACUTE ONLY): 25 min   Charges:   PT Evaluation $PT Eval Low Complexity: 1 Low PT Treatments $Therapeutic Activity: 8-22 mins        Kenyon Ana, PT  Pager: [phone] Acute Rehab Dept [facility]): [phone]   07/08/2018   [facility] 07/08/2018, 3:00 PM

## 2018-07-08 NOTE — Evaluation (Signed)
Occupational Therapy Evaluation Patient Details Name: Danielle Dennis MRN: [id number] DOB: February 20, 1934 Today's Date: 07/08/2018    History of Present Illness Patient is an 85 year old Caucasian female from local assisted living with past medical history of hypertension, type 2 diabetes mellitus, COPD, hyponatremia hypothyroidism, diastolic heart failure who was sent to the emergency department on 07/03/2018 due to "Foley not draining right all day".  She also complained of generalized weakness with difficulty to ambulate. PER CHART SHE HAD BLOOD IN STOOL.    Clinical Impression   PATIENT WAS LETHARGIC DURING SESSION AND REQUIRED CUES TO STAY AWAKE. PATIENT DID NOT WANT TO SIT EOB OR TO GET OUT OF BED THIS AM. PATIENT WAS AGREEABLE TO BED LEVEL EVALUATION. PATIENT WAS ABLE TO FOLLOW DIRECTIONS WITH CUES. PATIENT TO BE FOLLOWED BY ACUTE OT.     Follow Up Recommendations  SNF    Equipment Recommendations  None recommended by OT    Recommendations for Other Services       Precautions / Restrictions Precautions Precautions: Fall      Mobility Bed Mobility Overal bed mobility: Needs Assistance Bed Mobility: Rolling Rolling: Mod assist            Transfers                      Balance                                           ADL either performed or assessed with clinical judgement   ADL Overall ADL's : Needs assistance/impaired Eating/Feeding: Set up   Grooming: Wash/dry hands;Wash/dry face;Oral care;Minimal assistance   Upper Body Bathing: Minimal assistance   Lower Body Bathing: Maximal assistance   Upper Body Dressing : Moderate assistance   Lower Body Dressing: Total assistance               Functional mobility during ADLs: (PATIENT REFUSED EOB OR OOB ACTIVITY SECONDARY TO DIARRHEA.) General ADL Comments: PATIENT DID NOT WANT TO SIT EOB AND BED LEVEL EVALUATIION PERFORMED.      Vision Baseline Vision/History: Wears glasses Wears  Glasses: At all times Patient Visual Report: No change from baseline       Perception     Praxis      Pertinent Vitals/Pain Pain Assessment: 0-10 Pain Score: 8  Pain Location: HAND Pain Descriptors / Indicators: Aching Pain Intervention(s): Limited activity within patient's tolerance;Monitored during session(PATIENT STATES SHE DOES NOT WANT PAIN MEDICINE)     Hand Dominance Right   Extremity/Trunk Assessment Upper Extremity Assessment Upper Extremity Assessment: Generalized weakness           Communication Communication Communication: No difficulties   Cognition Arousal/Alertness: Lethargic Behavior During Therapy: Flat affect Overall Cognitive Status: No family/caregiver present to determine baseline cognitive functioning                                 General Comments: PATIENT ABLE TO FOLLOW BASIC ONE STEP DIRECTIONS.    General Comments       Exercises     Shoulder Instructions      Home Living Family/patient expects to be discharged to:: Skilled nursing facility  Additional Comments: PATIENT LIVES IN ASSISTED LIVING.       Prior Functioning/Environment Level of Independence: Needs assistance(PATIENT STATED THEY OCCASIONALLY NEEDED TO HELP. )        Comments: UNSURE IF PATIENT IS AN ACCURIATE HISTORIAN.         OT Problem List: Decreased activity tolerance;Decreased knowledge of use of DME or AE      OT Treatment/Interventions: Self-care/ADL training;DME and/or AE instruction;Therapeutic activities;Patient/family education    OT Goals(Current goals can be found in the care plan section) Acute Rehab OT Goals Patient Stated Goal: get stronger and feel better OT Goal Formulation: With patient Time For Goal Achievement: 07/22/18 Potential to Achieve Goals: Good ADL Goals Pt Will Perform Grooming: (P) with supervision Pt Will Perform Upper Body Bathing: (P) with supervision Pt Will  Perform Lower Body Bathing: (P) with min assist Pt Will Perform Upper Body Dressing: (P) with supervision Pt Will Perform Lower Body Dressing: (P) with min assist Pt Will Transfer to Toilet: (P) with min assist Pt Will Perform Toileting - Clothing Manipulation and hygiene: (P) with min assist  OT Frequency: Min 2X/week   Barriers to D/C:            Co-evaluation              AM-PAC OT "6 Clicks" Daily Activity     Outcome Measure Help from another person eating meals?: A Little Help from another person taking care of personal grooming?: A Little Help from another person toileting, which includes using toliet, bedpan, or urinal?: Total Help from another person bathing (including washing, rinsing, drying)?: A Lot Help from another person to put on and taking off regular upper body clothing?: A Lot Help from another person to put on and taking off regular lower body clothing?: A Lot 6 Click Score: 13   End of Session Nurse Communication: (ok therapy)  Activity Tolerance: Patient limited by fatigue;Patient limited by lethargy Patient left: in bed;with call bell/phone within reach;with bed alarm set  OT Visit Diagnosis: Unsteadiness on feet (R26.81);Muscle weakness (generalized) (M62.81)                Time: 7711-6579 OT Time Calculation (min): 31 min Charges:  OT General Charges $OT Visit: 1 Visit OT Evaluation $OT Eval Low Complexity: 1 Low  6 CLICKS  Bruk Tumolo 07/08/2018, 9:59 AM

## 2018-07-09 LAB — GLUCOSE, CAPILLARY
GLUCOSE-CAPILLARY: 181 mg/dL — AB (ref 70–99)
Glucose-Capillary: 243 mg/dL — ABNORMAL HIGH (ref 70–99)
Glucose-Capillary: 185 mg/dL — ABNORMAL HIGH (ref 70–99)
Glucose-Capillary: 158 mg/dL — ABNORMAL HIGH (ref 70–99)

## 2018-07-09 LAB — HEMOGLOBIN
Hemoglobin: 8.9 g/dL — ABNORMAL LOW (ref 12.0–15.0)
Hemoglobin: 8.9 g/dL — ABNORMAL LOW (ref 12.0–15.0)

## 2018-07-09 LAB — HEMATOCRIT
HCT: 29.1 % — ABNORMAL LOW (ref 36.0–46.0)
HEMATOCRIT: 28.7 % — AB (ref 36.0–46.0)

## 2018-07-09 LAB — MRSA PCR SCREENING: MRSA by PCR: POSITIVE — AB

## 2018-07-09 LAB — URINE CULTURE: Culture: 30000 — AB

## 2018-07-09 MED ORDER — BUMETANIDE 1 MG PO TABS
6.0000 mg | ORAL_TABLET | Freq: Every day | ORAL | Status: DC
  Administered 2018-07-10 – 2018-07-12 (×3): 6 mg via ORAL
  Filled 2018-07-09 (×3): qty 6

## 2018-07-09 MED ORDER — BUMETANIDE 1 MG PO TABS
4.0000 mg | ORAL_TABLET | Freq: Every day | ORAL | Status: DC
  Administered 2018-07-09 – 2018-07-11 (×3): 4 mg via ORAL
  Filled 2018-07-09 (×4): qty 4

## 2018-07-09 MED ORDER — BUMETANIDE 1 MG PO TABS: 4.0000 mg | ORAL_TABLET | ORAL | Status: DC

## 2018-07-09 MED ORDER — POLYETHYLENE GLYCOL 3350 17 G PO PACK
17.0000 g | PACK | Freq: Every day | ORAL | Status: DC
  Administered 2018-07-09 – 2018-07-10 (×2): 17 g via ORAL
  Filled 2018-07-09 (×2): qty 1

## 2018-07-09 MED ORDER — CHLORHEXIDINE GLUCONATE CLOTH 2 % EX PADS
6.0000 | MEDICATED_PAD | Freq: Every day | CUTANEOUS | Status: AC
  Administered 2018-07-10 – 2018-07-14 (×5): 6 via TOPICAL

## 2018-07-09 MED ORDER — MUPIROCIN 2 % EX OINT
1.0000 "application " | TOPICAL_OINTMENT | Freq: Two times a day (BID) | CUTANEOUS | Status: AC
  Administered 2018-07-09 – 2018-07-14 (×10): 1 via NASAL
  Filled 2018-07-09 (×2): qty 22

## 2018-07-09 MED ORDER — SODIUM CHLORIDE 0.9 % IV SOLN
INTRAVENOUS | Status: DC | PRN
  Administered 2018-07-09 – 2018-07-17 (×2): 250 mL via INTRAVENOUS

## 2018-07-09 NOTE — Progress Notes (Signed)
MRSA PCR (+). Standing orders initiated per protocol.

## 2018-07-09 NOTE — Clinical Social Work Note (Signed)
Clinical Social Work Assessment  Patient Details  Name: Danielle Dennis MRN: [id number] Date of Birth: 1934-01-05  Date of referral:  07/09/18               Reason for consult:  Discharge Planning, Facility Placement                Permission sought to share information with:  Family Supports, Case Manager Permission granted to share information::  Yes, Verbal Permission Granted  Name::        Agency::     Relationship::     Contact Information:     Housing/Transportation Living arrangements for the past 2 months:  [place], [place] of Information:  Power of [place], Adult Children Patient Interpreter Needed:  None Criminal Activity/Legal Involvement Pertinent to Current Situation/Hospitalization:  No - Comment as needed Significant Relationships:  Adult Children Lives with:  Facility Resident Do you feel safe going back to the place where you live?  Yes Need for family participation in patient care:  Yes  Care giving concerns:   Patient is an 85 year old Caucasian female from local assisted living with past medical history of hypertension, type 2 diabetes mellitus, COPD, hyponatremia hypothyroidism, diastolic heart failure who was sent to the emergency department on 07/03/2018 due to "Foley not draining right all day".  She also complained of generalized weakness with difficulty to ambulate.   Social Worker assessment / plan:  CSW discussed disposition planning with the patient daughter at bedside. Patient daughter express concerns about the patient care and process at previous hospital. She reports she is concern the patient cannot return to ALF with her required level of care at this time.  Patient daughter reports the patient has issues with the indwelling caterer not draining and has generalized weakness. The patient daughter understands the patient may need long term care placement at SNF.    FL2 complete.  Plan: SNF for rehab.   Employment  status:    Insurance information:  Medicare PT Recommendations:    Information / Referral to community resources:  [place]  Patient/Family's Response to care:  Agreeable and Responding well to care.   Patient/Family's Understanding of and Emotional Response to Diagnosis, Current Treatment, and Prognosis: Patient daughter is very involved in the patient care and has been proactive in learning about the patient current treatment and prognosis.   Emotional Assessment Appearance:  Appears stated age Attitude/Demeanor/Rapport:    Affect (typically observed):  Accepting Orientation:  Oriented to Self Alcohol / Substance use:  Not Applicable Psych involvement (Current and /or in the community):  No (Comment)  Discharge Needs  Concerns to be addressed:  Discharge Planning Concerns Readmission within the last 30 days:  No Current discharge risk:  Dependent with Mobility Barriers to Discharge:  Continued Medical Work up   [employer], LCSW 07/09/2018, 1:41 PM

## 2018-07-09 NOTE — Progress Notes (Signed)
PROGRESS NOTE    Danielle Dennis  VXB:[id number] DOB: 1934/04/06 DOA: 07/07/2018 PCP: Emmaline Kluver, MD    Brief Narrative:  85 y.o. female with medical history significant of HFpEF, COPD, T2DM, HTN, Hypothyroidism, ESBL e coli bacteremia and multiple other medical problems who presented to [facility] initially with concern for the "foley not draining right all day" on 07/03/18.  Pt also noted generalized weakness and difficulty ambulating.  Urine cx done on   Patient is an 85 year old Caucasian female from local assisted living with past medical history of hypertension, type 2 diabetes mellitus, COPD, hyponatremia hypothyroidism, diastolic heart failure who was sent to the emergency department on 07/03/2018 due to "Foley not draining right all day".  She also complained of generalized weakness with difficulty to ambulate.   ED labs showed leukocytosis, hyponatremia, hypokalemia, AKI, and hyperglycemia.  Urine cx from 06/29/2018 showed ESBL E. Coli and pan sensitive E faecium.  Pt follows with Dr. Emi Holes (urology, who had started pt on ceftin and then subsequently changed to augmentin).  Initially plan was for observation and d/c to SNF on 2/13, but she had severe hypokalemia to 2.1 and acute kidney injury.  On 2/14, plan for d/c to SNF, but pt had large bowel movement with blood.  Pt was monitored another night and did not have any additional episdoes for blood in stool or black stool, but then started having episodes of coffee-ground low volume emesis.  No known hx of GI bleed.  Daughter discussed with OSH provider and desired endoscopic observation.  Pt was transferred to [facility] as no GI coverage at [facility].    On my evaluation, pt is sleepy and unable to give much history.  She describes some abdominal pain.  But isn't able to clearly say why she's here at [facility].  Her daughter notes she was doing well about 7 days ago.  She notes that she was treated for ESBL e coli bacteremia and  the PICC was removed several days ago.  Sometime after that about 7 days ago, she became sleepy like she is now.  Less able to move around, ambulate.   Assessment & Plan:   Principal Problem:   GI bleed Active Problems:   UTI due to extended-spectrum beta lactamase (ESBL) producing Escherichia coli   (HFpEF) heart failure with preserved ejection fraction (HCC)   T2DM (type 2 diabetes mellitus) (HCC)  GI bleed:  -several episodesof low volume coffee ground emesis and 1 episode of BRBPR in the past 3 days per OSH records. -PPI BID -GI consulted. No longer plans for endoscopy as pt seems to be improved  Urinary Tract Infection  Indwelling Foley Catheter:   -Of note, she recently had ESBL bacteremia in January, treated with 7 days ertapenem.   -Urine cx on 2/8 and on 2/11 positive for ESBL e. Coli and enterococcus faecium -Augmentin per Dr. Emi Holes (urology) at OSH.  Started on ertapenem at presentation at OSH, but transitioned back to augmentin. -currently on carbapenem given ESBL  Abdominal Discomfort:  -large stool on CT abd -Required manual disimpaction. Continue cathartics as tolerated   Acute Kidney Injury:  -Baseline creatinine 0.8-1 per OSH records, at presentation to [facility] was 2.1 -Improved -She's on bumex/metalazone, currently being held - will need close follow up when resumed -Repeat bmet in AM  Hypokalemia:  -potassium of 2.1 on 2/13, likely related to metalozone and bumex.   -Continue to replace as tolerated  Hyponatremia: -improved here.   -Noted at OSH.  chronic  per OSH records, thiazide diuretic likely was contributing to this, would follow labs closely once this is resumed. -Recheck BMET in AM  Acute Encephalopathy:  -a&ox2.   -Appears drowsy this AM -Stable at this time  Generalized Weakness and Deconditioning: PT/OT Dietician -Stable at present  Chronic Hypoxic Resp Failure  COPD: continue O2 (not clear to me based on OSH records or  discussion with pt how much she uses chronically) Breo ellipta at home, use budesonide/brovana here -Continue on 4LNC, baseline RA -continue bronchodilator as tolerated, wean O2 as tolerated  Type 2 DM: SSI.  Holding [place].  Hydronephrosis: CT urogram on 06/29/2018 showed mild to moderate R hydroureteronehrosis which terminates at the junction of middle and distal thirds of the R ureter.  No urinary tract calculi.  The possibility of a ureteral stricture or urotheial lesion in the ureter should be considered.  Urologic consultation is recommended.  These findings could be better evaluated with follow up hematuria protocal CT scan if clinically appropriate. -Seems stable at present  Hypertension: holding amlodipine, hydralazine for now -BP stable and controlled at present  Hypothyroidism: Continue synthroid as tolerated  GERD: PPI as noted above  Chronic Diastolic HF: She does have bilateral LE edema. Held diuretics prior to CT study with contrast -Seems euvolemic at this time -Renal function stable. Will resume Bumex per home regimen  Hormone Replacement Therapy: currently holding estradiol.  Consider discontinuing given risk with continued use at her age.  DVT prophylaxis: SCD's Code Status: DNR Family Communication: Pt in room, family at bedside Disposition Plan: Uncertain at this time  Consultants:   GI  Procedures:     Antimicrobials: Anti-infectives (From admission, onward)   Start     Dose/Rate Route Frequency Ordered Stop   07/07/18 1600  ertapenem (INVANZ) 1,000 mg in sodium chloride 0.9 % 100 mL IVPB     1 g 200 mL/hr over 30 Minutes Intravenous Every 24 hours 07/07/18 1431        Subjective: Multiple bm noted overnight. Mildly confused this AM  Objective: Vitals:   07/09/18 0500 07/09/18 0603 07/09/18 0906 07/09/18 1618  BP:  (!) 122/50  121/73  Pulse:  83  82  Resp:  20  20  Temp:  98 F (36.7 C)  98.3 F (36.8 C)  TempSrc:  Oral  Oral  SpO2:   92% 90% 96%  Weight: 96.4 kg     Height:        Intake/Output Summary (Last 24 hours) at 07/09/2018 1648 Last data filed at 07/09/2018 1442 Gross per 24 hour  Intake 320 ml  Output 1425 ml  Net -1105 ml   Filed Weights   07/07/18 1442 07/08/18 0500 07/09/18 0500  Weight: 98 kg 98.2 kg 96.4 kg    Examination: General exam: Conversant, in no acute distress Respiratory system: normal chest rise, clear, no audible wheezing Cardiovascular system: regular rhythm, s1-s2 Gastrointestinal system: decreased BS, mildly tender Central nervous system: No seizures, no tremors Extremities: No cyanosis, no joint deformities Skin: No rashes, no pallor Psychiatry: Affect normal // no auditory hallucinations   Data Reviewed: I have personally reviewed following labs and imaging studies  CBC: Recent Labs  Lab 07/07/18 1347  07/08/18 0448 07/08/18 1307 07/08/18 2112 07/09/18 0542 07/09/18 1320  WBC 16.1*  --  14.5*  --   --   --   --   HGB 10.5*   < > 9.4* 10.2* 9.3* 8.9* 8.9*  HCT 33.7*   < > 30.5* 33.2*  30.1* 29.1* 28.7*  MCV 88.7  --  90.0  --   --   --   --   PLT 267  --  258  --   --   --   --    < > = values in this interval not displayed.   Basic Metabolic Panel: Recent Labs  Lab 07/07/18 1347 07/08/18 0448  NA 137 135  K 3.4* 3.4*  CL 101 103  CO2 27 23  GLUCOSE 217* 258*  BUN 41* 27*  CREATININE 1.13* 1.00  CALCIUM 8.5* 8.5*   GFR: Estimated Creatinine Clearance: 47 mL/min (by C-G formula based on SCr of 1 mg/dL). Liver Function Tests: Recent Labs  Lab 07/07/18 1347 07/08/18 0448  AST 18 17  ALT 16 16  ALKPHOS 72 67  BILITOT 0.6 0.5  PROT 5.8* 5.5*  ALBUMIN 2.6* 2.4*   No results for input(s): LIPASE, AMYLASE in the last 168 hours. Recent Labs  Lab 07/07/18 1423  AMMONIA 13   Coagulation Profile: No results for input(s): INR, PROTIME in the last 168 hours. Cardiac Enzymes: No results for input(s): CKTOTAL, CKMB, CKMBINDEX, TROPONINI in the last 168  hours. BNP (last 3 results) No results for input(s): PROBNP in the last 8760 hours. HbA1C: No results for input(s): HGBA1C in the last 72 hours. CBG: Recent Labs  Lab 07/08/18 1720 07/08/18 2155 07/09/18 0809 07/09/18 1214 07/09/18 1636  GLUCAP 184* 315* 181* 243* 185*   Lipid Profile: No results for input(s): CHOL, HDL, LDLCALC, TRIG, CHOLHDL, LDLDIRECT in the last 72 hours. Thyroid Function Tests: Recent Labs    07/07/18 1423  TSH 2.695   Anemia Panel: Recent Labs    07/07/18 1423  VITAMINB12 1,441*  FOLATE 8.8   Sepsis Labs: No results for input(s): PROCALCITON, LATICACIDVEN in the last 168 hours.  Recent Results (from the past 240 hour(s))  Culture, Urine     Status: Abnormal   Collection Time: 07/07/18  2:48 PM  Result Value Ref Range Status   Specimen Description   Final    URINE, RANDOM Performed at [facility] [address], [place], [place] [address]    Special Requests   Final    NONE Performed at [facility], [place] [address] [place], [place] [address]    Culture 30,000 COLONIES/mL YEAST (A)  Final   Report Status 07/09/2018 FINAL  Final  Culture, blood (routine x 2)     Status: None (Preliminary result)   Collection Time: 07/07/18  3:40 PM  Result Value Ref Range Status   Specimen Description   Final    RIGHT ANTECUBITAL Performed at [facility] [address], [place], [place] [address]    Special Requests   Final    BOTTLES DRAWN AEROBIC AND ANAEROBIC Blood Culture adequate volume Performed at [facility] [address], [place], [place] [address]    Culture   Final    NO GROWTH 2 DAYS Performed at [facility] [address], [place], [place] [address]    Report Status PENDING  Incomplete  Culture, blood (routine x 2)     Status: None (Preliminary result)   Collection Time: 07/07/18  3:50 PM  Result Value Ref Range Status   Specimen Description    Final    BLOOD RIGHT ARM Performed at [facility] [address], [place], [place] [address]    Special Requests   Final    BOTTLES  DRAWN AEROBIC ONLY Blood Culture adequate volume Performed at [facility] [address] [place], [place] [address]    Culture   Final    NO GROWTH 2 DAYS Performed at [facility] [address], [place], [place] [address]    Report Status PENDING  Incomplete  MRSA PCR Screening     Status: Abnormal   Collection Time: 07/09/18 11:43 AM  Result Value Ref Range Status   MRSA by PCR POSITIVE (A) NEGATIVE Final    Comment:        The GeneXpert MRSA Assay (FDA approved for NASAL specimens only), is one component of a comprehensive MRSA colonization surveillance program. It is not intended to diagnose MRSA infection nor to guide or monitor treatment for MRSA infections. RESULT CALLED TO, READ BACK BY AND VERIFIED WITH: C.ELLIS AT 1544 ON 07/09/18 BY N.THOMPSON Performed at [facility], [place] [address], [place],  [address]      Radiology Studies: Ct Abdomen Pelvis Wo Contrast  Result Date: 07/07/2018 CLINICAL DATA:  Brownish bowel movement with vomiting and cough a ground emesis. Leukocytosis to 16.1. Assess for gastrointestinal bleed. EXAM: CT ABDOMEN AND PELVIS WITHOUT CONTRAST TECHNIQUE: Multidetector CT imaging of the abdomen and pelvis was performed following the standard protocol without IV contrast. COMPARISON:  07/06/2010 FINDINGS: Lower chest: New pulmonary consolidations in both lower lobes with air bronchograms consistent with interval development of bilateral pneumonia and atelectasis. Trace left effusion. Top-normal heart size with out pericardial effusion or thickening. Right atrial and ventricular leads are present. Hepatobiliary: The gallbladder is surgically absent. The unenhanced liver is unremarkable. Pancreas: Atrophic pancreas without mass or ductal dilatation.  No inflammation. Spleen: Normal Adrenals/Urinary Tract: Normal bilateral adrenal glands. The left kidney is unremarkable. Moderate marked right-sided hydroureteronephrosis and hydroureter is again visualized extending to the pelvic brim where there is a transition in size and caliber of the ureter question stricture. No calculus is identified. No apparent intraluminal mass. Stomach is decompressed by Foley catheter. Stomach/Bowel: Large amount of retained stool is seen within the descending colon through rectosigmoid with transmural thickening suggestive fecal impaction and possible stercoral colitis. Small hiatal hernia. Contrast filled physiologic distention of the stomach. The duodenal sweep and ligament of Treitz are normal. No small bowel obstruction. The appendix is not confidently identified. Vascular/Lymphatic: Aortoiliac atherosclerosis. No aneurysm. No adenopathy. Scattered pelvic phleboliths are noted. Reproductive: Status post hysterectomy. No adnexal masses. Other: No free air nor free fluid. No hernia. Musculoskeletal: Degenerative disc disease and scoliosis of the lumbar spine. No aggressive osseous lesions. IMPRESSION: 1. New bilateral lower lobe pulmonary consolidations with air bronchograms consistent with interval development of bilateral pneumonia and atelectasis. Trace left effusion. 2. Moderate to marked right-sided hydroureteronephrosis and hydroureter extending nor definite ureteral mass. No significant change from prior. Is identified. 3. Large amount of retained stool within the descending colon through rectosigmoid with slight transmural thickening suggestive of fecal impaction and possible stercoral colitis. 4. Aortoiliac atherosclerosis. Electronically Signed   By: Ashley Royalty M.D.   On: 07/07/2018 20:14    Scheduled Meds: . arformoterol  15 mcg Nebulization BID  . budesonide (PULMICORT) nebulizer solution  0.25 mg Nebulization BID  . [START ON 07/10/2018] Chlorhexidine Gluconate  Cloth  6 each Topical Q0600  . docusate sodium  100 mg Oral Daily  . feeding supplement  1 Container Oral TID BM  . insulin aspart  0-5 Units Subcutaneous QHS  . insulin aspart  0-9 Units Subcutaneous TID WC  .  levothyroxine  125 mcg Oral Q0600  . mouth rinse  15 mL Mouth Rinse BID  . mupirocin ointment  1 application Nasal BID  . nystatin   Topical TID  . pantoprazole (PROTONIX) IV  40 mg Intravenous Q12H  . polyethylene glycol  17 g Oral Daily  . polyvinyl alcohol  1 drop Both Eyes QHS   Continuous Infusions: . ertapenem Stopped (07/08/18 1827)     LOS: 2 days   Marylu Lund, MD [facility] Pager On Amion  If 7PM-7AM, please contact night-coverage 07/09/2018, 4:48 PM

## 2018-07-09 NOTE — NC FL2 (Signed)
[place] LEVEL OF CARE SCREENING TOOL     IDENTIFICATION  Patient Name: Danielle Dennis Birthdate: 1933-09-25 Sex: female Admission Date (Current Location): 07/07/2018  [facility] and [place] Number:  Herbalist and Address:  [facility],  [place], [place]      Provider Number: [id number]  Attending Physician Name and Address:  Donne Hazel, MD  Relative Name and Phone Number:       Current Level of Care: Hospital Recommended Level of Care: [place] Prior Approval Number:    Date Approved/Denied:   PASRR Number: Pending  Discharge Plan:      Current Diagnoses: Patient Active Problem List   Diagnosis Date Noted  . GI bleed 07/07/2018  . UTI due to extended-spectrum beta lactamase (ESBL) producing Escherichia coli 07/07/2018  . (HFpEF) heart failure with preserved ejection fraction ([place]) 07/07/2018  . T2DM (type 2 diabetes mellitus) ([place]) 07/07/2018    Orientation RESPIRATION BLADDER Height & Weight     Self  Normal Indwelling catheter Weight: 212 lb 8.4 oz (96.4 kg) Height:  5\' 2"  (157.5 cm)  BEHAVIORAL SYMPTOMS/MOOD NEUROLOGICAL BOWEL NUTRITION STATUS      Incontinent Diet(See discharge Summary )  AMBULATORY STATUS COMMUNICATION OF NEEDS Skin   Extensive Assist Verbally Normal                       Personal Care Assistance Level of Assistance  Bathing, Feeding, Dressing Bathing Assistance: Maximum assistance Feeding assistance: Limited assistance Dressing Assistance: Maximum assistance     Functional Limitations Info  Sight, Hearing, Speech Sight Info: Impaired Hearing Info: Adequate Speech Info: Adequate    SPECIAL CARE FACTORS FREQUENCY  PT (By licensed PT), OT (By licensed OT)     PT Frequency: 5X/week OT Frequency: 5X/week            Contractures Contractures Info: Not present    Additional Factors Info  Code Status, Allergies, Psychotropic, Insulin Sliding Scale,  Isolation Precautions Code Status Info: Fullcode  Allergies Info: Allergies: Codeine   Insulin Sliding Scale Info: See Medication        Current Medications (07/09/2018):  This is the current hospital active medication list Current Facility-Administered Medications  Medication Dose Route Frequency Provider Last Rate Last Dose  . acetaminophen (TYLENOL) tablet 650 mg  650 mg Oral Q6H PRN Elodia Florence., MD   650 mg at 07/09/18 4656   Or  . acetaminophen (TYLENOL) suppository 650 mg  650 mg Rectal Q6H PRN Elodia Florence., MD      . albuterol (PROVENTIL) (2.5 MG/3ML) 0.083% nebulizer solution 2.5 mg  2.5 mg Nebulization Q6H PRN Elodia Florence., MD      . arformoterol [facility]) nebulizer solution 15 mcg  15 mcg Nebulization BID Elodia Florence., MD   15 mcg at 07/09/18 [phone]  . budesonide (PULMICORT) nebulizer solution 0.25 mg  0.25 mg Nebulization BID Elodia Florence., MD   0.25 mg at 07/09/18 5170  . docusate sodium (COLACE) capsule 100 mg  100 mg Oral Daily Donne Hazel, MD   100 mg at 07/09/18 1039  . ertapenem (INVANZ) 1,000 mg in sodium chloride 0.9 % 100 mL IVPB  1 g Intravenous Q24H Elodia Florence., MD   Stopped at 07/08/18 1827  . feeding supplement (BOOST / RESOURCE BREEZE) liquid 1 Container  1 Container Oral TID BM Elodia Florence., MD  1 Container at 07/09/18 1038  . insulin aspart (novoLOG) injection 0-5 Units  0-5 Units Subcutaneous QHS Elodia Florence., MD   4 Units at 07/08/18 2214  . insulin aspart (novoLOG) injection 0-9 Units  0-9 Units Subcutaneous TID WC Elodia Florence., MD   3 Units at 07/09/18 1300  . levothyroxine (SYNTHROID, LEVOTHROID) tablet 125 mcg  125 mcg Oral Q0600 Elodia Florence., MD   125 mcg at 07/09/18 0545  . MEDLINE mouth rinse  15 mL Mouth Rinse BID Elodia Florence., MD   15 mL at 07/09/18 1039  . nystatin (MYCOSTATIN/NYSTOP) topical powder   Topical TID Elodia Florence., MD       . ondansetron [facility]) tablet 4 mg  4 mg Oral Q6H PRN Elodia Florence., MD       Or  . ondansetron [facility]) injection 4 mg  4 mg Intravenous Q6H PRN Elodia Florence., MD      . pantoprazole (PROTONIX) injection 40 mg  40 mg Intravenous Q12H Elodia Florence., MD   40 mg at 07/09/18 1039  . polyethylene glycol (MIRALAX / GLYCOLAX) packet 17 g  17 g Oral Daily Donne Hazel, MD   17 g at 07/09/18 1039  . polyvinyl alcohol (LIQUIFILM TEARS) 1.4 % ophthalmic solution 1 drop  1 drop Both Eyes QHS Elodia Florence., MD   1 drop at 07/08/18 2214     Discharge Medications: Please see discharge summary for a list of discharge medications.  Relevant Imaging Results:  Relevant Lab Results:   Additional Information ssn:[id number]  Lia Hopping, LCSW

## 2018-07-09 NOTE — Progress Notes (Signed)
[facility] Progress Note  Subjective: The patient states again today she feels a little rough. No specific complaints other than that however. There are no reports of vomiting blood. The nurse reported that the patient had a stool last night and there was a little blood around a brown stool. Recent CT scan shows bilateral pneumonia. Also stool in colon with possible stercoral colitis. Hemoglobin and hematocrit relatively stable.  Objective: Vital signs in last 24 hours: Temp:  [98 F (36.7 C)-100.9 F (38.3 C)] 98 F (36.7 C) (02/18 0603) Pulse Rate:  [79-87] 83 (02/18 0603) Resp:  [18-20] 20 (02/18 0603) BP: (122-137)/(43-55) 122/50 (02/18 0603) SpO2:  [90 %-97 %] 90 % (02/18 0906) Weight:  [96.4 kg] 96.4 kg (02/18 0500) Weight change: -1.6 kg   PE:  No distress  Heart regular rhythm  Abdomen soft nontender  Lab Results: Results for orders placed or performed during the hospital encounter of 07/07/18 (from the past 24 hour(s))  Glucose, capillary     Status: Abnormal   Collection Time: 07/08/18 11:36 AM  Result Value Ref Range   Glucose-Capillary 186 (H) 70 - 99 mg/dL  Hemoglobin     Status: Abnormal   Collection Time: 07/08/18  1:07 PM  Result Value Ref Range   Hemoglobin 10.2 (L) 12.0 - 15.0 g/dL  Hematocrit     Status: Abnormal   Collection Time: 07/08/18  1:07 PM  Result Value Ref Range   HCT 33.2 (L) 36.0 - 46.0 %  Glucose, capillary     Status: Abnormal   Collection Time: 07/08/18  5:20 PM  Result Value Ref Range   Glucose-Capillary 184 (H) 70 - 99 mg/dL  Hemoglobin     Status: Abnormal   Collection Time: 07/08/18  9:12 PM  Result Value Ref Range   Hemoglobin 9.3 (L) 12.0 - 15.0 g/dL  Hematocrit     Status: Abnormal   Collection Time: 07/08/18  9:12 PM  Result Value Ref Range   HCT 30.1 (L) 36.0 - 46.0 %  Glucose, capillary     Status: Abnormal   Collection Time: 07/08/18  9:55 PM  Result Value Ref Range   Glucose-Capillary 315 (H) 70 - 99  mg/dL  Hemoglobin     Status: Abnormal   Collection Time: 07/09/18  5:42 AM  Result Value Ref Range   Hemoglobin 8.9 (L) 12.0 - 15.0 g/dL  Hematocrit     Status: Abnormal   Collection Time: 07/09/18  5:42 AM  Result Value Ref Range   HCT 29.1 (L) 36.0 - 46.0 %  Glucose, capillary     Status: Abnormal   Collection Time: 07/09/18  8:09 AM  Result Value Ref Range   Glucose-Capillary 181 (H) 70 - 99 mg/dL    Studies/Results: No results found.    Assessment: UTI  Bilateral pneumonia  Recent hematemesis with no further signs of hematemesis  Rectal bleeding probably from a stercoral colitis      Plan:   Continue antibiotics. I would hold on endoscopy at this time given her clinical situation overall. At this point I would probably only consider endoscopy if therapeutic intervention were needed. Given her age and medical problems currently I think she would pose a high risk for sedation for endoscopic procedures.    SAM F Sarahbeth Cashin 07/09/2018, 11:02 AM  Pager: [phone] If no answer or after 5 PM call [phone] Lab Results  Component Value Date   HGB 8.9 (L) 07/09/2018   HGB 9.3 (L) 07/08/2018  HGB 10.2 (L) 07/08/2018   HCT 29.1 (L) 07/09/2018   HCT 30.1 (L) 07/08/2018   HCT 33.2 (L) 07/08/2018   ALKPHOS 67 07/08/2018   ALKPHOS 72 07/07/2018   AST 17 07/08/2018   AST 18 07/07/2018   ALT 16 07/08/2018   ALT 16 07/07/2018

## 2018-07-10 LAB — CBC
HEMATOCRIT: 32.7 % — AB (ref 36.0–46.0)
Hemoglobin: 10.2 g/dL — ABNORMAL LOW (ref 12.0–15.0)
MCH: 27.9 pg (ref 26.0–34.0)
MCHC: 31.2 g/dL (ref 30.0–36.0)
MCV: 89.6 fL (ref 80.0–100.0)
PLATELETS: 298 10*3/uL (ref 150–400)
RBC: 3.65 MIL/uL — ABNORMAL LOW (ref 3.87–5.11)
RDW: 15.6 % — AB (ref 11.5–15.5)
WBC: 14.2 10*3/uL — ABNORMAL HIGH (ref 4.0–10.5)
nRBC: 0.1 % (ref 0.0–0.2)

## 2018-07-10 LAB — COMPREHENSIVE METABOLIC PANEL
ALT: 23 U/L (ref 0–44)
AST: 21 U/L (ref 15–41)
Albumin: 2.3 g/dL — ABNORMAL LOW (ref 3.5–5.0)
Alkaline Phosphatase: 75 U/L (ref 38–126)
Anion gap: 12 (ref 5–15)
BUN: 12 mg/dL (ref 8–23)
CO2: 28 mmol/L (ref 22–32)
Calcium: 8.4 mg/dL — ABNORMAL LOW (ref 8.9–10.3)
Chloride: 98 mmol/L (ref 98–111)
Creatinine, Ser: 0.95 mg/dL (ref 0.44–1.00)
GFR calc non Af Amer: 56 mL/min — ABNORMAL LOW (ref >60)
Glucose, Bld: 208 mg/dL — ABNORMAL HIGH (ref 70–99)
Potassium: 2 mmol/L — CL (ref 3.5–5.1)
SODIUM: 138 mmol/L (ref 135–145)
Total Bilirubin: 0.5 mg/dL (ref 0.3–1.2)
Total Protein: 5.9 g/dL — ABNORMAL LOW (ref 6.5–8.1)

## 2018-07-10 LAB — GLUCOSE, CAPILLARY
Glucose-Capillary: 189 mg/dL — ABNORMAL HIGH (ref 70–99)
Glucose-Capillary: 224 mg/dL — ABNORMAL HIGH (ref 70–99)
Glucose-Capillary: 200 mg/dL — ABNORMAL HIGH (ref 70–99)
Glucose-Capillary: 171 mg/dL — ABNORMAL HIGH (ref 70–99)

## 2018-07-10 LAB — MAGNESIUM: Magnesium: 1.5 mg/dL — ABNORMAL LOW (ref 1.7–2.4)

## 2018-07-10 MED ORDER — POTASSIUM CHLORIDE CRYS ER 20 MEQ PO TBCR
40.0000 meq | EXTENDED_RELEASE_TABLET | ORAL | Status: AC
  Administered 2018-07-10 (×3): 40 meq via ORAL
  Filled 2018-07-10 (×3): qty 2

## 2018-07-10 MED ORDER — MAGNESIUM SULFATE 2 GM/50ML IV SOLN
2.0000 g | Freq: Once | INTRAVENOUS | Status: AC
  Administered 2018-07-10: 2 g via INTRAVENOUS
  Filled 2018-07-10: qty 50

## 2018-07-10 MED ORDER — POTASSIUM CHLORIDE CRYS ER 20 MEQ PO TBCR: 40.0000 meq | EXTENDED_RELEASE_TABLET | Freq: Three times a day (TID) | ORAL | Status: DC

## 2018-07-10 NOTE — Progress Notes (Signed)
PROGRESS NOTE  Danielle Dennis VOZ:[id number] DOB: June 19, 1933 DOA: 07/07/2018 PCP: Emmaline Kluver, MD  HPI/Recap of past 24 hours: 85 y.o.femalewith medical history significant ofHFpEF, COPD, T2DM, HTN, Hypothyroidism, ESBL e coli bacteremia and multiple other medical problems who presented to [facility] initially with concern for the "foley not draining right all day" on 07/03/18. Pt also noted generalized weakness and difficulty ambulating. Urine cx from 06/29/2018 showed ESBL E. Coli and pan sensitive E faecium. Pt follows with Dr. Emi Holes (urology, who had started pt on ceftin and then subsequently changed to augmentin). Initially plan was for observation and d/c to SNF on 2/13, but she had severe hypokalemia to 2.1 and acute kidney injury. On 2/14, plan for d/c to SNF, but pt had large bowel movement with blood. Pt was monitored another night and did not have any additional episdoes for blood in stool or black stool, but then started having episodes of coffee-ground low volume emesis.  GI was consulted.  No further signs of hematemesis and rectal bleeding probably from stercoral colitis.  GI signed off on 07/10/2018.  07/10/2018: Patient seen and examined at bedside.  She denies any recent GI bleed.  Denies dyspnea at rest.  Admits to intermittent cough.    Assessment/Plan: Principal Problem:   GI bleed Active Problems:   UTI due to extended-spectrum beta lactamase (ESBL) producing Escherichia coli   (HFpEF) heart failure with preserved ejection fraction (HCC)   T2DM (type 2 diabetes mellitus) ([place])   Resolved GI bleed:  GI consulted and signed off on 07/10/2018 -PPI BID -GI consulted. No longer plans for endoscopy as pt seems to be improved  Urinary Tract Infection Indwelling Foley Catheter: -Of note, she recently had ESBL bacteremia in January, treated with 7 days ertapenem. -Urine cx on 2/8 and on 2/11 positive for ESBL e. Coli and enterococcus  faecium -Augmentin per Dr. Emi Holes (urology) at OSH. Started on ertapenem at presentation at OSH, but transitioned back to augmentin. -currently on carbapenem given ESBL   Bilateral HCAP Continue IV antibiotics Independently reviewed chest imaging  Hypokalemia Potassium three 2.0 Repleted with p.o. potassium 40 mEq x 3 doses Also given 2 g IV magnesium once  Type 2 diabetes uncontrolled with hyperglycemia Obtain obtain hemoglobin A1c Start insulin sliding scale Hold Januvia  Abdominal Discomfort: -large stool on CT abd -Required manual disimpaction. Continue cathartics as tolerated  Acute Kidney Injury:  -Baseline creatinine 0.8-1 per OSH records, at presentation to [facility] was 2.1 -Improved -She's on bumex/metalazone, currently being held- will need close follow up when resumed -Repeat bmet in AM  Resolved acute diabetic encephalopathy   Generalized Weakness and Deconditioning: PT assessed and recommended SNF Dietician -Stable at present  Chronic Hypoxic Resp Failure  COPD:continue O2 (not clear to me based on OSH records or discussion with pt how much she uses chronically) Breo ellipta at home, use budesonide/brovana here -Continue on 4LNC, baseline RA -continue bronchodilator as tolerated, wean O2 as tolerated  Hydronephrosis: CT urogram on 06/29/2018 showed mild to moderate R hydroureteronehrosis which terminates at the junction of middle and distal thirds of the R ureter. No urinary tract calculi. The possibility of a ureteral stricture or urotheial lesion in the ureter should be considered. Urologic consultation is recommended. These findings could be better evaluated with follow up hematuria protocal CT scan if clinically appropriate. -Seems stable at present  Hypertension:holdingamlodipine, hydralazinefor now -BP stable and controlled at present  Hypothyroidism: Continue synthroid as tolerated  GERD: PPI as noted above  Chronic  Diastolic  HF:She does have bilateral LE edema.Held diureticsprior to CT study with contrast -Seems euvolemic at this time -Renal function stable. Will resume Bumex per home regimen  Hormone Replacement Therapy: currently holding estradiol. Consider discontinuing given risk with continued use at her age.  DVT prophylaxis: SCD's Code Status: DNR Family Communication:  None at bedside Disposition Plan:  SNF when hemodynamically stable and a bed is available  Consultants:   GI  Procedures:     Antimicrobials:             Anti-infectives (From admission, onward)     Start        Dose/Rate  Route  Frequency  Ordered  Stop     07/07/18 1600    ertapenem (INVANZ) 1,000 mg in sodium chloride 0.9 % 100 mL IVPB       1 g  200 mL/hr over 30 Minutes  Intravenous  Every 24 hours  07/07/18 1431            Objective: Vitals:   07/09/18 1949 07/10/18 0500 07/10/18 0603 07/10/18 0903  BP: 133/67  (!) 121/54   Pulse: 78  80   Resp: 18  19   Temp:   100 F (37.8 C)   TempSrc:   Oral   SpO2: 94%  90% 91%  Weight:  96 kg    Height:        Intake/Output Summary (Last 24 hours) at 07/10/2018 1358 Last data filed at 07/10/2018 0211 Gross per 24 hour  Intake 660 ml  Output 900 ml  Net -240 ml   Filed Weights   07/08/18 0500 07/09/18 0500 07/10/18 0500  Weight: 98.2 kg 96.4 kg 96 kg    Exam:  . General: 85 y.o. year-old female well developed well nourished in no acute distress.  Alert and interactive. . Cardiovascular: Regular rate and rhythm with no rubs or gallops.  No thyromegaly or JVD noted.   Marland Kitchen Respiratory: Clear to auscultation with no wheezes or rales. Good inspiratory effort. . Abdomen: Soft nontender nondistended with normal bowel sounds x4 quadrants. . Musculoskeletal: Trace lower extremity edema. 2/4 pulses in all 4 extremities. Marland Kitchen Psychiatry: Mood is appropriate for condition and setting   Data Reviewed: CBC: Recent Labs  Lab  07/07/18 1347  07/08/18 0448 07/08/18 1307 07/08/18 2112 07/09/18 0542 07/09/18 1320 07/10/18 0420  WBC 16.1*  --  14.5*  --   --   --   --  14.2*  HGB 10.5*   < > 9.4* 10.2* 9.3* 8.9* 8.9* 10.2*  HCT 33.7*   < > 30.5* 33.2* 30.1* 29.1* 28.7* 32.7*  MCV 88.7  --  90.0  --   --   --   --  89.6  PLT 267  --  258  --   --   --   --  298   < > = values in this interval not displayed.   Basic Metabolic Panel: Recent Labs  Lab 07/07/18 1347 07/08/18 0448 07/10/18 0420  NA 137 135 138  K 3.4* 3.4* 2.0*  CL 101 103 98  CO2 27 23 28   GLUCOSE 217* 258* 208*  BUN 41* 27* 12  CREATININE 1.13* 1.00 0.95  CALCIUM 8.5* 8.5* 8.4*  MG  --   --  1.5*   GFR: Estimated Creatinine Clearance: 49.4 mL/min (by C-G formula based on SCr of 0.95 mg/dL). Liver Function Tests: Recent Labs  Lab 07/07/18 1347 07/08/18 0448 07/10/18 0420  AST 18 17 21  ALT 16 16 23   ALKPHOS 72 67 75  BILITOT 0.6 0.5 0.5  PROT 5.8* 5.5* 5.9*  ALBUMIN 2.6* 2.4* 2.3*   No results for input(s): LIPASE, AMYLASE in the last 168 hours. Recent Labs  Lab 07/07/18 1423  AMMONIA 13   Coagulation Profile: No results for input(s): INR, PROTIME in the last 168 hours. Cardiac Enzymes: No results for input(s): CKTOTAL, CKMB, CKMBINDEX, TROPONINI in the last 168 hours. BNP (last 3 results) No results for input(s): PROBNP in the last 8760 hours. HbA1C: No results for input(s): HGBA1C in the last 72 hours. CBG: Recent Labs  Lab 07/09/18 1214 07/09/18 1636 07/09/18 2144 07/10/18 0752 07/10/18 1207  GLUCAP 243* 185* 158* 189* 224*   Lipid Profile: No results for input(s): CHOL, HDL, LDLCALC, TRIG, CHOLHDL, LDLDIRECT in the last 72 hours. Thyroid Function Tests: Recent Labs    07/07/18 1423  TSH 2.695   Anemia Panel: Recent Labs    07/07/18 1423  VITAMINB12 1,441*  FOLATE 8.8   Urine analysis:    Component Value Date/Time   COLORURINE YELLOW 07/07/2018 1448   APPEARANCEUR CLOUDY (A) 07/07/2018 1448    LABSPEC 1.012 07/07/2018 1448   PHURINE 5.0 07/07/2018 1448   GLUCOSEU NEGATIVE 07/07/2018 1448   HGBUR SMALL (A) 07/07/2018 1448   BILIRUBINUR NEGATIVE 07/07/2018 1448   KETONESUR NEGATIVE 07/07/2018 1448   PROTEINUR NEGATIVE 07/07/2018 1448   NITRITE NEGATIVE 07/07/2018 1448   LEUKOCYTESUR LARGE (A) 07/07/2018 1448   Sepsis Labs: @LABRCNTIP (procalcitonin:4,lacticidven:4)  ) Recent Results (from the past 240 hour(s))  Culture, Urine     Status: Abnormal   Collection Time: 07/07/18  2:48 PM  Result Value Ref Range Status   Specimen Description   Final    URINE, RANDOM Performed at [facility], [place] [address], [place], [place] [address]    Special Requests   Final    NONE Performed at [facility], [place] [address], [place], [place] [address]    Culture 30,000 COLONIES/mL YEAST (A)  Final   Report Status 07/09/2018 FINAL  Final  Culture, blood (routine x 2)     Status: None (Preliminary result)   Collection Time: 07/07/18  3:40 PM  Result Value Ref Range Status   Specimen Description   Final    RIGHT ANTECUBITAL Performed at [facility] [address] [place], [place] [address]    Special Requests   Final    BOTTLES DRAWN AEROBIC AND ANAEROBIC Blood Culture adequate volume Performed at [facility] [address], [place], [place] [address]    Culture   Final    NO GROWTH 3 DAYS Performed at [facility], [place] [address] [place], [place] [address]    Report Status PENDING  Incomplete  Culture, blood (routine x 2)     Status: None (Preliminary result)   Collection Time: 07/07/18  3:50 PM  Result Value Ref Range Status   Specimen Description   Final    BLOOD RIGHT ARM Performed at [facility] [address], [place], [place] [address]    Special Requests   Final    BOTTLES DRAWN AEROBIC ONLY Blood Culture adequate volume Performed at [facility] [address] [place], [place] [address]    Culture   Final    NO GROWTH 3 DAYS Performed at [facility], [place] [address] [place], [place] [address]    Report Status PENDING  Incomplete  MRSA PCR Screening     Status: Abnormal   Collection Time: 07/09/18 11:43 AM  Result Value Ref Range Status   MRSA by PCR POSITIVE (A) NEGATIVE Final    Comment:        The GeneXpert MRSA Assay (FDA approved for NASAL specimens only), is one component of a comprehensive MRSA colonization surveillance program. It is not intended to diagnose MRSA infection nor to guide or monitor treatment for MRSA infections. RESULT CALLED TO, READ BACK BY AND VERIFIED WITH: C.ELLIS AT 1544 ON 07/09/18 BY N.THOMPSON Performed at [facility], [place] [address] [place], [place] [address]       Studies: No results found.  Scheduled Meds: . arformoterol  15 mcg Nebulization BID  . budesonide (PULMICORT) nebulizer solution  0.25 mg Nebulization BID  . bumetanide  6 mg Oral QAC breakfast   And  . bumetanide  4 mg Oral q1800  . Chlorhexidine Gluconate Cloth  6 each Topical Q0600  . docusate sodium  100 mg Oral Daily  . feeding supplement  1 Container Oral TID BM  . insulin aspart  0-5 Units Subcutaneous QHS  . insulin aspart  0-9 Units Subcutaneous TID WC  . levothyroxine  125 mcg Oral Q0600  . mouth rinse  15 mL Mouth Rinse BID  . mupirocin ointment  1 application Nasal BID  . nystatin   Topical TID  . pantoprazole (PROTONIX) IV  40 mg Intravenous Q12H  . polyethylene glycol  17 g Oral Daily  . polyvinyl alcohol  1 drop Both Eyes QHS    Continuous Infusions: . sodium chloride 250 mL (07/09/18 1702)  . ertapenem 1,000 mg (07/09/18 1703)     LOS: 3 days     Kayleen Memos, MD [facility] Pager [phone]  If 7PM-7AM, please contact night-coverage [email or web address] Password [email or web address] 07/10/2018, 1:58 PM

## 2018-07-10 NOTE — Progress Notes (Signed)
[facility] Progress Note  Subjective: No signs of need further hematemesis.  Objective: Vital signs in last 24 hours: Temp:  [98.3 F (36.8 C)-100 F (37.8 C)] 100 F (37.8 C) (02/19 0603) Pulse Rate:  [78-82] 80 (02/19 0603) Resp:  [18-20] 19 (02/19 0603) BP: (121-133)/(54-73) 121/54 (02/19 0603) SpO2:  [90 %-96 %] 91 % (02/19 0903) Weight:  [96 kg] 96 kg (02/19 0500) Weight change: -0.4 kg   PE:  No distress  Lab Results: Results for orders placed or performed during the hospital encounter of 07/07/18 (from the past 24 hour(s))  MRSA PCR Screening     Status: Abnormal   Collection Time: 07/09/18 11:43 AM  Result Value Ref Range   MRSA by PCR POSITIVE (A) NEGATIVE  Glucose, capillary     Status: Abnormal   Collection Time: 07/09/18 12:14 PM  Result Value Ref Range   Glucose-Capillary 243 (H) 70 - 99 mg/dL  Hemoglobin     Status: Abnormal   Collection Time: 07/09/18  1:20 PM  Result Value Ref Range   Hemoglobin 8.9 (L) 12.0 - 15.0 g/dL  Hematocrit     Status: Abnormal   Collection Time: 07/09/18  1:20 PM  Result Value Ref Range   HCT 28.7 (L) 36.0 - 46.0 %  Glucose, capillary     Status: Abnormal   Collection Time: 07/09/18  4:36 PM  Result Value Ref Range   Glucose-Capillary 185 (H) 70 - 99 mg/dL  Glucose, capillary     Status: Abnormal   Collection Time: 07/09/18  9:44 PM  Result Value Ref Range   Glucose-Capillary 158 (H) 70 - 99 mg/dL  Comprehensive metabolic panel     Status: Abnormal   Collection Time: 07/10/18  4:20 AM  Result Value Ref Range   Sodium 138 135 - 145 mmol/L   Potassium 2.0 (LL) 3.5 - 5.1 mmol/L   Chloride 98 98 - 111 mmol/L   CO2 28 22 - 32 mmol/L   Glucose, Bld 208 (H) 70 - 99 mg/dL   BUN 12 8 - 23 mg/dL   Creatinine, Ser 0.95 0.44 - 1.00 mg/dL   Calcium 8.4 (L) 8.9 - 10.3 mg/dL   Total Protein 5.9 (L) 6.5 - 8.1 g/dL   Albumin 2.3 (L) 3.5 - 5.0 g/dL   AST 21 15 - 41 U/L   ALT 23 0 - 44 U/L   Alkaline Phosphatase 75 38  - 126 U/L   Total Bilirubin 0.5 0.3 - 1.2 mg/dL   GFR calc non Af Amer 56 (L) >60 mL/min   GFR calc Af Amer >60 >60 mL/min   Anion gap 12 5 - 15  CBC     Status: Abnormal   Collection Time: 07/10/18  4:20 AM  Result Value Ref Range   WBC 14.2 (H) 4.0 - 10.5 K/uL   RBC 3.65 (L) 3.87 - 5.11 MIL/uL   Hemoglobin 10.2 (L) 12.0 - 15.0 g/dL   HCT 32.7 (L) 36.0 - 46.0 %   MCV 89.6 80.0 - 100.0 fL   MCH 27.9 26.0 - 34.0 pg   MCHC 31.2 30.0 - 36.0 g/dL   RDW 15.6 (H) 11.5 - 15.5 %   Platelets 298 150 - 400 K/uL   nRBC 0.1 0.0 - 0.2 %  Magnesium     Status: Abnormal   Collection Time: 07/10/18  4:20 AM  Result Value Ref Range   Magnesium 1.5 (L) 1.7 - 2.4 mg/dL  Glucose, capillary     Status:  Abnormal   Collection Time: 07/10/18  7:52 AM  Result Value Ref Range   Glucose-Capillary 189 (H) 70 - 99 mg/dL    Studies/Results: No results found.    Assessment: UTI  Bilateral pneumonia  Recent hematemesis with no further signs of hematemesis  Rectal bleeding probably from stercoral colitis  Plan:   Continue antibiotics. No plans for endoscopy at this time given her clinical situation. Would only consider endoscopy if therapeutic intervention were needed. Given her medical problems currently I think she would pose a high risk for sedation for endoscopic procedures. We will sign off. Call us if needed.    Cassell Clement 07/10/2018, 11:32 AM  Pager: [phone] If no answer or after 5 PM call [phone]

## 2018-07-10 NOTE — Care Management Important Message (Signed)
Important Message  Patient Details  Name: Deshae Dickison MRN: [id number] Date of Birth: 03-Dec-1933   Medicare Important Message Given:  Yes    Kerin Salen 07/10/2018, 9:53 AMImportant Message  Patient Details  Name: Aimy Sweeting MRN: [id number] Date of Birth: 06-10-1933   Medicare Important Message Given:  Yes    Kerin Salen 07/10/2018, 9:53 AM

## 2018-07-10 NOTE — Progress Notes (Signed)
CSW followed up with bed offers. The daughter plans to visit the facilities today.  CSW will continue to assist with SNF placement.  Kathrin Greathouse, Marlinda Mike, MSW Clinical Social Worker  [phone] 07/10/2018  12:47 PM

## 2018-07-10 NOTE — Progress Notes (Signed)
CRITICAL VALUE ALERT  Critical Value: Potassium = 2  Date & Time Notied: 07/10/18 0510   Provider Notified: Josephine Cables  Orders Received/Actions taken: Potassium ordered

## 2018-07-11 ENCOUNTER — Inpatient Hospital Stay (HOSPITAL_COMMUNITY): Payer: Medicare Other

## 2018-07-11 LAB — LACTIC ACID, PLASMA
Lactic Acid, Venous: 3.1 mmol/L (ref 0.5–1.9)
Lactic Acid, Venous: 2.1 mmol/L (ref 0.5–1.9)
Lactic Acid, Venous: 3.9 mmol/L (ref 0.5–1.9)

## 2018-07-11 LAB — GLUCOSE, CAPILLARY
Glucose-Capillary: 217 mg/dL — ABNORMAL HIGH (ref 70–99)
Glucose-Capillary: 253 mg/dL — ABNORMAL HIGH (ref 70–99)
Glucose-Capillary: 197 mg/dL — ABNORMAL HIGH (ref 70–99)
Glucose-Capillary: 272 mg/dL — ABNORMAL HIGH (ref 70–99)

## 2018-07-11 LAB — CBC WITH DIFFERENTIAL/PLATELET
ABS IMMATURE GRANULOCYTES: 0.62 10*3/uL — AB (ref 0.00–0.07)
Basophils Absolute: 0.1 10*3/uL (ref 0.0–0.1)
Basophils Relative: 0 %
EOS PCT: 1 %
Eosinophils Absolute: 0.1 10*3/uL (ref 0.0–0.5)
HCT: 35.9 % — ABNORMAL LOW (ref 36.0–46.0)
Hemoglobin: 11.1 g/dL — ABNORMAL LOW (ref 12.0–15.0)
Immature Granulocytes: 4 %
Lymphocytes Relative: 9 %
Lymphs Abs: 1.3 10*3/uL (ref 0.7–4.0)
MCH: 28 pg (ref 26.0–34.0)
MCHC: 30.9 g/dL (ref 30.0–36.0)
MCV: 90.4 fL (ref 80.0–100.0)
MONO ABS: 1.1 10*3/uL — AB (ref 0.1–1.0)
Monocytes Relative: 7 %
Neutro Abs: 11.6 10*3/uL — ABNORMAL HIGH (ref 1.7–7.7)
Neutrophils Relative %: 79 %
Platelets: 372 10*3/uL (ref 150–400)
RBC: 3.97 MIL/uL (ref 3.87–5.11)
RDW: 15.5 % (ref 11.5–15.5)
WBC: 14.7 10*3/uL — ABNORMAL HIGH (ref 4.0–10.5)
nRBC: 0 % (ref 0.0–0.2)

## 2018-07-11 LAB — BASIC METABOLIC PANEL
Anion gap: 14 (ref 5–15)
BUN: 15 mg/dL (ref 8–23)
CO2: 29 mmol/L (ref 22–32)
Calcium: 8.3 mg/dL — ABNORMAL LOW (ref 8.9–10.3)
Chloride: 98 mmol/L (ref 98–111)
Creatinine, Ser: 1.17 mg/dL — ABNORMAL HIGH (ref 0.44–1.00)
GFR calc Af Amer: 50 mL/min — ABNORMAL LOW (ref >60)
GFR, EST NON AFRICAN AMERICAN: 43 mL/min — AB (ref >60)
Glucose, Bld: 221 mg/dL — ABNORMAL HIGH (ref 70–99)
POTASSIUM: 2.3 mmol/L — AB (ref 3.5–5.1)
Sodium: 141 mmol/L (ref 135–145)

## 2018-07-11 LAB — HEMOGLOBIN A1C
Hgb A1c MFr Bld: 8.1 % — ABNORMAL HIGH (ref 4.8–5.6)
Mean Plasma Glucose: 185.77 mg/dL

## 2018-07-11 LAB — MAGNESIUM: Magnesium: 1.4 mg/dL — ABNORMAL LOW (ref 1.7–2.4)

## 2018-07-11 LAB — PROCALCITONIN: PROCALCITONIN: 0.15 ng/mL

## 2018-07-11 MED ORDER — MAGNESIUM SULFATE 4 GM/100ML IV SOLN
4.0000 g | Freq: Once | INTRAVENOUS | Status: AC
  Administered 2018-07-11: 4 g via INTRAVENOUS
  Filled 2018-07-11: qty 100

## 2018-07-11 MED ORDER — VANCOMYCIN HCL 10 G IV SOLR
2000.0000 mg | Freq: Once | INTRAVENOUS | Status: AC
  Administered 2018-07-11: 2000 mg via INTRAVENOUS
  Filled 2018-07-11: qty 2000

## 2018-07-11 MED ORDER — PIPERACILLIN-TAZOBACTAM 3.375 G IVPB
3.3750 g | Freq: Three times a day (TID) | INTRAVENOUS | Status: DC
  Administered 2018-07-11 – 2018-07-17 (×17): 3.375 g via INTRAVENOUS
  Filled 2018-07-11 (×18): qty 50

## 2018-07-11 MED ORDER — VANCOMYCIN HCL 10 G IV SOLR
1250.0000 mg | INTRAVENOUS | Status: DC
  Administered 2018-07-13: 1250 mg via INTRAVENOUS
  Filled 2018-07-11: qty 1250

## 2018-07-11 MED ORDER — SODIUM CHLORIDE 0.45 % IV SOLN
INTRAVENOUS | Status: DC
  Administered 2018-07-11 – 2018-07-14 (×4): via INTRAVENOUS

## 2018-07-11 MED ORDER — POTASSIUM CHLORIDE CRYS ER 20 MEQ PO TBCR
40.0000 meq | EXTENDED_RELEASE_TABLET | Freq: Three times a day (TID) | ORAL | Status: AC
  Administered 2018-07-11 (×3): 40 meq via ORAL
  Filled 2018-07-11 (×3): qty 2

## 2018-07-11 MED ORDER — ENOXAPARIN SODIUM 40 MG/0.4ML ~~LOC~~ SOLN
40.0000 mg | SUBCUTANEOUS | Status: DC
  Administered 2018-07-11: 40 mg via SUBCUTANEOUS
  Filled 2018-07-11: qty 0.4

## 2018-07-11 NOTE — Progress Notes (Signed)
Pharmacy Antibiotic Note  Danielle Dennis is a 85 y.o. female admitted on 07/07/2018 as transfer from [facility] with GI Bleed.  Pharmacy has been consulted for vancomycin and zosyn dosing for sepsis/PNA.  Pt on D#5 Invanz for ESBL E coli UTI (did get some Invanz at OSH). 2/20 CXR: mild interstitial edema vs infection.  WBC 14.7, Scr 1.17, PCT 0.15, lactate 3.1.   Plan: Zosyn 3.375g IV q8h (4 hour infusion).  Vancomycin 2 gm loading dose Vancomycin 1250 mg IV Q 48 hrs. Goal AUC 400-550. Expected AUC: 453, Css max 34.8, Css min 9.1 SCr used: 1.17, IBW/TBW Vd 0.5 Daily SCr while on both vanc and zosyn F/u WBC, temp, culture data vanc levels as needed  Height: 5\' 2"  (157.5 cm) Weight: 211 lb 10.3 oz (96 kg) IBW/kg (Calculated) : 50.1  Temp (24hrs), Avg:98.1 F (36.7 C), Min:97.8 F (36.6 C), Max:98.3 F (36.8 C)  Recent Labs  Lab 07/07/18 1347 07/08/18 0448 07/10/18 0420 07/11/18 0726 07/11/18 1542  WBC 16.1* 14.5* 14.2* 14.7*  --   CREATININE 1.13* 1.00 0.95 1.17*  --   LATICACIDVEN  --   --   --   --  3.1*    Estimated Creatinine Clearance: 40.1 mL/min (A) (by C-G formula based on SCr of 1.17 mg/dL (H)).    Allergies  Allergen Reactions  . Codeine Other (See Comments)    Per MAR  Antimicrobials this admission:  2/16 Invanz>>2/20 2/20 vanc>> 2/20 ZEI>> Dose adjustments this admission:   Microbiology results:  2/8 uxs: ESBL Ecoli and pan sens E faecium (from [place]?)  2/16 BCx x2: NGTD 2/16 UCx: 30K yeast FINAL 2/18 MRSA PCR: pos  Thank you for allowing pharmacy to be a part of this patient's care.  Eudelia Bunch, Pharm.D [phone] 07/11/2018 5:58 PM

## 2018-07-11 NOTE — Progress Notes (Signed)
CRITICAL VALUE ALERT  Critical Value: Lactic Acid 3.1  Date & Time Notied:  07/11/2018 1640  Provider Notified: Dr. Nevada Crane  Orders Received/Actions taken: orders written

## 2018-07-11 NOTE — Progress Notes (Signed)
CRITICAL VALUE ALERT  Critical Value:  Lactic Acid 2.1  Date & Time Notied:  07/11/2018 @ 1930  Provider Notified: Bodenheimer NP   Orders Received/Actions taken: No new orders received. Will continue to monitor patient

## 2018-07-11 NOTE — Progress Notes (Signed)
PROGRESS NOTE  Danielle Dennis QVZ:[id number] DOB: 16-Apr-1934 DOA: 07/07/2018 PCP: Emmaline Kluver, MD  HPI/Recap of past 24 hours: 85 y.o.femalewith medical history significant ofHFpEF, COPD, T2DM, HTN, Hypothyroidism, ESBL e coli bacteremia and multiple other medical problems who presented to [place] [facility] initially with concern for the "foley not draining right all day" on 07/03/18. Pt also noted generalized weakness and difficulty ambulating. Urine cx from 06/29/2018 showed ESBL E. Coli and pan sensitive E faecium. Pt follows with Dr. Emi Holes (urology, who had started pt on ceftin and then subsequently changed to augmentin). Initially plan was for observation and d/c to SNF on 2/13, but she had severe hypokalemia to 2.1 and acute kidney injury. On 2/14, plan for d/c to SNF, but pt had large bowel movement with blood. Pt was monitored another night and did not have any additional episdoes for blood in stool or black stool, but then started having episodes of coffee-ground low volume emesis.  GI was consulted.  No further signs of hematemesis and rectal bleeding probably from stercoral colitis.  GI signed off on 07/10/2018.  07/11/2018: Patient seen and examined at bedside.  No acute events overnight.  Oxygen demand has increased.  Admits to intermittent nonproductive cough.  Patient reports on oxygen 2 L as needed at baseline.  Not requiring 4 L to maintain O2 saturation greater than 90%.  Will obtain procalcitonin, lactic acid, and chest x-ray.    Assessment/Plan: Principal Problem:   GI bleed Active Problems:   UTI due to extended-spectrum beta lactamase (ESBL) producing Escherichia coli   (HFpEF) heart failure with preserved ejection fraction (HCC)   T2DM (type 2 diabetes mellitus) ([place])   Resolved upper GI bleed:  GI consulted and signed off on 07/10/2018 -PPI BID -GI consulted. No longer plans for endoscopy as pt seems to be improved -Hemoglobin stable at 11.1 from 10.2  yesterday -No sign of overt bleeding -Resume pharmacological DVT prophylaxis  Urinary Tract Infection Indwelling Foley Catheter: -Of note, she recently had ESBL bacteremia in January, treated with 7 days ertapenem. -Urine cx on 2/8 and on 2/11 positive for ESBL e. Coli and enterococcus faecium -Augmentin per Dr. Emi Holes (urology) at OSH. Started on ertapenem at presentation at OSH, but transitioned back to augmentin.  Asymptomatic candiduria in the setting of indwelling Foley catheter Urine culture grew greater than 30,000 yeast, suspect contaminant  Bilateral HCAP Continue IV antibiotics Independently reviewed chest imaging which revealed bilateral lower lobe infiltrates Repeat chest x-ray on 07/11/2018 Obtain procalcitonin and lactic acid  Acute on chronic hypoxic respiratory failure suspect secondary to bilateral HCAP versus others Reports on 2 L of oxygen as needed at baseline Now requiring 4 L to maintain O2 saturation greater than 90% Maintain O2 saturation 92% or greater Close monitoring of vital signs on telemetry  Persistent hypokalemia Potassium 2.3 Repleted with KCl p.o. 40 mEq x 3 doses  Hypomagnesemia Magnesium 1.4 Repleted with IV magnesium 4 g once  MRSA positive Suspect from colonization Blood culture negative to date x2  Type 2 diabetes uncontrolled with hyperglycemia Obtain obtain hemoglobin A1c Start insulin sliding scale Hold Januvia  Abdominal Discomfort suspect secondary to possible stercoral colitis: -large stool on CT abd -Required manual disimpaction. Continue cathartics as tolerated -Reports having bowel movements  Acute Kidney Injury:  -Baseline creatinine 0.8-1 per OSH records, at presentation to [facility] was 2.1 -Improved -She's on bumex/metalazone, currently being held- will need close follow up when resumed -Repeat bmet in AM  Resolved acute diabetic encephalopathy  Generalized Weakness and Deconditioning: PT  assessed and recommended SNF Dietician  Hydronephrosis: CT urogram on 06/29/2018 showed mild to moderate R hydroureteronehrosis which terminates at the junction of middle and distal thirds of the R ureter. No urinary tract calculi. The possibility of a ureteral stricture or urotheial lesion in the ureter should be considered. Urologic consultation is recommended. These findings could be better evaluated with follow up hematuria protocal CT scan if clinically appropriate. -Seems stable at present  Hypertension:holdingamlodipine, hydralazinefor now -BP stable and controlled at present  Hypothyroidism: Continue synthroid as tolerated  GERD: PPI as noted above  Chronic Diastolic HF:She does have bilateral LE edema.Held diureticsprior to CT study with contrast -Seems euvolemic at this time -Renal function stable. Will resume Bumex per home regimen  Hormone Replacement Therapy: currently holding estradiol. Consider discontinuing given risk with continued use at her age.  DVT prophylaxis:  Subcu Lovenox daily Code Status: DNR Family Communication:  None at bedside Disposition Plan:  SNF when hemodynamically stable and a bed is available  Consultants:   GI    Antimicrobials:             Anti-infectives (From admission, onward)     Start        Dose/Rate  Route  Frequency  Ordered  Stop     07/07/18 1600    ertapenem (INVANZ) 1,000 mg in sodium chloride 0.9 % 100 mL IVPB       1 g  200 mL/hr over 30 Minutes  Intravenous  Every 24 hours  07/07/18 1431            Objective: Vitals:   07/11/18 0618 07/11/18 0922 07/11/18 1211 07/11/18 1228  BP: 130/71  (!) 145/79   Pulse: 80  92   Resp: 18 16 20    Temp: 97.8 F (36.6 C)  98.3 F (36.8 C)   TempSrc: Oral  Oral   SpO2: 91% 92% (!) 88% 90%  Weight:      Height:        Intake/Output Summary (Last 24 hours) at 07/11/2018 1518 Last data filed at 07/11/2018 1231 Gross per 24 hour    Intake 340 ml  Output 1950 ml  Net -1610 ml   Filed Weights   07/08/18 0500 07/09/18 0500 07/10/18 0500  Weight: 98.2 kg 96.4 kg 96 kg    Exam:  . General: 85 y.o. year-old female well-developed well-nourished in no acute distress.  Alert and interactive. . Cardiovascular: Regular rate and rhythm with no rubs or gallops.  No JVD or thyromegaly . Respiratory: Mild rales at bases with no wheezes.  Poor inspiratory effort. . Abdomen: Soft mildly tender diffusely with normal bowel sounds x4 quadrants. . Musculoskeletal: Trace lower extremity edema. 2/4 pulses in all 4 extremities. Marland Kitchen Psychiatry: Mood is appropriate for condition and setting   Data Reviewed: CBC: Recent Labs  Lab 07/07/18 1347  07/08/18 0448  07/08/18 2112 07/09/18 0542 07/09/18 1320 07/10/18 0420 07/11/18 0726  WBC 16.1*  --  14.5*  --   --   --   --  14.2* 14.7*  NEUTROABS  --   --   --   --   --   --   --   --  11.6*  HGB 10.5*   < > 9.4*   < > 9.3* 8.9* 8.9* 10.2* 11.1*  HCT 33.7*   < > 30.5*   < > 30.1* 29.1* 28.7* 32.7* 35.9*  MCV 88.7  --  90.0  --   --   --   --  89.6 90.4  PLT 267  --  258  --   --   --   --  298 372   < > = values in this interval not displayed.   Basic Metabolic Panel: Recent Labs  Lab 07/07/18 1347 07/08/18 0448 07/10/18 0420 07/11/18 0726  NA 137 135 138 141  K 3.4* 3.4* 2.0* 2.3*  CL 101 103 98 98  CO2 27 23 28 29   GLUCOSE 217* 258* 208* 221*  BUN 41* 27* 12 15  CREATININE 1.13* 1.00 0.95 1.17*  CALCIUM 8.5* 8.5* 8.4* 8.3*  MG  --   --  1.5* 1.4*   GFR: Estimated Creatinine Clearance: 40.1 mL/min (A) (by C-G formula based on SCr of 1.17 mg/dL (H)). Liver Function Tests: Recent Labs  Lab 07/07/18 1347 07/08/18 0448 07/10/18 0420  AST 18 17 21   ALT 16 16 23   ALKPHOS 72 67 75  BILITOT 0.6 0.5 0.5  PROT 5.8* 5.5* 5.9*  ALBUMIN 2.6* 2.4* 2.3*   No results for input(s): LIPASE, AMYLASE in the last 168 hours. Recent Labs  Lab 07/07/18 1423  AMMONIA 13    Coagulation Profile: No results for input(s): INR, PROTIME in the last 168 hours. Cardiac Enzymes: No results for input(s): CKTOTAL, CKMB, CKMBINDEX, TROPONINI in the last 168 hours. BNP (last 3 results) No results for input(s): PROBNP in the last 8760 hours. HbA1C: No results for input(s): HGBA1C in the last 72 hours. CBG: Recent Labs  Lab 07/10/18 1207 07/10/18 1751 07/10/18 2100 07/11/18 0732 07/11/18 1149  GLUCAP 224* 200* 171* 217* 253*   Lipid Profile: No results for input(s): CHOL, HDL, LDLCALC, TRIG, CHOLHDL, LDLDIRECT in the last 72 hours. Thyroid Function Tests: No results for input(s): TSH, T4TOTAL, FREET4, T3FREE, THYROIDAB in the last 72 hours. Anemia Panel: No results for input(s): VITAMINB12, FOLATE, FERRITIN, TIBC, IRON, RETICCTPCT in the last 72 hours. Urine analysis:    Component Value Date/Time   COLORURINE YELLOW 07/07/2018 1448   APPEARANCEUR CLOUDY (A) 07/07/2018 1448   LABSPEC 1.012 07/07/2018 1448   PHURINE 5.0 07/07/2018 1448   GLUCOSEU NEGATIVE 07/07/2018 1448   HGBUR SMALL (A) 07/07/2018 1448   BILIRUBINUR NEGATIVE 07/07/2018 1448   KETONESUR NEGATIVE 07/07/2018 1448   PROTEINUR NEGATIVE 07/07/2018 1448   NITRITE NEGATIVE 07/07/2018 1448   LEUKOCYTESUR LARGE (A) 07/07/2018 1448   Sepsis Labs: @LABRCNTIP (procalcitonin:4,lacticidven:4)  ) Recent Results (from the past 240 hour(s))  Culture, Urine     Status: Abnormal   Collection Time: 07/07/18  2:48 PM  Result Value Ref Range Status   Specimen Description   Final    URINE, RANDOM Performed at [facility], [place] [address] [place], [place] [address]    Special Requests   Final    NONE Performed at [facility], [place] [address], [place], [place] [address]    Culture 30,000 COLONIES/mL YEAST (A)  Final   Report Status 07/09/2018 FINAL  Final  Culture, blood (routine x 2)     Status: None (Preliminary result)   Collection Time: 07/07/18  3:40 PM   Result Value Ref Range Status   Specimen Description   Final    RIGHT ANTECUBITAL Performed at [place] [address], [place], [place] [address]    Special Requests   Final    BOTTLES DRAWN AEROBIC AND ANAEROBIC Blood Culture adequate volume Performed at [facility] [address] [place], [place] [address]    Culture   Final  NO GROWTH 4 DAYS Performed at [facility], [place] [address], [place], [place] [address]    Report Status PENDING  Incomplete  Culture, blood (routine x 2)     Status: None (Preliminary result)   Collection Time: 07/07/18  3:50 PM  Result Value Ref Range Status   Specimen Description   Final    BLOOD RIGHT ARM Performed at [facility] [address] [place], [place] [address]    Special Requests   Final    BOTTLES DRAWN AEROBIC ONLY Blood Culture adequate volume Performed at [facility] [address], [place], [place] [address]    Culture   Final    NO GROWTH 4 DAYS Performed at [facility], [place] [address], [place], [place] [address]    Report Status PENDING  Incomplete  MRSA PCR Screening     Status: Abnormal   Collection Time: 07/09/18 11:43 AM  Result Value Ref Range Status   MRSA by PCR POSITIVE (A) NEGATIVE Final    Comment:        The GeneXpert MRSA Assay (FDA approved for NASAL specimens only), is one component of a comprehensive MRSA colonization surveillance program. It is not intended to diagnose MRSA infection nor to guide or monitor treatment for MRSA infections. RESULT CALLED TO, READ BACK BY AND VERIFIED WITH: C.ELLIS AT 1544 ON 07/09/18 BY N.THOMPSON Performed at [facility], [place] [address], [place], [place] [address]       Studies: No results found.  Scheduled Meds: . arformoterol  15 mcg Nebulization BID  . budesonide (PULMICORT) nebulizer solution  0.25 mg Nebulization BID  . bumetanide  6 mg  Oral QAC breakfast   And  . bumetanide  4 mg Oral q1800  . Chlorhexidine Gluconate Cloth  6 each Topical Q0600  . docusate sodium  100 mg Oral Daily  . feeding supplement  1 Container Oral TID BM  . insulin aspart  0-5 Units Subcutaneous QHS  . insulin aspart  0-9 Units Subcutaneous TID WC  . levothyroxine  125 mcg Oral Q0600  . mouth rinse  15 mL Mouth Rinse BID  . mupirocin ointment  1 application Nasal BID  . nystatin   Topical TID  . pantoprazole (PROTONIX) IV  40 mg Intravenous Q12H  . polyethylene glycol  17 g Oral Daily  . polyvinyl alcohol  1 drop Both Eyes QHS  . potassium chloride  40 mEq Oral TID    Continuous Infusions: . sodium chloride 250 mL (07/09/18 1702)  . ertapenem 1,000 mg (07/10/18 1627)     LOS: 4 days     Kayleen Memos, MD [facility] Pager [phone]  If 7PM-7AM, please contact night-coverage [email or web address] Password [facility] 07/11/2018, 3:18 PM

## 2018-07-11 NOTE — Progress Notes (Signed)
Physical Therapy Treatment Patient Details Name: Danielle Dennis MRN: [id number] DOB: May 09, 1934 Today's Date: 07/11/2018    History of Present Illness 85 year old Caucasian female from  assisted living with past medical history of hypertension, type 2 diabetes mellitus, COPD, hyponatremia hypothyroidism, dCHF who was adm  to ED on 07/03/2018  at [facility] d/t issues with foly catheter not draining & c/o  generalized weakness with difficulty amb, and blood in stool, [facility]ferred to [facility] on 2/16 d/t no GI service at [facility]    PT Comments    Pt is progressing slowly, she continues to require require maximum encouragement, c/o pain with any mobility and demonstrates lack of motivation to perform any physical task; PT may benefit from Palliative Care consult to determine goals of care; continue PT POC for now  Follow Up Recommendations  SNF     Equipment Recommendations  None recommended by PT    Recommendations for Other Services       Precautions / Restrictions Precautions Precautions: Fall Restrictions Weight Bearing Restrictions: No    Mobility  Bed Mobility Overal bed mobility: Needs Assistance Bed Mobility: Rolling;Sidelying to Sit;Sit to Supine Rolling: Max assist;+2 for physical assistance;+2 for safety/equipment Sidelying to sit: Max assist;+2 for physical assistance;+2 for safety/equipment   Sit to supine: +2 for physical assistance;+2 for safety/equipment;Total assist   General bed mobility comments: pt requires multi-modal cues and max encouragement to mobilize; requires assist with LEs and trunk in both directions  Transfers                    Ambulation/Gait                 Stairs             Wheelchair Mobility    Modified Rankin (Stroke Patients Only)       Balance     Sitting balance-Leahy Scale: Zero Sitting balance - Comments: mod to max assist to maintain midline Postural control: Posterior lean                                   Cognition Arousal/Alertness: Awake/alert Behavior During Therapy: Flat affect Overall Cognitive Status: No family/caregiver present to determine baseline cognitive functioning Area of Impairment: Memory;Following commands;Problem solving                       Following Commands: Follows one step commands with increased time;Follows multi-step commands inconsistently     Problem Solving: Slow processing;Decreased initiation;Difficulty sequencing;Requires verbal cues;Requires tactile cues        Exercises      General Comments        Pertinent Vitals/Pain Pain Assessment: Faces Faces Pain Scale: Hurts worst Pain Location: back Pain Descriptors / Indicators: Grimacing;Guarding;Moaning Pain Intervention(s): Monitored during session;Limited activity within patient's tolerance    Home Living                      Prior Function            PT Goals (current goals can now be found in the care plan section) Acute Rehab PT Goals Patient Stated Goal: get stronger and feel better PT Goal Formulation: With patient Time For Goal Achievement: 07/22/18 Potential to Achieve Goals: Good Progress towards PT goals: Progressing toward goals    Frequency    Min 2X/week      PT Plan  Current plan remains appropriate    Co-evaluation PT/OT/SLP Co-Evaluation/Treatment: Yes Reason for Co-Treatment: For patient/therapist safety PT goals addressed during session: Mobility/safety with mobility OT goals addressed during session: ADL's and self-care      AM-PAC PT "6 Clicks" Mobility   Outcome Measure  Help needed turning from your back to your side while in a flat bed without using bedrails?: Total Help needed moving from lying on your back to sitting on the side of a flat bed without using bedrails?: Total Help needed moving to and from a bed to a chair (including a wheelchair)?: Total Help needed standing up from a chair using  your arms (e.g., wheelchair or bedside chair)?: Total Help needed to walk in hospital room?: Total Help needed climbing 3-5 steps with a railing? : Total 6 Click Score: 6    End of Session   Activity Tolerance: Patient limited by fatigue;Patient limited by pain Patient left: in bed;with bed alarm set;with call bell/phone within reach   PT Visit Diagnosis: Muscle weakness (generalized) (M62.81)     Time: 2080-2233 PT Time Calculation (min) (ACUTE ONLY): 23 min  Charges:  $Therapeutic Activity: 8-22 mins                     Kenyon Ana, PT  Pager: [phone] Acute Rehab Dept [facility]): [phone]   07/11/2018    [facility] 07/11/2018, 3:12 PM

## 2018-07-11 NOTE — Progress Notes (Signed)
CRITICAL VALUE ALERT  Critical Value: Potassium 2.3  Date & Time Notied:  07/11/2018 0815  Provider Notified: Dr. Nevada Crane  Orders Received/Actions taken: Order written

## 2018-07-11 NOTE — Progress Notes (Signed)
Occupational Therapy Treatment Patient Details Name: Danielle Dennis MRN: [id number] DOB: [birth year] Today's Date: 07/11/2018    History of present illness 85 year old Caucasian female from  assisted living with past medical history of hypertension, type 2 diabetes mellitus, COPD, hyponatremia hypothyroidism, dCHF who was adm  to ED on 07/03/2018  at [facility] d/t issues with foly catheter not draining & c/o  generalized weakness with difficulty amb, and blood in stool, [facility]ferred to [facility] on 2/16 d/t no GI service at [facility]   OT comments  Pt not progressing towards OT goals this session. INcreased confusion, limited by pain. Pt was able to sit EOB with max A (strong posterior lean) for grooming activities but unsafe for OOB at this time. Encouraged HOB elevated for lung health, and increasing bed level activity to patient. OT will continue to follow current plan remains appropriate  I acknowledge that this patient is a DNR, but a Palliative consult would maybe be beneficial for her and family for chronic medical management.    Follow Up Recommendations  SNF    Equipment Recommendations  None recommended by OT    Recommendations for Other Services Other (comment)(Palliative Medicine)    Precautions / Restrictions Precautions Precautions: Fall Restrictions Weight Bearing Restrictions: No       Mobility Bed Mobility Overal bed mobility: Needs Assistance Bed Mobility: Rolling;Sidelying to Sit;Sit to Supine Rolling: Max assist;+2 for physical assistance;+2 for safety/equipment Sidelying to sit: Max assist;+2 for physical assistance;+2 for safety/equipment   Sit to supine: +2 for physical assistance;+2 for safety/equipment;Total assist   General bed mobility comments: pt requires multi-modal cues and max encouragement to mobilize; requires assist with LEs and trunk in both directions  Transfers                 General transfer comment: unable    Balance  Overall balance assessment: Needs assistance Sitting-balance support: Bilateral upper extremity supported;Single extremity supported;Feet supported Sitting balance-Leahy Scale: Zero Sitting balance - Comments: mod to max assist to maintain midline Postural control: Posterior lean                                 ADL either performed or assessed with clinical judgement   ADL Overall ADL's : Needs assistance/impaired     Grooming: Wash/dry face;Oral care;Set up;Sitting Grooming Details (indicate cue type and reason): max A for supported sitting, Pt able to bring toothbrush to mouth, and also to wash face                             Functional mobility during ADLs: (unsafe at this time)       Vision       Perception     Praxis      Cognition Arousal/Alertness: Awake/alert Behavior During Therapy: Flat affect Overall Cognitive Status: No family/caregiver present to determine baseline cognitive functioning Area of Impairment: Memory;Following commands;Problem solving                       Following Commands: Follows one step commands with increased time;Follows multi-step commands inconsistently     Problem Solving: Slow processing;Decreased initiation;Difficulty sequencing;Requires verbal cues;Requires tactile cues General Comments: unable to talk about PLOF at all, unable to choose from examples either        Exercises     Shoulder Instructions       General  Comments      Pertinent Vitals/ Pain       Pain Assessment: Faces Faces Pain Scale: Hurts worst Pain Location: back Pain Descriptors / Indicators: Grimacing;Guarding;Moaning Pain Intervention(s): Monitored during session;Repositioned  Home Living                                          Prior Functioning/Environment              Frequency  Min 2X/week        Progress Toward Goals  OT Goals(current goals can now be found in the care plan  section)  Progress towards OT goals: Not progressing toward goals - comment(increased confusion/pain)  Acute Rehab OT Goals Patient Stated Goal: get stronger and feel better OT Goal Formulation: With patient Time For Goal Achievement: 07/22/18 Potential to Achieve Goals: [place] Discharge plan remains appropriate;Frequency remains appropriate    Co-evaluation    PT/OT/SLP Co-Evaluation/Treatment: Yes Reason for Co-Treatment: For patient/therapist safety PT goals addressed during session: Mobility/safety with mobility OT goals addressed during session: ADL's and self-care      AM-PAC OT "6 Clicks" Daily Activity     Outcome Measure   Help from another person eating meals?: A Little Help from another person taking care of personal grooming?: A Little Help from another person toileting, which includes using toliet, bedpan, or urinal?: Total Help from another person bathing (including washing, rinsing, drying)?: A Lot Help from another person to put on and taking off regular upper body clothing?: A Lot Help from another person to put on and taking off regular lower body clothing?: A Lot 6 Click Score: 13    End of Session Equipment Utilized During Treatment: Oxygen  OT Visit Diagnosis: Unsteadiness on feet (R26.81);Muscle weakness (generalized) (M62.81)   Activity Tolerance Patient limited by pain   Patient Left in bed;with call bell/phone within reach;with bed alarm set   Nurse Communication Mobility status        Time: 0347-4259 OT Time Calculation (min): 23 min  Charges: OT General Charges $OT Visit: 1 Visit OT Treatments $Self Care/Home Management : 8-22 mins  Hulda Humphrey OTR/L Acute Rehabilitation Services Pager: [phone] Office: [place] 07/11/2018, 4:22 PM

## 2018-07-12 ENCOUNTER — Inpatient Hospital Stay (HOSPITAL_COMMUNITY): Payer: Medicare Other

## 2018-07-12 LAB — CBC WITH DIFFERENTIAL/PLATELET
Abs Immature Granulocytes: 0.71 10*3/uL — ABNORMAL HIGH (ref 0.00–0.07)
BASOS PCT: 1 %
Basophils Absolute: 0.1 10*3/uL (ref 0.0–0.1)
Eosinophils Absolute: 0.2 10*3/uL (ref 0.0–0.5)
Eosinophils Relative: 1 %
HCT: 35.7 % — ABNORMAL LOW (ref 36.0–46.0)
Hemoglobin: 10.9 g/dL — ABNORMAL LOW (ref 12.0–15.0)
Immature Granulocytes: 5 %
Lymphocytes Relative: 12 %
Lymphs Abs: 1.9 10*3/uL (ref 0.7–4.0)
MCH: 27.7 pg (ref 26.0–34.0)
MCHC: 30.5 g/dL (ref 30.0–36.0)
MCV: 90.6 fL (ref 80.0–100.0)
Monocytes Absolute: 1.1 10*3/uL — ABNORMAL HIGH (ref 0.1–1.0)
Monocytes Relative: 7 %
Neutro Abs: 11.1 10*3/uL — ABNORMAL HIGH (ref 1.7–7.7)
Neutrophils Relative %: 74 %
PLATELETS: 403 10*3/uL — AB (ref 150–400)
RBC: 3.94 MIL/uL (ref 3.87–5.11)
RDW: 15.8 % — ABNORMAL HIGH (ref 11.5–15.5)
WBC: 15.1 10*3/uL — ABNORMAL HIGH (ref 4.0–10.5)
nRBC: 0.2 % (ref 0.0–0.2)

## 2018-07-12 LAB — LACTIC ACID, PLASMA
Lactic Acid, Venous: 1.9 mmol/L (ref 0.5–1.9)
Lactic Acid, Venous: 4 mmol/L (ref 0.5–1.9)
Lactic Acid, Venous: 4.4 mmol/L (ref 0.5–1.9)
Lactic Acid, Venous: 3.1 mmol/L (ref 0.5–1.9)

## 2018-07-12 LAB — GLUCOSE, CAPILLARY
GLUCOSE-CAPILLARY: 167 mg/dL — AB (ref 70–99)
Glucose-Capillary: 163 mg/dL — ABNORMAL HIGH (ref 70–99)
Glucose-Capillary: 212 mg/dL — ABNORMAL HIGH (ref 70–99)
Glucose-Capillary: 160 mg/dL — ABNORMAL HIGH (ref 70–99)

## 2018-07-12 LAB — APTT: aPTT: 105 seconds — ABNORMAL HIGH (ref 24–36)

## 2018-07-12 LAB — CULTURE, BLOOD (ROUTINE X 2)
CULTURE: NO GROWTH
Culture: NO GROWTH
Special Requests: ADEQUATE
Special Requests: ADEQUATE

## 2018-07-12 LAB — COMPREHENSIVE METABOLIC PANEL
ALT: 27 U/L (ref 0–44)
AST: 25 U/L (ref 15–41)
Albumin: 2.5 g/dL — ABNORMAL LOW (ref 3.5–5.0)
Alkaline Phosphatase: 79 U/L (ref 38–126)
Anion gap: 14 (ref 5–15)
BUN: 20 mg/dL (ref 8–23)
CO2: 27 mmol/L (ref 22–32)
Calcium: 7.8 mg/dL — ABNORMAL LOW (ref 8.9–10.3)
Chloride: 98 mmol/L (ref 98–111)
Creatinine, Ser: 1.21 mg/dL — ABNORMAL HIGH (ref 0.44–1.00)
GFR calc Af Amer: 48 mL/min — ABNORMAL LOW (ref >60)
GFR calc non Af Amer: 42 mL/min — ABNORMAL LOW (ref >60)
Glucose, Bld: 163 mg/dL — ABNORMAL HIGH (ref 70–99)
Potassium: 2.5 mmol/L — CL (ref 3.5–5.1)
Sodium: 139 mmol/L (ref 135–145)
Total Bilirubin: 0.5 mg/dL (ref 0.3–1.2)
Total Protein: 6 g/dL — ABNORMAL LOW (ref 6.5–8.1)

## 2018-07-12 LAB — SEDIMENTATION RATE: Sed Rate: 92 mm/hr — ABNORMAL HIGH (ref 0–22)

## 2018-07-12 LAB — PROTIME-INR
INR: 1.33
Prothrombin Time: 16.4 seconds — ABNORMAL HIGH (ref 11.4–15.2)

## 2018-07-12 LAB — C-REACTIVE PROTEIN: CRP: 3.4 mg/dL — ABNORMAL HIGH (ref <1.0)

## 2018-07-12 LAB — MAGNESIUM: Magnesium: 1.8 mg/dL (ref 1.7–2.4)

## 2018-07-12 LAB — PHOSPHORUS: Phosphorus: 3.3 mg/dL (ref 2.5–4.6)

## 2018-07-12 MED ORDER — FLUCONAZOLE IN SODIUM CHLORIDE 200-0.9 MG/100ML-% IV SOLN
200.0000 mg | INTRAVENOUS | Status: DC
  Administered 2018-07-12: 200 mg via INTRAVENOUS
  Filled 2018-07-12 (×2): qty 100

## 2018-07-12 MED ORDER — PANTOPRAZOLE SODIUM 40 MG PO TBEC
40.0000 mg | DELAYED_RELEASE_TABLET | Freq: Two times a day (BID) | ORAL | Status: DC
  Administered 2018-07-12 – 2018-07-19 (×14): 40 mg via ORAL
  Filled 2018-07-12 (×14): qty 1

## 2018-07-12 MED ORDER — HEPARIN (PORCINE) 25000 UT/250ML-% IV SOLN
1200.0000 [IU]/h | INTRAVENOUS | Status: DC
  Administered 2018-07-12: 1200 [IU]/h via INTRAVENOUS
  Filled 2018-07-12: qty 250

## 2018-07-12 MED ORDER — IOPAMIDOL (ISOVUE-370) INJECTION 76%
100.0000 mL | Freq: Once | INTRAVENOUS | Status: AC | PRN
  Administered 2018-07-12: 80 mL via INTRAVENOUS

## 2018-07-12 MED ORDER — DOCUSATE SODIUM 100 MG PO CAPS: 100.0000 mg | ORAL_CAPSULE | Freq: Every day | ORAL | Status: DC | PRN

## 2018-07-12 MED ORDER — SODIUM CHLORIDE (PF) 0.9 % IJ SOLN
INTRAMUSCULAR | Status: AC
  Filled 2018-07-12: qty 50

## 2018-07-12 MED ORDER — POTASSIUM CHLORIDE CRYS ER 20 MEQ PO TBCR
40.0000 meq | EXTENDED_RELEASE_TABLET | Freq: Three times a day (TID) | ORAL | Status: AC
  Administered 2018-07-12 (×3): 40 meq via ORAL
  Filled 2018-07-12 (×3): qty 2

## 2018-07-12 MED ORDER — LIDOCAINE 5 % EX PTCH
1.0000 | MEDICATED_PATCH | CUTANEOUS | Status: DC
  Administered 2018-07-12 – 2018-07-19 (×8): 1 via TRANSDERMAL
  Filled 2018-07-12 (×8): qty 1

## 2018-07-12 MED ORDER — HEPARIN BOLUS VIA INFUSION
4000.0000 [IU] | Freq: Once | INTRAVENOUS | Status: AC
  Administered 2018-07-12: 4000 [IU] via INTRAVENOUS
  Filled 2018-07-12: qty 4000

## 2018-07-12 MED ORDER — IOPAMIDOL (ISOVUE-370) INJECTION 76%
INTRAVENOUS | Status: AC
  Filled 2018-07-12: qty 100

## 2018-07-12 MED ORDER — POLYETHYLENE GLYCOL 3350 17 G PO PACK: 17.0000 g | PACK | Freq: Every day | ORAL | Status: DC | PRN

## 2018-07-12 NOTE — Progress Notes (Signed)
CRITICAL VALUE ALERT  Critical Value: potassium 2.5  Date & Time Notied: 07/12/18 @ 0535  Provider Notified: Bodenheimer NP  Orders Received/Actions taken: See new orders. Will continue to monitor patient

## 2018-07-12 NOTE — Progress Notes (Signed)
CRITICAL VALUE ALERT  Critical Value:  Lactic acid 4.0  Date & Time Notied:  05/12/2019 1:20 pm   Provider Notified:Dr. Nevada Crane  Orders Received/Actions taken: MD reviewing chart. IV fluids infusing as ordered.

## 2018-07-12 NOTE — Progress Notes (Signed)
CRITICAL VALUE ALERT  Critical Value:  4.4  Date & Time Notied:  07/12/2018 1458   Provider Notified: Dr. Nevada Crane via text  Orders Received/Actions taken:

## 2018-07-12 NOTE — Progress Notes (Signed)
CRITICAL VALUE ALERT  Critical Value: Lactic Acid 3.9  Date & Time Notied: 07/11/2018 @ 2300  Provider Notified: Bodenheimer NP   Orders Received/Actions taken: No new orders received. Will continue to monitor patient

## 2018-07-12 NOTE — Progress Notes (Signed)
CRITICAL VALUE ALERT  Critical Value: lactic acid 3.1  Date & Time Notied:  07/12/2018  1737  Provider Notified: Dr. Nevada Crane   Orders Received/Actions taken: new orders noted

## 2018-07-12 NOTE — Progress Notes (Addendum)
PROGRESS NOTE  Danielle Dennis XVQ:[id number] DOB: [birth year] DOA: 07/07/2018 PCP: Emmaline Kluver, MD  HPI/Recap of past 24 hours: 85 y.o.femalewith medical history significant ofHFpEF, COPD, T2DM, HTN, Hypothyroidism, ESBL e coli bacteremia and multiple other medical problems who presented to [facility] initially with concern for the "foley not draining right all day" on 07/03/18. Pt also noted generalized weakness and difficulty ambulating. Urine cx from 06/29/2018 showed ESBL E. Coli and pan sensitive E faecium. Pt follows with Dr. Emi Holes (urology, who had started pt on ceftin and then subsequently changed to augmentin). Initially plan was for observation and d/c to SNF on 2/13, but she had severe hypokalemia to 2.1 and acute kidney injury. On 2/14, plan for d/c to SNF, but pt had large bowel movement with blood. Pt was monitored another night and did not have any additional episdoes for blood in stool or black stool, but then started having episodes of coffee-ground low volume emesis.  GI was consulted.  No further signs of hematemesis and rectal bleeding probably from stercoral colitis.  GI signed off on 07/10/2018.  07/11/2018: Patient seen and examined at bedside.  No acute events overnight.  Oxygen demand has increased.  Admits to intermittent nonproductive cough.  Patient reports on oxygen 2 L as needed at baseline.  Not requiring 4 L to maintain O2 saturation greater than 90%.  Will obtain procalcitonin, lactic acid, and chest x-ray.  07/12/18: Seen and examined at her bedside.  This morning she is more alert and oriented x3.  She declines imaging.  Had a diarrheal stool this morning.  Abdomen is mildly distended but she denies any tenderness at this time.  Lactic acid is improving from 3.9 to 1.9.  UPDATE: Patient agreed to have imagings done. CT head essentially unremarkable. CTA positive for PE with mild R heart strain. Started on hep drip. Also discitis osteomylitis could not  be ruled out. Will obtain ESR CRP to correlate. C/w IV Vancomycin and Zosyn.    Assessment/Plan: Principal Problem:   GI bleed Active Problems:   UTI due to extended-spectrum beta lactamase (ESBL) producing Escherichia coli   (HFpEF) heart failure with preserved ejection fraction (HCC)   T2DM (type 2 diabetes mellitus) ([place])   Resolved upper GI bleed:  GI consulted and signed off on 07/10/2018 -PPI BID -GI consulted. No longer plans for endoscopy as pt seems to be improved -No sign of overt bleeding -No recurrence of GI bleed noted in the last 48 hours.  R Pulmonary embolism, clinically undetermined if chronic Positive CTA PE independently reviewed Started on hep drip, dosed by pharmacy Maintain O2 sat >92%  Candiduria in the setting of indwelling Foley catheter Reports suprapubic pain Has indwelling foley due to urinary retention Urine culture grew greater than 30,000 yeast May require changing foley cath Treat w diflucan IV 200 mg daily x 14 days  Bilateral HCAP Continue IV antibiotics IV vanc and Zosyn Independently reviewed chest imaging which revealed bilateral lower lobe infiltrates Repeat chest x-ray on 07/11/2018 Low procalcitonin but elevated lactic acid 3.9  Lactic acidosis 2/2 R sided PE Lactic acid peaked at >4.0 Now trending down  Suspected L2-L3 discitis/osteomyelitis Obtain ESR CRP to correlate C/w empiric abx Repeat blood cx x2 peripherally Last blood cx x2 negative to date ID will be consulted if suspicion for osteomyelitis is confirmed thru ESR CRP or if positive Blood Cx  Leukocytosis, multifactorial Reactive in the setting of PE vs HCAP Vs suspected L2-L3 osteomyelitis/discitis Elevated lactic acid  WBC  15k from 14k Continue IV Zosyn and IV vanc empirically Repeat CBC in the morning  Acute on chronic hypoxic respiratory failure suspect secondary to PE vs bilateral HCAP  Reports on 2 L of oxygen as needed at baseline Now requiring 4 L to  maintain O2 saturation greater than 90% Maintain O2 saturation 92% or greater Close monitoring of vital signs on telemetry Treat PE and HCAP  Urinary Tract Infection Indwelling Foley Catheter: -Of note, she recently had ESBL bacteremia in January, treated with 7 days ertapenem. -Urine cx on 2/8 and on 2/11 positive for ESBL e. Coli and enterococcus faecium -Augmentin per Dr. Emi Holes (urology) at OSH. Started on ertapenem at presentation at OSH, but transitioned back to augmentin.  Persistent hypokalemia Potassium 2.3 Repleted with KCl p.o. 40 mEq x 3 doses  Hypomagnesemia Magnesium 1.4 Repleted with IV magnesium 4 g once  MRSA positive Suspect from colonization Blood culture negative to date x2  Type 2 diabetes uncontrolled with hyperglycemia Hemoglobin A1c 8.1 on 07/11/2018 Continue insulin sliding scale Hold Januvia  Abdominal Discomfort suspect secondary to possible stercoral colitis: -large stool on CT abd -Required manual disimpaction. Continue cathartics as tolerated -Reports having bowel movements  Hypokalemia Potassium 2.5 Repleted with oral potassium 40 mEq x 3 doses Magnesium 1.8  AKI Baseline creatinine appears to be 0.9 Creatinine today 1.21 Continue IV fluid hydration Avoid nephrotoxic agents/dehydration/hypotension Repeat BMP in the morning  Resolved acute diabetic encephalopathy   Generalized Weakness and Deconditioning: PT assessed and recommended SNF  Hydronephrosis: CT urogram on 06/29/2018 showed mild to moderate R hydroureteronehrosis which terminates at the junction of middle and distal thirds of the R ureter. No urinary tract calculi. The possibility of a ureteral stricture or urotheial lesion in the ureter should be considered. Urologic consultation is recommended. These findings could be better evaluated with follow up hematuria protocal CT scan if clinically appropriate.  Hypertension:holdingamlodipine, hydralazinefor  now -BP stable and controlled at present  Hypothyroidism: Continue synthroid as tolerated  GERD: PPI as noted above  Chronic Diastolic OI:BBCW Bumex.  Continue gentle IV fluid hydration.  Hormone Replacement Therapy: currently holding estradiol. Consider discontinuing given risk with continued use at her age.  Physical debility/ambulatory dysfunction PT OT recommending SNF Fall precautions CSW consulted for placement  DVT prophylaxis:  Subcu Lovenox daily Code Status: DNR Family Communication:  None at bedside Disposition Plan:  SNF when hemodynamically stable and a bed is available  Consultants:   GI    Antimicrobials:             Anti-infectives (From admission, onward)     Start        Dose/Rate  Route  Frequency  Ordered  Stop     07/07/18 1600    ertapenem (INVANZ) 1,000 mg in sodium chloride 0.9 % 100 mL IVPB       1 g  200 mL/hr over 30 Minutes  Intravenous  Every 24 hours  07/07/18 1431            Objective: Vitals:   07/11/18 2115 07/12/18 0550 07/12/18 0851 07/12/18 1000  BP: 123/60 131/63  115/61  Pulse: 82 78    Resp: 20 19    Temp: 98.3 F (36.8 C) 98.1 F (36.7 C)  97.8 F (36.6 C)  TempSrc: Oral Oral  Oral  SpO2: 92% 94% 95% 96%  Weight:  93.4 kg    Height:        Intake/Output Summary (Last 24 hours) at 07/12/2018 1207 Last data  filed at 07/12/2018 0930 Gross per 24 hour  Intake 900 ml  Output 1177 ml  Net -277 ml   Filed Weights   07/09/18 0500 07/10/18 0500 07/12/18 0550  Weight: 96.4 kg 96 kg 93.4 kg    Exam:  . General: 85 y.o. year-old female well-developed well-nourished in no acute distress.  Alert and within x3. . Cardiovascular: Regular rate and rhythm with no rubs or gallops.  No JVD or thyromegaly . Respiratory: Mild rales at bases with no wheezes.  Poor inspiratory effort.. . Abdomen: Soft mildly tender diffusely with normal bowel sounds x4 quadrants. . Musculoskeletal: Trace lower  extremity edema. 2/4 pulses in all 4 extremities. Marland Kitchen Psychiatry: Mood is appropriate for condition and setting   Data Reviewed: CBC: Recent Labs  Lab 07/07/18 1347  07/08/18 0448  07/09/18 0542 07/09/18 1320 07/10/18 0420 07/11/18 0726 07/12/18 0414  WBC 16.1*  --  14.5*  --   --   --  14.2* 14.7* 15.1*  NEUTROABS  --   --   --   --   --   --   --  11.6* 11.1*  HGB 10.5*   < > 9.4*   < > 8.9* 8.9* 10.2* 11.1* 10.9*  HCT 33.7*   < > 30.5*   < > 29.1* 28.7* 32.7* 35.9* 35.7*  MCV 88.7  --  90.0  --   --   --  89.6 90.4 90.6  PLT 267  --  258  --   --   --  298 372 403*   < > = values in this interval not displayed.   Basic Metabolic Panel: Recent Labs  Lab 07/07/18 1347 07/08/18 0448 07/10/18 0420 07/11/18 0726 07/12/18 0414  NA 137 135 138 141 139  K 3.4* 3.4* 2.0* 2.3* 2.5*  CL 101 103 98 98 98  CO2 '27 23 28 29 27  '$ GLUCOSE 217* 258* 208* 221* 163*  BUN 41* 27* '12 15 20  '$ CREATININE 1.13* 1.00 0.95 1.17* 1.21*  CALCIUM 8.5* 8.5* 8.4* 8.3* 7.8*  MG  --   --  1.5* 1.4* 1.8  PHOS  --   --   --   --  3.3   GFR: Estimated Creatinine Clearance: 38.1 mL/min (A) (by C-G formula based on SCr of 1.21 mg/dL (H)). Liver Function Tests: Recent Labs  Lab 07/07/18 1347 07/08/18 0448 07/10/18 0420 07/12/18 0414  AST '18 17 21 25  '$ ALT '16 16 23 27  '$ ALKPHOS 72 67 75 79  BILITOT 0.6 0.5 0.5 0.5  PROT 5.8* 5.5* 5.9* 6.0*  ALBUMIN 2.6* 2.4* 2.3* 2.5*   No results for input(s): LIPASE, AMYLASE in the last 168 hours. Recent Labs  Lab 07/07/18 1423  AMMONIA 13   Coagulation Profile: No results for input(s): INR, PROTIME in the last 168 hours. Cardiac Enzymes: No results for input(s): CKTOTAL, CKMB, CKMBINDEX, TROPONINI in the last 168 hours. BNP (last 3 results) No results for input(s): PROBNP in the last 8760 hours. HbA1C: Recent Labs    07/11/18 0744  HGBA1C 8.1*   CBG: Recent Labs  Lab 07/11/18 1149 07/11/18 1641 07/11/18 2156 07/12/18 0735 07/12/18 1158    GLUCAP 253* 197* 272* 167* 163*   Lipid Profile: No results for input(s): CHOL, HDL, LDLCALC, TRIG, CHOLHDL, LDLDIRECT in the last 72 hours. Thyroid Function Tests: No results for input(s): TSH, T4TOTAL, FREET4, T3FREE, THYROIDAB in the last 72 hours. Anemia Panel: No results for input(s): VITAMINB12, FOLATE, FERRITIN, TIBC, IRON, RETICCTPCT in  the last 72 hours. Urine analysis:    Component Value Date/Time   COLORURINE YELLOW 07/07/2018 1448   APPEARANCEUR CLOUDY (A) 07/07/2018 1448   LABSPEC 1.012 07/07/2018 1448   PHURINE 5.0 07/07/2018 1448   GLUCOSEU NEGATIVE 07/07/2018 1448   HGBUR SMALL (A) 07/07/2018 1448   BILIRUBINUR NEGATIVE 07/07/2018 1448   KETONESUR NEGATIVE 07/07/2018 1448   PROTEINUR NEGATIVE 07/07/2018 1448   NITRITE NEGATIVE 07/07/2018 1448   LEUKOCYTESUR LARGE (A) 07/07/2018 1448   Sepsis Labs: '@LABRCNTIP'$ (procalcitonin:4,lacticidven:4)  ) Recent Results (from the past 240 hour(s))  Culture, Urine     Status: Abnormal   Collection Time: 07/07/18  2:48 PM  Result Value Ref Range Status   Specimen Description   Final    URINE, RANDOM Performed at [facility], [place] [address], [place], [place] [address]    Special Requests   Final    NONE Performed at [facility], [place] [address] [place], [place] [address]    Culture 30,000 COLONIES/mL YEAST (A)  Final   Report Status 07/09/2018 FINAL  Final  Culture, blood (routine x 2)     Status: None   Collection Time: 07/07/18  3:40 PM  Result Value Ref Range Status   Specimen Description RIGHT ANTECUBITAL  Final   Special Requests   Final    BOTTLES DRAWN AEROBIC AND ANAEROBIC Blood Culture adequate volume Performed at [place] [address] [place], [place] [address]    Culture NO GROWTH 5 DAYS  Final   Report Status 07/12/2018 FINAL  Final  Culture, blood (routine x 2)     Status: None   Collection Time: 07/07/18  3:50 PM  Result Value Ref  Range Status   Specimen Description BLOOD RIGHT ARM  Final   Special Requests   Final    BOTTLES DRAWN AEROBIC ONLY Blood Culture adequate volume Performed at [facility], [place] [address], [place], [place] [address]    Culture NO GROWTH 5 DAYS  Final   Report Status 07/12/2018 FINAL  Final  MRSA PCR Screening     Status: Abnormal   Collection Time: 07/09/18 11:43 AM  Result Value Ref Range Status   MRSA by PCR POSITIVE (A) NEGATIVE Final    Comment:        The GeneXpert MRSA Assay (FDA approved for NASAL specimens only), is one component of a comprehensive MRSA colonization surveillance program. It is not intended to diagnose MRSA infection nor to guide or monitor treatment for MRSA infections. RESULT CALLED TO, READ BACK BY AND VERIFIED WITH: C.ELLIS AT 1544 ON 07/09/18 BY N.THOMPSON Performed at [facility], [place] [address], [place], [place] [address]       Studies: Dg Chest Port 1 View  Result Date: 07/11/2018 CLINICAL DATA:  Hypoxia and cough. EXAM: PORTABLE CHEST 1 VIEW COMPARISON:  06/10/2018 FINDINGS: Left-sided pacemaker unchanged. Lungs are hypoinflated demonstrate worsening airspace opacification over the right perihilar region/medial right base. Mild prominence of the left infrahilar markings. No effusion. Cardiomediastinal silhouette and remainder of the exam is unchanged. IMPRESSION: Worsening opacification over the right infrahilar region/medial right base as well as prominence of the left infrahilar markings. Findings may be due to mild interstitial edema versus infection. Electronically Signed   By: Marin Olp M.D.   On: 07/11/2018 15:45    Scheduled Meds: . arformoterol  15 mcg Nebulization BID  . budesonide (PULMICORT) nebulizer solution  0.25 mg Nebulization BID  . bumetanide  6 mg Oral QAC breakfast   And  . bumetanide  4 mg Oral q1800  . Chlorhexidine Gluconate Cloth  6 each Topical Q0600  . enoxaparin (LOVENOX)  injection  40 mg Subcutaneous Q24H  . feeding supplement  1 Container Oral TID BM  . insulin aspart  0-5 Units Subcutaneous QHS  . insulin aspart  0-9 Units Subcutaneous TID WC  . levothyroxine  125 mcg Oral Q0600  . mouth rinse  15 mL Mouth Rinse BID  . mupirocin ointment  1 application Nasal BID  . nystatin   Topical TID  . pantoprazole  40 mg Oral BID AC  . polyvinyl alcohol  1 drop Both Eyes QHS  . potassium chloride  40 mEq Oral TID    Continuous Infusions: . sodium chloride 75 mL/hr at 07/11/18 1842  . sodium chloride 250 mL (07/09/18 1702)  . piperacillin-tazobactam (ZOSYN)  IV 3.375 g (07/12/18 0934)  . [START ON 07/13/2018] vancomycin       LOS: 5 days     Kayleen Memos, MD [facility] Pager [phone]  If 7PM-7AM, please contact night-coverage [email or web address] Password [facility] 07/12/2018, 12:07 PM

## 2018-07-12 NOTE — Progress Notes (Signed)
ANTICOAGULATION CONSULT NOTE - Initial Consult  Pharmacy Consult for Heparin Indication: pulmonary embolus  Allergies  Allergen Reactions  . Codeine Other (See Comments)    Per [facility]    Patient Measurements: Height: 5\' 2"  (157.5 cm) Weight: 205 lb 14.6 oz (93.4 kg) IBW/kg (Calculated) : 50.1 Heparin Dosing Weight: 72 kg  Vital Signs: Temp: 97.3 F (36.3 C) (02/21 1514) Temp Source: Oral (02/21 1514) BP: 125/59 (02/21 1514) Pulse Rate: 77 (02/21 1514)  Labs: Recent Labs    07/10/18 0420 07/11/18 0726 07/12/18 0414  HGB 10.2* 11.1* 10.9*  HCT 32.7* 35.9* 35.7*  PLT 298 372 403*  CREATININE 0.95 1.17* 1.21*    Estimated Creatinine Clearance: 38.1 mL/min (A) (by C-G formula based on SCr of 1.21 mg/dL (H)).   Medical History: Past Medical History:  Diagnosis Date  . CHF (congestive heart failure) ([place])   . COPD (chronic obstructive pulmonary disease) ([place])   . Diabetes mellitus without complication ([place])   . GERD (gastroesophageal reflux disease)   . Hypertension   . Hypothyroidism   . Presence of permanent cardiac pacemaker     Assessment: 85 y/o F admitted initially admitted with GIB found to have PE. Per notes, no further hematemtesis. GI signed off 2/19. CBC stable x 3 days.   Goal of Therapy:  Heparin level 0.3-0.7 units/ml Monitor platelets by anticoagulation protocol: Yes   Plan:  Give 4000 units bolus x 1 Start heparin infusion at 1200 units/hr Check anti-Xa level in 8 hours and daily while on heparin Continue to monitor H&H and platelets  Ulice Dash D 07/12/2018,5:24 PM

## 2018-07-13 DIAGNOSIS — B49 Unspecified mycosis: Secondary | ICD-10-CM

## 2018-07-13 DIAGNOSIS — R140 Abdominal distension (gaseous): Secondary | ICD-10-CM

## 2018-07-13 DIAGNOSIS — J449 Chronic obstructive pulmonary disease, unspecified: Secondary | ICD-10-CM

## 2018-07-13 DIAGNOSIS — E119 Type 2 diabetes mellitus without complications: Secondary | ICD-10-CM

## 2018-07-13 DIAGNOSIS — J189 Pneumonia, unspecified organism: Secondary | ICD-10-CM

## 2018-07-13 DIAGNOSIS — Z87440 Personal history of urinary (tract) infections: Secondary | ICD-10-CM

## 2018-07-13 DIAGNOSIS — K922 Gastrointestinal hemorrhage, unspecified: Secondary | ICD-10-CM

## 2018-07-13 DIAGNOSIS — I2699 Other pulmonary embolism without acute cor pulmonale: Secondary | ICD-10-CM

## 2018-07-13 LAB — HEPARIN LEVEL (UNFRACTIONATED)
Heparin Unfractionated: <0.1 IU/mL — ABNORMAL LOW (ref 0.30–0.70)
Heparin Unfractionated: 0.56 IU/mL (ref 0.30–0.70)
Heparin Unfractionated: 0.42 IU/mL (ref 0.30–0.70)

## 2018-07-13 LAB — GLUCOSE, CAPILLARY
Glucose-Capillary: 169 mg/dL — ABNORMAL HIGH (ref 70–99)
Glucose-Capillary: 162 mg/dL — ABNORMAL HIGH (ref 70–99)
Glucose-Capillary: 203 mg/dL — ABNORMAL HIGH (ref 70–99)
Glucose-Capillary: 163 mg/dL — ABNORMAL HIGH (ref 70–99)

## 2018-07-13 LAB — BASIC METABOLIC PANEL
Anion gap: 12 (ref 5–15)
BUN: 17 mg/dL (ref 8–23)
CO2: 25 mmol/L (ref 22–32)
Calcium: 7.9 mg/dL — ABNORMAL LOW (ref 8.9–10.3)
Chloride: 102 mmol/L (ref 98–111)
Creatinine, Ser: 1.08 mg/dL — ABNORMAL HIGH (ref 0.44–1.00)
GFR calc Af Amer: 55 mL/min — ABNORMAL LOW (ref >60)
GFR calc non Af Amer: 48 mL/min — ABNORMAL LOW (ref >60)
Glucose, Bld: 159 mg/dL — ABNORMAL HIGH (ref 70–99)
Potassium: 2.6 mmol/L — CL (ref 3.5–5.1)
Sodium: 139 mmol/L (ref 135–145)

## 2018-07-13 LAB — CBC
HCT: 35.7 % — ABNORMAL LOW (ref 36.0–46.0)
Hemoglobin: 10.6 g/dL — ABNORMAL LOW (ref 12.0–15.0)
MCH: 27.7 pg (ref 26.0–34.0)
MCHC: 29.7 g/dL — ABNORMAL LOW (ref 30.0–36.0)
MCV: 93.5 fL (ref 80.0–100.0)
PLATELETS: 351 10*3/uL (ref 150–400)
RBC: 3.82 MIL/uL — ABNORMAL LOW (ref 3.87–5.11)
RDW: 15.7 % — ABNORMAL HIGH (ref 11.5–15.5)
WBC: 16.2 10*3/uL — ABNORMAL HIGH (ref 4.0–10.5)
nRBC: 0 % (ref 0.0–0.2)

## 2018-07-13 LAB — CREATININE, SERUM
CREATININE: 1.17 mg/dL — AB (ref 0.44–1.00)
GFR calc Af Amer: 50 mL/min — ABNORMAL LOW (ref >60)
GFR calc non Af Amer: 43 mL/min — ABNORMAL LOW (ref >60)

## 2018-07-13 MED ORDER — HEPARIN BOLUS VIA INFUSION
2000.0000 [IU] | Freq: Once | INTRAVENOUS | Status: AC
  Administered 2018-07-13: 2000 [IU] via INTRAVENOUS
  Filled 2018-07-13: qty 2000

## 2018-07-13 MED ORDER — MAGNESIUM SULFATE 2 GM/50ML IV SOLN
2.0000 g | Freq: Once | INTRAVENOUS | Status: AC
  Administered 2018-07-13: 2 g via INTRAVENOUS
  Filled 2018-07-13: qty 50

## 2018-07-13 MED ORDER — FLUCONAZOLE 100 MG PO TABS
100.0000 mg | ORAL_TABLET | Freq: Every day | ORAL | Status: DC
  Administered 2018-07-13: 100 mg via ORAL
  Filled 2018-07-13: qty 1

## 2018-07-13 MED ORDER — POTASSIUM CHLORIDE CRYS ER 20 MEQ PO TBCR
40.0000 meq | EXTENDED_RELEASE_TABLET | Freq: Four times a day (QID) | ORAL | Status: AC
  Administered 2018-07-13 – 2018-07-14 (×4): 40 meq via ORAL
  Filled 2018-07-13 (×4): qty 2

## 2018-07-13 MED ORDER — POTASSIUM CHLORIDE CRYS ER 20 MEQ PO TBCR: 40.0000 meq | EXTENDED_RELEASE_TABLET | Freq: Three times a day (TID) | ORAL | Status: DC

## 2018-07-13 MED ORDER — HEPARIN (PORCINE) 25000 UT/250ML-% IV SOLN
1400.0000 [IU]/h | INTRAVENOUS | Status: DC
  Administered 2018-07-13 – 2018-07-14 (×4): 1400 [IU]/h via INTRAVENOUS
  Filled 2018-07-13 (×3): qty 250

## 2018-07-13 NOTE — Progress Notes (Signed)
ANTICOAGULATION CONSULT NOTE - Consult  Pharmacy Consult for Heparin Indication: pulmonary embolus  Allergies  Allergen Reactions  . Codeine Other (See Comments)    Per [facility]    Patient Measurements: Height: 5\' 2"  (157.5 cm) Weight: 205 lb 14.6 oz (93.4 kg) IBW/kg (Calculated) : 50.1 Heparin Dosing Weight: 72 kg  Vital Signs: Temp: 97.6 F (36.4 C) (02/22 0619) Temp Source: Oral (02/22 0619) BP: 116/72 (02/22 0619) Pulse Rate: 77 (02/22 0619)  Labs: Recent Labs    07/11/18 0726 07/12/18 0414 07/12/18 1915 07/13/18 0234 07/13/18 1212  HGB 11.1* 10.9*  --  10.6*  --   HCT 35.9* 35.7*  --  35.7*  --   PLT 372 403*  --  351  --   APTT  --   --  105*  --   --   LABPROT  --   --  16.4*  --   --   INR  --   --  1.33  --   --   HEPARINUNFRC  --   --   --  <0.10* 0.56  CREATININE 1.17* 1.21*  --  1.17*  --     Estimated Creatinine Clearance: 39.4 mL/min (A) (by C-G formula based on SCr of 1.17 mg/dL (H)).   Medical History: Past Medical History:  Diagnosis Date  . CHF (congestive heart failure) ([place])   . COPD (chronic obstructive pulmonary disease) ([place])   . Diabetes mellitus without complication ([place])   . GERD (gastroesophageal reflux disease)   . Hypertension   . Hypothyroidism   . Presence of permanent cardiac pacemaker     Assessment: 85 y/o F admitted initially admitted with GIB found to have PE. Per notes, no further hematemtesis. GI signed off 2/19. CBC stable x 3 days.   07/13/2018:  Heparin level at goal (0.56) on 1400 units/hr  Per RN no bleeding issues and no infusion issues  CBC stable  Goal of Therapy:  Heparin level 0.3-0.7 units/ml Monitor platelets by anticoagulation protocol: Yes   Plan:  Continue heparin infusion at 1400 units/hr Repeat heparin level in 6h to verify rate Daily CBC/HL  Alexah Kivett, Lavonia Drafts 07/13/2018,1:10 PM

## 2018-07-13 NOTE — Progress Notes (Signed)
CRITICAL VALUE ALERT  Critical Value:  K+ 2.6  Date & Time Notied:  07/13/2018 @1654   Provider Notified: Dr. Nevada Crane   Orders Received/Actions taken:

## 2018-07-13 NOTE — Progress Notes (Addendum)
PROGRESS NOTE  Danielle Dennis QMG:[id number] DOB: April 24, 1934 DOA: 07/07/2018 PCP: Emmaline Kluver, MD  HPI/Recap of past 24 hours: 85 y.o.femalewith medical history significant ofHFpEF, COPD, T2DM, HTN, Hypothyroidism, ESBL e coli bacteremia and multiple other medical problems who presented to [facility] initially with concern for the "foley not draining right all day" on 07/03/18. Pt also noted generalized weakness and difficulty ambulating. Urine cx from 06/29/2018 showed ESBL E. Coli and pan sensitive E faecium. Pt follows with Dr. Emi Holes (urology, who had started pt on ceftin and then subsequently changed to augmentin). Initially plan was for observation and d/c to SNF on 2/13, but she had severe hypokalemia to 2.1 and acute kidney injury. On 2/14, plan for d/c to SNF, but pt had large bowel movement with blood. Pt was monitored another night and did not have any additional episdoes for blood in stool or black stool, but then started having episodes of coffee-ground low volume emesis.  GI was consulted.  No further signs of hematemesis and rectal bleeding probably from stercoral colitis.  GI signed off on 07/10/2018.  Hospital course complicated by pulmonary embolism, clinically undetermined if chronic, and suspected discitis at L2-L3.  07/13/2018: Patient seen and examined at bedside.  No acute events overnight.  She has no new complaints.  She denies chest pain or dyspnea at rest.    Assessment/Plan: Principal Problem:   GI bleed Active Problems:   UTI due to extended-spectrum beta lactamase (ESBL) producing Escherichia coli   (HFpEF) heart failure with preserved ejection fraction (HCC)   T2DM (type 2 diabetes mellitus) ([place])   Resolved upper GI bleed:  GI consulted and signed off on 07/10/2018 -PPI BID -GI consulted. No longer plans for endoscopy as pt seems to be improved from a GI standpoint -No sign of overt bleeding -No recurrence of GI bleed noted in the last  48 hours.  R Pulmonary embolism with mild right strain, clinically undetermined if chronic Positive CTA PE done on 07/12/2018 independently reviewed Continue hep drip, dosed by pharmacy Maintain O2 sat >92%  Acute on chronic hypoxic respiratory failure suspect secondary to PE vs bilateral HCAP  Reports on 2 L of oxygen as needed at baseline Now requiring 4 L to maintain O2 saturation greater than 90% Maintain O2 saturation 92% or greater Close monitoring of vital signs on telemetry Treat PE and HCAP  Suspected L2-L3 discitis/osteomyelitis ESR and CRP elevated Obtain L2-L3 aspiration by interventional radiology Procedure planned for Monday, 07/15/2018 Continue IV antibiotics empirically  ID consulted   Candiduria in the setting of indwelling Foley catheter Reports suprapubic pain on 07/12/18 Received 2 doses of Diflucan  DC Diflucan per ID recs on 07/13/18 Remove Foley catheter Unclear/questionable history of urinary retention with indwelling Foley catheter Closely monitor urine output after removing Foley catheter  Bilateral HCAP Continue IV antibiotics IV vanc and Zosyn Maintain O2 saturation greater than 92%  Lactic acidosis 2/2 R sided PE Lactic acid peaked at >4.0 Now trending down  Leukocytosis, multifactorial Reactive in the setting of PE vs HCAP Vs suspected L2-L3 osteomyelitis/discitis Elevated lactic acid  WBC 15k from 14k Continue IV Zosyn and IV vanc empirically Repeat CBC in the morning  Urinary Tract Infection Indwelling Foley Catheter: -Of note, she recently had ESBL bacteremia in January, treated with 7 days ertapenem. -Urine cx on 2/8 and on 2/11 positive for ESBL e. Coli and enterococcus faecium -Augmentin per Dr. Emi Holes (urology) at OSH. Started on ertapenem at presentation at OSH, but transitioned back to  augmentin.  Persistent hypokalemia Potassium 2.3 Repleted with KCl p.o. 40 mEq x 3 doses Obtain BMP  Hypomagnesemia Magnesium  1.4 Repleted with IV magnesium 4 g once Repeat magnesium level in the morning  MRSA positive Suspect from colonization Blood culture negative to date x2  Type 2 diabetes uncontrolled with hyperglycemia Hemoglobin A1c 8.1 on 07/11/2018 Continue insulin sliding scale Hold Januvia  Abdominal Discomfort suspect secondary to possible stercoral colitis: -large stool on CT abd -Required manual disimpaction. Continue cathartics as tolerated -Reports having bowel movements  AKI Baseline creatinine appears to be 0.9 Latest creatinine 1.21 Continue IV fluid hydration Avoid nephrotoxic agents/dehydration/hypotension Repeat BMP in the morning  Resolved acute diabetic encephalopathy   Generalized Weakness and Deconditioning: PT assessed and recommended SNF  Hydronephrosis: CT urogram on 06/29/2018 showed mild to moderate R hydroureteronehrosis which terminates at the junction of middle and distal thirds of the R ureter. No urinary tract calculi. The possibility of a ureteral stricture or urothelial lesion in the ureter should be considered. Urologic consultation is recommended. These findings could be better evaluated with follow up hematuria protocal CT scan if clinically appropriate.  Hypertension:holdingamlodipine, hydralazinefor now -BP stable and controlled at present  Hypothyroidism: Continue synthroid as tolerated  GERD: PPI as noted above  Chronic Diastolic OI:NOMV Bumex.  Continue gentle IV fluid hydration half-normal saline at 50 cc/h.  Hormone Replacement Therapy: currently holding estradiol. Consider discontinuing given risk with continued use at her age and new pulmonary embolism.  Physical debility/ambulatory dysfunction PT OT recommending SNF Fall precautions CSW consulted for placement  DVT prophylaxis:  Subcu Lovenox daily Code Status: DNR Family Communication:  None at bedside Disposition Plan:  SNF when hemodynamically stable and a bed is  available  Consultants:   GI  Infectious disease    Antimicrobials:             Anti-infectives (From admission, onward)     Start        Dose/Rate  Route  Frequency  Ordered  Stop     07/07/18 1600    ertapenem (INVANZ) 1,000 mg in sodium chloride 0.9 % 100 mL IVPB       1 g  200 mL/hr over 30 Minutes  Intravenous  Every 24 hours  07/07/18 1431            Objective: Vitals:   07/13/18 0619 07/13/18 0929 07/13/18 0933 07/13/18 1429  BP: 116/72   (!) 116/53  Pulse: 77   80  Resp: 15   16  Temp: 97.6 F (36.4 C)   98 F (36.7 C)  TempSrc: Oral   Oral  SpO2: 93% 91% 91% 95%  Weight:      Height:        Intake/Output Summary (Last 24 hours) at 07/13/2018 1553 Last data filed at 07/13/2018 1431 Gross per 24 hour  Intake 3488.74 ml  Output 1300 ml  Net 2188.74 ml   Filed Weights   07/09/18 0500 07/10/18 0500 07/12/18 0550  Weight: 96.4 kg 96 kg 93.4 kg    Exam:  . General: 85 y.o. year-old female well-developed well-nourished in no acute distress.  Alert and oriented x3 . Cardiovascular: Regular rate and rhythm with no rubs or gallops.  No JVD or thyromegaly. Marland Kitchen Respiratory: Clear to auscultation with no wheezes or rales.  Poor inspiratory effort . Abdomen: Soft mildly tender diffusely with normal bowel sounds x4 quadrants. . Musculoskeletal: Trace lower extremity edema. 2/4 pulses in all 4 extremities. Marland Kitchen Psychiatry: Mood is  appropriate for condition and setting   Data Reviewed: CBC: Recent Labs  Lab 07/08/18 0448  07/09/18 1320 07/10/18 0420 07/11/18 0726 07/12/18 0414 07/13/18 0234  WBC 14.5*  --   --  14.2* 14.7* 15.1* 16.2*  NEUTROABS  --   --   --   --  11.6* 11.1*  --   HGB 9.4*   < > 8.9* 10.2* 11.1* 10.9* 10.6*  HCT 30.5*   < > 28.7* 32.7* 35.9* 35.7* 35.7*  MCV 90.0  --   --  89.6 90.4 90.6 93.5  PLT 258  --   --  298 372 403* 351   < > = values in this interval not displayed.   Basic Metabolic Panel: Recent  Labs  Lab 07/07/18 1347 07/08/18 0448 07/10/18 0420 07/11/18 0726 07/12/18 0414 07/13/18 0234  NA 137 135 138 141 139  --   K 3.4* 3.4* 2.0* 2.3* 2.5*  --   CL 101 103 98 98 98  --   CO2 '27 23 28 29 27  '$ --   GLUCOSE 217* 258* 208* 221* 163*  --   BUN 41* 27* '12 15 20  '$ --   CREATININE 1.13* 1.00 0.95 1.17* 1.21* 1.17*  CALCIUM 8.5* 8.5* 8.4* 8.3* 7.8*  --   MG  --   --  1.5* 1.4* 1.8  --   PHOS  --   --   --   --  3.3  --    GFR: Estimated Creatinine Clearance: 39.4 mL/min (A) (by C-G formula based on SCr of 1.17 mg/dL (H)). Liver Function Tests: Recent Labs  Lab 07/07/18 1347 07/08/18 0448 07/10/18 0420 07/12/18 0414  AST '18 17 21 25  '$ ALT '16 16 23 27  '$ ALKPHOS 72 67 75 79  BILITOT 0.6 0.5 0.5 0.5  PROT 5.8* 5.5* 5.9* 6.0*  ALBUMIN 2.6* 2.4* 2.3* 2.5*   No results for input(s): LIPASE, AMYLASE in the last 168 hours. Recent Labs  Lab 07/07/18 1423  AMMONIA 13   Coagulation Profile: Recent Labs  Lab 07/12/18 1915  INR 1.33   Cardiac Enzymes: No results for input(s): CKTOTAL, CKMB, CKMBINDEX, TROPONINI in the last 168 hours. BNP (last 3 results) No results for input(s): PROBNP in the last 8760 hours. HbA1C: Recent Labs    07/11/18 0744  HGBA1C 8.1*   CBG: Recent Labs  Lab 07/12/18 1158 07/12/18 1701 07/12/18 2050 07/13/18 0731 07/13/18 1112  GLUCAP 163* 212* 160* 169* 162*   Lipid Profile: No results for input(s): CHOL, HDL, LDLCALC, TRIG, CHOLHDL, LDLDIRECT in the last 72 hours. Thyroid Function Tests: No results for input(s): TSH, T4TOTAL, FREET4, T3FREE, THYROIDAB in the last 72 hours. Anemia Panel: No results for input(s): VITAMINB12, FOLATE, FERRITIN, TIBC, IRON, RETICCTPCT in the last 72 hours. Urine analysis:    Component Value Date/Time   COLORURINE YELLOW 07/07/2018 1448   APPEARANCEUR CLOUDY (A) 07/07/2018 1448   LABSPEC 1.012 07/07/2018 1448   PHURINE 5.0 07/07/2018 1448   GLUCOSEU NEGATIVE 07/07/2018 1448   HGBUR SMALL (A)  07/07/2018 1448   BILIRUBINUR NEGATIVE 07/07/2018 1448   KETONESUR NEGATIVE 07/07/2018 1448   PROTEINUR NEGATIVE 07/07/2018 1448   NITRITE NEGATIVE 07/07/2018 1448   LEUKOCYTESUR LARGE (A) 07/07/2018 1448   Sepsis Labs: '@LABRCNTIP'$ (procalcitonin:4,lacticidven:4)  ) Recent Results (from the past 240 hour(s))  Culture, Urine     Status: Abnormal   Collection Time: 07/07/18  2:48 PM  Result Value Ref Range Status   Specimen Description   Final  URINE, RANDOM Performed at [facility], [place] [address] [place], [place] [address]    Special Requests   Final    NONE Performed at [facility], [place] [address] [place], [place] [address]    Culture 30,000 COLONIES/mL YEAST (A)  Final   Report Status 07/09/2018 FINAL  Final  Culture, blood (routine x 2)     Status: None   Collection Time: 07/07/18  3:40 PM  Result Value Ref Range Status   Specimen Description RIGHT ANTECUBITAL  Final   Special Requests   Final    BOTTLES DRAWN AEROBIC AND ANAEROBIC Blood Culture adequate volume Performed at [place] [address], [place], [place] [address]    Culture NO GROWTH 5 DAYS  Final   Report Status 07/12/2018 FINAL  Final  Culture, blood (routine x 2)     Status: None   Collection Time: 07/07/18  3:50 PM  Result Value Ref Range Status   Specimen Description BLOOD RIGHT ARM  Final   Special Requests   Final    BOTTLES DRAWN AEROBIC ONLY Blood Culture adequate volume Performed at [facility], [place] [address], [place], [place] [address]    Culture NO GROWTH 5 DAYS  Final   Report Status 07/12/2018 FINAL  Final  MRSA PCR Screening     Status: Abnormal   Collection Time: 07/09/18 11:43 AM  Result Value Ref Range Status   MRSA by PCR POSITIVE (A) NEGATIVE Final    Comment:        The GeneXpert MRSA Assay (FDA approved for NASAL specimens only), is one component of a comprehensive MRSA  colonization surveillance program. It is not intended to diagnose MRSA infection nor to guide or monitor treatment for MRSA infections. RESULT CALLED TO, READ BACK BY AND VERIFIED WITH: C.ELLIS AT 1544 ON 07/09/18 BY N.THOMPSON Performed at [facility], [place] [address] [place], [place] [address]   Culture, blood (routine x 2)     Status: None (Preliminary result)   Collection Time: 07/12/18  7:00 PM  Result Value Ref Range Status   Specimen Description BLOOD SITE NOT SPECIFIED  Final   Special Requests   Final    BOTTLES DRAWN AEROBIC AND ANAEROBIC Blood Culture adequate volume Performed at [place] [address] [place], [place] [address]    Culture NO GROWTH < 24 HOURS  Final   Report Status PENDING  Incomplete  Culture, blood (routine x 2)     Status: None (Preliminary result)   Collection Time: 07/12/18  7:16 PM  Result Value Ref Range Status   Specimen Description BLOOD  Final   Special Requests   Final    BOTTLES DRAWN AEROBIC ONLY Blood Culture adequate volume Performed at [place] [address] [place],  [address]    Culture NO GROWTH < 24 HOURS  Final   Report Status PENDING  Incomplete      Studies: Ct Head Wo Contrast  Result Date: 07/12/2018 CLINICAL DATA:  Confusion and cephalopathy. History of hypertension and diabetes. EXAM: CT HEAD WITHOUT CONTRAST TECHNIQUE: Contiguous axial images were obtained from the base of the skull through the vertex without intravenous contrast. COMPARISON:  CT HEAD November 09, 2011 FINDINGS: BRAIN: No intraparenchymal hemorrhage, mass effect nor midline shift. No parenchymal brain volume loss for age. No hydrocephalus. Patchy supratentorial white matter hypodensities within normal range for patient's age, though non-specific are most compatible with  chronic small vessel ischemic disease. No acute large vascular territory infarcts. No abnormal extra-axial fluid  collections. Basal cisterns are patent. VASCULAR: Moderate calcific atherosclerosis of the carotid siphons. SKULL: No skull fracture. No significant scalp soft tissue swelling. SINUSES/ORBITS: RIGHT maxillary mucosal retention cyst. Mastoid air cells are well aerated.The included ocular globes and orbital contents are non-suspicious. Status post bilateral ocular lens implants. OTHER: 2.1 cm chronic LEFT facial sebaceous cyst. IMPRESSION: Negative non-contrast CT HEAD for age. Electronically Signed   By: Elon Alas M.D.   On: 07/12/2018 16:53   Ct Angio Chest/abd/pel For Dissection W And/or W/wo  Result Date: 07/12/2018 CLINICAL DATA:  Abdominal pain. EXAM: CT ANGIOGRAPHY CHEST, ABDOMEN AND PELVIS TECHNIQUE: Multidetector CT imaging through the chest, abdomen and pelvis was performed using the standard protocol during bolus administration of intravenous contrast. Multiplanar reconstructed images and MIPs were obtained and reviewed to evaluate the vascular anatomy. CONTRAST:  40m ISOVUE-370 IOPAMIDOL (ISOVUE-370) INJECTION 76% COMPARISON:  Abdominal CT 07/07/2018 FINDINGS: CTA CHEST FINDINGS Cardiovascular: There is a nonocclusive right pulmonary embolus within the lobar branch of the right upper lobe, extending to the segmental branches. There is a possible smaller nonocclusive pulmonary embolus in a basilar segmental branch in the left lower lobe. There is mild right heart strain with RV/LV ratio of 1.1. Mild atherosclerotic disease of the aorta with no evidence of dissection or aneurysmal dilation. Mediastinum/Nodes: No enlarged mediastinal, hilar, or axillary lymph nodes. Thyroid gland, trachea, and esophagus demonstrate no significant findings. Small hiatal hernia. Lungs/Pleura: Bilateral lower lobe airspace consolidation versus atelectasis. Musculoskeletal: No chest wall abnormality. No acute or significant osseous findings. Review of the MIP images confirms the above findings. CTA ABDOMEN AND PELVIS  FINDINGS VASCULAR Aorta: Normal caliber aorta without aneurysm, dissection, vasculitis or significant stenosis. Celiac: Patent without evidence of aneurysm, dissection, vasculitis or significant stenosis. SMA: Patent without evidence of aneurysm, dissection, vasculitis or significant stenosis. Renals: Both renal arteries are patent without evidence of aneurysm, dissection, vasculitis, fibromuscular dysplasia or significant stenosis. IMA: Patent without evidence of aneurysm, dissection, vasculitis or significant stenosis. Inflow: Patent without evidence of aneurysm, dissection, vasculitis or significant stenosis. Veins: No obvious venous abnormality within the limitations of this arterial phase study. Review of the MIP images confirms the above findings. NON-VASCULAR Hepatobiliary: Hepatic steatosis. Post cholecystectomy. Pancreas: Unremarkable. No pancreatic ductal dilatation or surrounding inflammatory changes. Spleen: Normal in size without focal abnormality. Adrenals/Urinary Tract: Normal adrenal glands. Normal appearance of the left kidney/left ureter and urinary bladder with Foley catheter in place. There is a right hydronephrosis and hydroureter to the level of the distal third of the right ureter. No obstructive calculus or obvious ureteral mass. Stomach/Bowel: Stomach is within normal limits. No evidence of appendicitis. No evidence of bowel wall thickening, distention, or inflammatory changes. Relatively fluid contents of the colon, may be associated with a diarrheal state. Nonspecific gaseous distension of the colon. Scattered colonic diverticula. Lymphatic: Aortic atherosclerosis. No enlarged abdominal or pelvic lymph nodes. Reproductive: Status post hysterectomy. No adnexal masses. Other: No abdominal wall hernia or abnormality. No abdominopelvic ascites. Musculoskeletal: Scoliosis of the lumbosacral spine. Multilevel osteoarthritic changes. Sclerosis and erosive endplate changes at LM0-N0centered around  the disc space. These changes are out of proportion to the degenerative disc disease seen at the remaining levels of the spine. Review of the MIP images confirms the above findings. IMPRESSION: 1. Nonocclusive right pulmonary embolus within the lobar branch of the right upper lobe, extending to the segmental branches. Possible smaller nonocclusive pulmonary embolus  in a basilar segmental branch of the left lower lobe. 2. Mild right heart strain with RV/LV ratio of 1. 3. Bilateral lower lobe airspace consolidation versus atelectasis. 4. Hepatic steatosis. 5. Right hydronephrosis and hydroureter to the level of the distal third of the right ureter. No obstructive calculus or obvious ureteral mass. 6. Sclerosis and erosive endplate changes at H8-E9 centered around the disc space. These changes are out of proportion to the osteoarthritic changes at the remaining levels of the spine. Findings may represent degenerative discogenic changes, however discitis/osteomyelitis may have a similar appearance. 7. Nonspecific gaseous distension of the colon and probable diarrheal state. These results were called by telephone at the time of interpretation on 07/12/2018 at 5:19 pm to Dr. Irene Pap , who verbally acknowledged these results. Electronically Signed   By: Fidela Salisbury M.D.   On: 07/12/2018 17:20    Scheduled Meds: . arformoterol  15 mcg Nebulization BID  . budesonide (PULMICORT) nebulizer solution  0.25 mg Nebulization BID  . Chlorhexidine Gluconate Cloth  6 each Topical Q0600  . feeding supplement  1 Container Oral TID BM  . fluconazole  100 mg Oral Daily  . insulin aspart  0-5 Units Subcutaneous QHS  . insulin aspart  0-9 Units Subcutaneous TID WC  . levothyroxine  125 mcg Oral Q0600  . lidocaine  1 patch Transdermal Q24H  . mouth rinse  15 mL Mouth Rinse BID  . mupirocin ointment  1 application Nasal BID  . nystatin   Topical TID  . pantoprazole  40 mg Oral BID AC  . polyvinyl alcohol  1 drop  Both Eyes QHS    Continuous Infusions: . sodium chloride 75 mL/hr at 07/13/18 0600  . sodium chloride 250 mL (07/09/18 1702)  . heparin 1,400 Units/hr (07/13/18 0820)  . piperacillin-tazobactam (ZOSYN)  IV 3.375 g (07/13/18 1133)  . vancomycin       LOS: 6 days     Kayleen Memos, MD [facility] Pager [phone]  If 7PM-7AM, please contact night-coverage [email or web address] Password [facility] 07/13/2018, 3:53 PM

## 2018-07-13 NOTE — Progress Notes (Signed)
PHARMACY NOTE:  ANTIMICROBIAL RENAL DOSAGE ADJUSTMENT  Current antimicrobial regimen includes a mismatch between antimicrobial dosage and estimated renal function.  As per policy approved by the Pharmacy & Therapeutics and Medical Executive Committees, the antimicrobial dosage will be adjusted accordingly.  Current antimicrobial dosage: Diflucan 200mg  daily Indication: candiduria  Renal Function:  Estimated Creatinine Clearance: 39.4 mL/min (A) (by C-G formula based on SCr of 1.17 mg/dL (H)). []      On intermittent HD, scheduled: []      On CRRT    Antimicrobial dosage has been changed to:  200mg  IV x1 then 100mg  PO daily for CrCl<47ml/min  Additional comments:   Thank you for allowing pharmacy to be a part of this patient's care.  Biagio Borg, [facility] 07/13/2018 1:15 PM

## 2018-07-13 NOTE — Consult Note (Signed)
[facility]    Date of Admission:  07/07/2018   Total days of antibiotics: 2 vanco/zosyn               Reason for Consult: Lumbar discitis    Referring Provider: Nevada Crane   Assessment: L2-3 discitis, suspected GI bleed HCAP PE Funguria   Plan: 1. Continue vanco/zosyn for now 2. Await IR aspirate 3. awiat BCx 4. Despite her CT scan, she does not have s/s of PNA 5. Would not treat funguria (prev indwelling foley).  6. Watch her h/h  Comment- Her IR aspirate may be (-) with her anbx.  Would consider changing her vanco/zosyn at 7 days (after HCAP treatment)  Thank you so much for this interesting consult,  Principal Problem:   GI bleed Active Problems:   UTI due to extended-spectrum beta lactamase (ESBL) producing Escherichia coli   (HFpEF) heart failure with preserved ejection fraction (HCC)   T2DM (type 2 diabetes mellitus) ([place])   . arformoterol  15 mcg Nebulization BID  . budesonide (PULMICORT) nebulizer solution  0.25 mg Nebulization BID  . Chlorhexidine Gluconate Cloth  6 each Topical Q0600  . feeding supplement  1 Container Oral TID BM  . fluconazole  100 mg Oral Daily  . insulin aspart  0-5 Units Subcutaneous QHS  . insulin aspart  0-9 Units Subcutaneous TID WC  . levothyroxine  125 mcg Oral Q0600  . lidocaine  1 patch Transdermal Q24H  . mouth rinse  15 mL Mouth Rinse BID  . mupirocin ointment  1 application Nasal BID  . nystatin   Topical TID  . pantoprazole  40 mg Oral BID AC  . polyvinyl alcohol  1 drop Both Eyes QHS    HPI: Danielle Dennis is a 85 y.o. female with hx of DM2, COPD, adm to [facility] 2-12 after noted to have decreased urine output.  She was found there to have ESBL E coli and E faecium in UCx as well as AKI with hypokalemia. Her course was further complicated by coffee ground emesis as well as blood in BM. She was transferred to [facility] on 2-16. She was eval by GI, started on IV PPI. She was ultimately  deffered from endoscopy. She was also started on carbapenem for her recent UTI.  She underwent CT abd which also revealed PNA. By 2-20 she developed episodes of hypoxia and on 2-21 she underwent CTA revealing PE. This scan also revealed suspected L2/3 discitis.   Her BCx 2-16 are (-) and her BCx 2-21 are ngtd.   Review of Systems: Review of Systems  Constitutional: Negative for chills and fever.  Respiratory: Negative for cough and shortness of breath.   Gastrointestinal: Negative for blood in stool, constipation, diarrhea, nausea and vomiting.  Genitourinary: Negative for dysuria and urgency.  Please see HPI. All other systems reviewed and negative.   Past Medical History:  Diagnosis Date  . CHF (congestive heart failure) ([place])   . COPD (chronic obstructive pulmonary disease) ([place])   . Diabetes mellitus without complication ([place])   . GERD (gastroesophageal reflux disease)   . Hypertension   . Hypothyroidism   . Presence of permanent cardiac pacemaker     Social History   Tobacco Use  . Smoking status: Never Smoker  . Smokeless tobacco: Never Used  Substance Use Topics  . Alcohol use: Not Currently  . Drug use: Not Currently    History reviewed. No pertinent family history.  Medications:  Scheduled: . arformoterol  15 mcg Nebulization BID  . budesonide (PULMICORT) nebulizer solution  0.25 mg Nebulization BID  . Chlorhexidine Gluconate Cloth  6 each Topical Q0600  . feeding supplement  1 Container Oral TID BM  . fluconazole  100 mg Oral Daily  . insulin aspart  0-5 Units Subcutaneous QHS  . insulin aspart  0-9 Units Subcutaneous TID WC  . levothyroxine  125 mcg Oral Q0600  . lidocaine  1 patch Transdermal Q24H  . mouth rinse  15 mL Mouth Rinse BID  . mupirocin ointment  1 application Nasal BID  . nystatin   Topical TID  . pantoprazole  40 mg Oral BID AC  . polyvinyl alcohol  1 drop Both Eyes QHS    Abtx:  Anti-infectives (From admission, onward)   Start      Dose/Rate Route Frequency Ordered Stop   07/13/18 1800  vancomycin (VANCOCIN) 1,250 mg in sodium chloride 0.9 % 250 mL IVPB     1,250 mg 166.7 mL/hr over 90 Minutes Intravenous Every 48 hours 07/11/18 1813     07/13/18 1400  fluconazole (DIFLUCAN) tablet 100 mg     100 mg Oral Daily 07/13/18 1318 07/26/18 0959   07/12/18 2000  fluconazole (DIFLUCAN) IVPB 200 mg  Status:  Discontinued     200 mg 100 mL/hr over 60 Minutes Intravenous Every 24 hours 07/12/18 1808 07/13/18 1318   07/11/18 1830  vancomycin (VANCOCIN) 2,000 mg in sodium chloride 0.9 % 500 mL IVPB     2,000 mg 250 mL/hr over 120 Minutes Intravenous  Once 07/11/18 1737 07/11/18 2049   07/11/18 1800  piperacillin-tazobactam (ZOSYN) IVPB 3.375 g     3.375 g 12.5 mL/hr over 240 Minutes Intravenous Every 8 hours 07/11/18 1750     07/07/18 1600  ertapenem (INVANZ) 1,000 mg in sodium chloride 0.9 % 100 mL IVPB  Status:  Discontinued     1 g 200 mL/hr over 30 Minutes Intravenous Every 24 hours 07/07/18 1431 07/11/18 1734        OBJECTIVE: Blood pressure (!) 116/53, pulse 80, temperature 98 F (36.7 C), temperature source Oral, resp. rate 16, height 5\' 2"  (1.575 m), weight 93.4 kg, SpO2 95 %.  Physical Exam Constitutional:      General: She is not in acute distress.    Appearance: She is obese. She is not toxic-appearing.  HENT:     Mouth/Throat:     Pharynx: Oropharynx is clear. No oropharyngeal exudate.  Eyes:     Extraocular Movements: Extraocular movements intact.     Pupils: Pupils are equal, round, and reactive to light.  Neck:     Musculoskeletal: Neck supple. No muscular tenderness.  Cardiovascular:     Rate and Rhythm: Normal rate and regular rhythm.  Pulmonary:     Effort: Pulmonary effort is normal.     Breath sounds: Normal breath sounds.  Abdominal:     General: There is no distension.     Palpations: Abdomen is soft.     Tenderness: There is no abdominal tenderness.    Musculoskeletal:        General:  No swelling.  Neurological:     Mental Status: She is alert.  Psychiatric:        Mood and Affect: Mood normal.     Lab Results Results for orders placed or performed during the hospital encounter of 07/07/18 (from the past 48 hour(s))  Procalcitonin - Baseline     Status: None  Collection Time: 07/11/18  3:42 PM  Result Value Ref Range   Procalcitonin 0.15 ng/mL    Comment:        Interpretation: PCT (Procalcitonin) <= 0.5 ng/mL: Systemic infection (sepsis) is not likely. Local bacterial infection is possible. (NOTE)       Sepsis PCT Algorithm           Lower Respiratory Tract                                      Infection PCT Algorithm    ----------------------------     ----------------------------         PCT < 0.25 ng/mL                PCT < 0.10 ng/mL         Strongly encourage             Strongly discourage   discontinuation of antibiotics    initiation of antibiotics    ----------------------------     -----------------------------       PCT 0.25 - 0.50 ng/mL            PCT 0.10 - 0.25 ng/mL               OR       >80% decrease in PCT            Discourage initiation of                                            antibiotics      Encourage discontinuation           of antibiotics    ----------------------------     -----------------------------         PCT >= 0.50 ng/mL              PCT 0.26 - 0.50 ng/mL               AND        <80% decrease in PCT             Encourage initiation of                                             antibiotics       Encourage continuation           of antibiotics    ----------------------------     -----------------------------        PCT >= 0.50 ng/mL                  PCT > 0.50 ng/mL               AND         increase in PCT                  Strongly encourage                                      initiation of antibiotics    Strongly encourage escalation  of antibiotics                                      -----------------------------                                           PCT <= 0.25 ng/mL                                                 OR                                        > 80% decrease in PCT                                     Discontinue / Do not initiate                                             antibiotics Performed at [place] [address] [place], [place] [address]   Lactic acid, plasma     Status: Abnormal   Collection Time: 07/11/18  3:42 PM  Result Value Ref Range   Lactic Acid, Venous 3.1 (HH) 0.5 - 1.9 mmol/L    Comment: CRITICAL RESULT CALLED TO, READ BACK BY AND VERIFIED WITH: I.ORAEGBUNAM AT 1640 ON 07/11/18 BY N.THOMPSON Performed at [facility], [place] [address], [place], [place] [address]   Glucose, capillary     Status: Abnormal   Collection Time: 07/11/18  4:41 PM  Result Value Ref Range   Glucose-Capillary 197 (H) 70 - 99 mg/dL  Lactic acid, plasma     Status: Abnormal   Collection Time: 07/11/18  6:48 PM  Result Value Ref Range   Lactic Acid, Venous 2.1 (HH) 0.5 - 1.9 mmol/L    Comment: CRITICAL RESULT CALLED TO, READ BACK BY AND VERIFIED WITH: BLAYDES,E. RN @1929  ON 02.20.2020 BY COHEN,K Performed at [facility], [place] [address] [place], [place] [address]   Lactic acid, plasma     Status: Abnormal   Collection Time: 07/11/18  9:30 PM  Result Value Ref Range   Lactic Acid, Venous 3.9 (HH) 0.5 - 1.9 mmol/L    Comment: CRITICAL RESULT CALLED TO, READ BACK BY AND VERIFIED WITH: E.BLAYDES AT 2259 ON 07/11/18 BY N.THOMPSON Performed at [facility], [place] [address], [place], [place] [address]   Glucose, capillary     Status: Abnormal   Collection Time: 07/11/18  9:56 PM  Result Value Ref Range   Glucose-Capillary 272 (H) 70 - 99 mg/dL  CBC with Differential/Platelet     Status: Abnormal   Collection Time: 07/12/18  4:14 AM  Result Value Ref Range   WBC 15.1 (H) 4.0  - 10.5 K/uL   RBC 3.94 3.87 - 5.11 MIL/uL   Hemoglobin 10.9 (L) 12.0 - 15.0 g/dL  HCT 35.7 (L) 36.0 - 46.0 %   MCV 90.6 80.0 - 100.0 fL   MCH 27.7 26.0 - 34.0 pg   MCHC 30.5 30.0 - 36.0 g/dL   RDW 15.8 (H) 11.5 - 15.5 %   Platelets 403 (H) 150 - 400 K/uL   nRBC 0.2 0.0 - 0.2 %   Neutrophils Relative % 74 %   Neutro Abs 11.1 (H) 1.7 - 7.7 K/uL   Lymphocytes Relative 12 %   Lymphs Abs 1.9 0.7 - 4.0 K/uL   Monocytes Relative 7 %   Monocytes Absolute 1.1 (H) 0.1 - 1.0 K/uL   Eosinophils Relative 1 %   Eosinophils Absolute 0.2 0.0 - 0.5 K/uL   Basophils Relative 1 %   Basophils Absolute 0.1 0.0 - 0.1 K/uL   Immature Granulocytes 5 %   Abs Immature Granulocytes 0.71 (H) 0.00 - 0.07 K/uL    Comment: Performed at [facility], [place] [address] [place], [place] [address]  Comprehensive metabolic panel     Status: Abnormal   Collection Time: 07/12/18  4:14 AM  Result Value Ref Range   Sodium 139 135 - 145 mmol/L   Potassium 2.5 (LL) 3.5 - 5.1 mmol/L    Comment: CRITICAL RESULT CALLED TO, READ BACK BY AND VERIFIED WITH: E BLAYDES,RN 07/12/18 0534 RHOLMES    Chloride 98 98 - 111 mmol/L   CO2 27 22 - 32 mmol/L   Glucose, Bld 163 (H) 70 - 99 mg/dL   BUN 20 8 - 23 mg/dL   Creatinine, Ser 1.21 (H) 0.44 - 1.00 mg/dL   Calcium 7.8 (L) 8.9 - 10.3 mg/dL   Total Protein 6.0 (L) 6.5 - 8.1 g/dL   Albumin 2.5 (L) 3.5 - 5.0 g/dL   AST 25 15 - 41 U/L   ALT 27 0 - 44 U/L   Alkaline Phosphatase 79 38 - 126 U/L   Total Bilirubin 0.5 0.3 - 1.2 mg/dL   GFR calc non Af Amer 42 (L) >60 mL/min   GFR calc Af Amer 48 (L) >60 mL/min   Anion gap 14 5 - 15    Comment: Performed at [facility], [place] [address], [place], [place] [address]  Magnesium     Status: None   Collection Time: 07/12/18  4:14 AM  Result Value Ref Range   Magnesium 1.8 1.7 - 2.4 mg/dL    Comment: Performed at [facility], [place] [address] [place], [place] [address]  Phosphorus      Status: None   Collection Time: 07/12/18  4:14 AM  Result Value Ref Range   Phosphorus 3.3 2.5 - 4.6 mg/dL    Comment: Performed at [facility], [place] [address], [place], [place] [address]  Lactic acid, plasma     Status: None   Collection Time: 07/12/18  6:28 AM  Result Value Ref Range   Lactic Acid, Venous 1.9 0.5 - 1.9 mmol/L    Comment: Performed at [facility], [place] [address] [place], [place] [address]  Glucose, capillary     Status: Abnormal   Collection Time: 07/12/18  7:35 AM  Result Value Ref Range   Glucose-Capillary 167 (H) 70 - 99 mg/dL  Glucose, capillary     Status: Abnormal   Collection Time: 07/12/18 11:58 AM  Result Value Ref Range   Glucose-Capillary 163 (H) 70 - 99 mg/dL  Lactic acid, plasma     Status: Abnormal   Collection Time: 07/12/18  12:42 PM  Result Value Ref Range   Lactic Acid, Venous 4.0 (HH) 0.5 - 1.9 mmol/L    Comment: REPEATED TO VERIFY CRITICAL RESULT CALLED TO, READ BACK BY AND VERIFIED WITH: PICKETT,S.RN AT 1312 07/12/18 MULLINS,T Performed at [facility], [place] [address], [place], [place] [address]   Lactic acid, plasma     Status: Abnormal   Collection Time: 07/12/18  2:07 PM  Result Value Ref Range   Lactic Acid, Venous 4.4 (HH) 0.5 - 1.9 mmol/L    Comment: CRITICAL RESULT CALLED TO, READ BACK BY AND VERIFIED WITH: Missy Sabins. RN @1451  ON 02.21.2020 BY COHEN,K Performed at [facility], [place] [address], [place], [place] [address]   Glucose, capillary     Status: Abnormal   Collection Time: 07/12/18  5:01 PM  Result Value Ref Range   Glucose-Capillary 212 (H) 70 - 99 mg/dL  Lactic acid, plasma     Status: Abnormal   Collection Time: 07/12/18  5:03 PM  Result Value Ref Range   Lactic Acid, Venous 3.1 (HH) 0.5 - 1.9 mmol/L    Comment: CRITICAL RESULT CALLED TO, READ BACK BY AND VERIFIED WITH: PICKETT,S. RN @1735  ON 02.21.2020 BY COHEN,K Performed at [facility], [place] [address] [place], [place] [address]   Culture, blood (routine x 2)     Status: None (Preliminary result)   Collection Time: 07/12/18  7:00 PM  Result Value Ref Range   Specimen Description BLOOD SITE NOT SPECIFIED    Special Requests      BOTTLES DRAWN AEROBIC AND ANAEROBIC Blood Culture adequate volume Performed at [place] [address] [place], [place] [address]    Culture NO GROWTH < 24 HOURS    Report Status PENDING   APTT     Status: Abnormal   Collection Time: 07/12/18  7:15 PM  Result Value Ref Range   aPTT 105 (H) 24 - 36 seconds    Comment:        IF BASELINE aPTT IS ELEVATED, SUGGEST PATIENT RISK ASSESSMENT BE USED TO DETERMINE APPROPRIATE ANTICOAGULANT THERAPY. Performed at [facility], [place] [address] [place], [place] [address]   Protime-INR     Status: Abnormal   Collection Time: 07/12/18  7:15 PM  Result Value Ref Range   Prothrombin Time 16.4 (H) 11.4 - 15.2 seconds   INR 1.33     Comment: Performed at [facility], [place] [address], [place], [place] [address]  Sedimentation rate     Status: Abnormal   Collection Time: 07/12/18  7:15 PM  Result Value Ref Range   Sed Rate 92 (H) 0 - 22 mm/hr    Comment: Performed at [facility], [place] [address] [place], [place] [address]  C-reactive protein     Status: Abnormal   Collection Time: 07/12/18  7:15 PM  Result Value Ref Range   CRP 3.4 (H) <1.0 mg/dL    Comment: Performed at [facility], [place] [address] [place], [place] [address]  Culture, blood (routine x 2)     Status: None (Preliminary result)   Collection Time: 07/12/18  7:16 PM  Result Value Ref Range   Specimen Description BLOOD    Special Requests      BOTTLES DRAWN AEROBIC ONLY Blood Culture adequate volume Performed at [place] [address] [place], [place] [address]    Culture NO GROWTH <  24 HOURS  Report Status PENDING   Glucose, capillary     Status: Abnormal   Collection Time: 07/12/18  8:50 PM  Result Value Ref Range   Glucose-Capillary 160 (H) 70 - 99 mg/dL  Creatinine, serum     Status: Abnormal   Collection Time: 07/13/18  2:34 AM  Result Value Ref Range   Creatinine, Ser 1.17 (H) 0.44 - 1.00 mg/dL   GFR calc non Af Amer 43 (L) >60 mL/min   GFR calc Af Amer 50 (L) >60 mL/min    Comment: Performed at [facility], [place] [address] [place], [place] [address]  CBC     Status: Abnormal   Collection Time: 07/13/18  2:34 AM  Result Value Ref Range   WBC 16.2 (H) 4.0 - 10.5 K/uL   RBC 3.82 (L) 3.87 - 5.11 MIL/uL   Hemoglobin 10.6 (L) 12.0 - 15.0 g/dL   HCT 35.7 (L) 36.0 - 46.0 %   MCV 93.5 80.0 - 100.0 fL   MCH 27.7 26.0 - 34.0 pg   MCHC 29.7 (L) 30.0 - 36.0 g/dL   RDW 15.7 (H) 11.5 - 15.5 %   Platelets 351 150 - 400 K/uL   nRBC 0.0 0.0 - 0.2 %    Comment: Performed at [facility], [place] [address] [place], [place] [address]  Heparin level (unfractionated)     Status: Abnormal   Collection Time: 07/13/18  2:34 AM  Result Value Ref Range   Heparin Unfractionated <0.10 (L) 0.30 - 0.70 IU/mL    Comment: (NOTE) If heparin results are below expected values, and patient dosage has  been confirmed, suggest follow up testing of antithrombin III levels. Performed at [facility], [place] [address] [place], [place] [address]   Glucose, capillary     Status: Abnormal   Collection Time: 07/13/18  7:31 AM  Result Value Ref Range   Glucose-Capillary 169 (H) 70 - 99 mg/dL  Glucose, capillary     Status: Abnormal   Collection Time: 07/13/18 11:12 AM  Result Value Ref Range   Glucose-Capillary 162 (H) 70 - 99 mg/dL  Heparin level (unfractionated)     Status: None   Collection Time: 07/13/18 12:12 PM  Result Value Ref Range   Heparin Unfractionated 0.56 0.30 - 0.70 IU/mL    Comment: (NOTE) If heparin results are  below expected values, and patient dosage has  been confirmed, suggest follow up testing of antithrombin III levels. Performed at [facility], [place] [address] [place], [place] [address]       Component Value Date/Time   SDES BLOOD 07/12/2018 1916   SPECREQUEST  07/12/2018 1916    BOTTLES DRAWN AEROBIC ONLY Blood Culture adequate volume Performed at [facility], [place] [address] [place], [place] [address]    CULT NO GROWTH < 24 HOURS 07/12/2018 1916   REPTSTATUS PENDING 07/12/2018 1916   Ct Head Wo Contrast  Result Date: 07/12/2018 CLINICAL DATA:  Confusion and cephalopathy. History of hypertension and diabetes. EXAM: CT HEAD WITHOUT CONTRAST TECHNIQUE: Contiguous axial images were obtained from the base of the skull through the vertex without intravenous contrast. COMPARISON:  CT HEAD November 09, 2011 FINDINGS: BRAIN: No intraparenchymal hemorrhage, mass effect nor midline shift. No parenchymal brain volume loss for age. No hydrocephalus. Patchy supratentorial white matter hypodensities within normal range for patient's age, though non-specific are most compatible with chronic small vessel ischemic disease. No acute large vascular territory infarcts. No abnormal extra-axial fluid  collections. Basal cisterns are patent. VASCULAR: Moderate calcific atherosclerosis of the carotid siphons. SKULL: No skull fracture. No significant scalp soft tissue swelling. SINUSES/ORBITS: RIGHT maxillary mucosal retention cyst. Mastoid air cells are well aerated.The included ocular globes and orbital contents are non-suspicious. Status post bilateral ocular lens implants. OTHER: 2.1 cm chronic LEFT facial sebaceous cyst. IMPRESSION: Negative non-contrast CT HEAD for age. Electronically Signed   By: Elon Alas M.D.   On: 07/12/2018 16:53   Dg Chest Port 1 View  Result Date: 07/11/2018 CLINICAL DATA:  Hypoxia and cough. EXAM: PORTABLE CHEST 1 VIEW COMPARISON:  06/10/2018  FINDINGS: Left-sided pacemaker unchanged. Lungs are hypoinflated demonstrate worsening airspace opacification over the right perihilar region/medial right base. Mild prominence of the left infrahilar markings. No effusion. Cardiomediastinal silhouette and remainder of the exam is unchanged. IMPRESSION: Worsening opacification over the right infrahilar region/medial right base as well as prominence of the left infrahilar markings. Findings may be due to mild interstitial edema versus infection. Electronically Signed   By: Marin Olp M.D.   On: 07/11/2018 15:45   Ct Angio Chest/abd/pel For Dissection W And/or W/wo  Result Date: 07/12/2018 CLINICAL DATA:  Abdominal pain. EXAM: CT ANGIOGRAPHY CHEST, ABDOMEN AND PELVIS TECHNIQUE: Multidetector CT imaging through the chest, abdomen and pelvis was performed using the standard protocol during bolus administration of intravenous contrast. Multiplanar reconstructed images and MIPs were obtained and reviewed to evaluate the vascular anatomy. CONTRAST:  31mL ISOVUE-370 IOPAMIDOL (ISOVUE-370) INJECTION 76% COMPARISON:  Abdominal CT 07/07/2018 FINDINGS: CTA CHEST FINDINGS Cardiovascular: There is a nonocclusive right pulmonary embolus within the lobar branch of the right upper lobe, extending to the segmental branches. There is a possible smaller nonocclusive pulmonary embolus in a basilar segmental branch in the left lower lobe. There is mild right heart strain with RV/LV ratio of 1.1. Mild atherosclerotic disease of the aorta with no evidence of dissection or aneurysmal dilation. Mediastinum/Nodes: No enlarged mediastinal, hilar, or axillary lymph nodes. Thyroid gland, trachea, and esophagus demonstrate no significant findings. Small hiatal hernia. Lungs/Pleura: Bilateral lower lobe airspace consolidation versus atelectasis. Musculoskeletal: No chest wall abnormality. No acute or significant osseous findings. Review of the MIP images confirms the above findings. CTA  ABDOMEN AND PELVIS FINDINGS VASCULAR Aorta: Normal caliber aorta without aneurysm, dissection, vasculitis or significant stenosis. Celiac: Patent without evidence of aneurysm, dissection, vasculitis or significant stenosis. SMA: Patent without evidence of aneurysm, dissection, vasculitis or significant stenosis. Renals: Both renal arteries are patent without evidence of aneurysm, dissection, vasculitis, fibromuscular dysplasia or significant stenosis. IMA: Patent without evidence of aneurysm, dissection, vasculitis or significant stenosis. Inflow: Patent without evidence of aneurysm, dissection, vasculitis or significant stenosis. Veins: No obvious venous abnormality within the limitations of this arterial phase study. Review of the MIP images confirms the above findings. NON-VASCULAR Hepatobiliary: Hepatic steatosis. Post cholecystectomy. Pancreas: Unremarkable. No pancreatic ductal dilatation or surrounding inflammatory changes. Spleen: Normal in size without focal abnormality. Adrenals/Urinary Tract: Normal adrenal glands. Normal appearance of the left kidney/left ureter and urinary bladder with Foley catheter in place. There is a right hydronephrosis and hydroureter to the level of the distal third of the right ureter. No obstructive calculus or obvious ureteral mass. Stomach/Bowel: Stomach is within normal limits. No evidence of appendicitis. No evidence of bowel wall thickening, distention, or inflammatory changes. Relatively fluid contents of the colon, may be associated with a diarrheal state. Nonspecific gaseous distension of the colon. Scattered colonic diverticula. Lymphatic: Aortic atherosclerosis. No enlarged abdominal or pelvic lymph nodes. Reproductive: Status post  hysterectomy. No adnexal masses. Other: No abdominal wall hernia or abnormality. No abdominopelvic ascites. Musculoskeletal: Scoliosis of the lumbosacral spine. Multilevel osteoarthritic changes. Sclerosis and erosive endplate changes at  C5-E5 centered around the disc space. These changes are out of proportion to the degenerative disc disease seen at the remaining levels of the spine. Review of the MIP images confirms the above findings. IMPRESSION: 1. Nonocclusive right pulmonary embolus within the lobar branch of the right upper lobe, extending to the segmental branches. Possible smaller nonocclusive pulmonary embolus in a basilar segmental branch of the left lower lobe. 2. Mild right heart strain with RV/LV ratio of 1. 3. Bilateral lower lobe airspace consolidation versus atelectasis. 4. Hepatic steatosis. 5. Right hydronephrosis and hydroureter to the level of the distal third of the right ureter. No obstructive calculus or obvious ureteral mass. 6. Sclerosis and erosive endplate changes at I7-P8 centered around the disc space. These changes are out of proportion to the osteoarthritic changes at the remaining levels of the spine. Findings may represent degenerative discogenic changes, however discitis/osteomyelitis may have a similar appearance. 7. Nonspecific gaseous distension of the colon and probable diarrheal state. These results were called by telephone at the time of interpretation on 07/12/2018 at 5:19 pm to Dr. Irene Pap , who verbally acknowledged these results. Electronically Signed   By: Fidela Salisbury M.D.   On: 07/12/2018 17:20   Recent Results (from the past 240 hour(s))  Culture, Urine     Status: Abnormal   Collection Time: 07/07/18  2:48 PM  Result Value Ref Range Status   Specimen Description   Final    URINE, RANDOM Performed at [facility] [address], [place], [place] [address]    Special Requests   Final    NONE Performed at [facility], [place] [address] [place], [place] [address]    Culture 30,000 COLONIES/mL YEAST (A)  Final   Report Status 07/09/2018 FINAL  Final  Culture, blood (routine x 2)     Status: None   Collection Time: 07/07/18  3:40 PM  Result  Value Ref Range Status   Specimen Description RIGHT ANTECUBITAL  Final   Special Requests   Final    BOTTLES DRAWN AEROBIC AND ANAEROBIC Blood Culture adequate volume Performed at [facility] [address] [place], [place] [address]    Culture NO GROWTH 5 DAYS  Final   Report Status 07/12/2018 FINAL  Final  Culture, blood (routine x 2)     Status: None   Collection Time: 07/07/18  3:50 PM  Result Value Ref Range Status   Specimen Description BLOOD RIGHT ARM  Final   Special Requests   Final    BOTTLES DRAWN AEROBIC ONLY Blood Culture adequate volume Performed at [facility], [place] [address] [place], [place] [address]    Culture NO GROWTH 5 DAYS  Final   Report Status 07/12/2018 FINAL  Final  MRSA PCR Screening     Status: Abnormal   Collection Time: 07/09/18 11:43 AM  Result Value Ref Range Status   MRSA by PCR POSITIVE (A) NEGATIVE Final    Comment:        The GeneXpert MRSA Assay (FDA approved for NASAL specimens only), is one component of a comprehensive MRSA colonization surveillance program. It is not intended to diagnose MRSA infection nor to guide or monitor treatment for MRSA infections. RESULT CALLED TO, READ BACK BY AND VERIFIED WITH: C.ELLIS AT 1544 ON 07/09/18  BY N.THOMPSON Performed at [facility], [place] [address], [place], [place] [address]   Culture, blood (routine x 2)     Status: None (Preliminary result)   Collection Time: 07/12/18  7:00 PM  Result Value Ref Range Status   Specimen Description BLOOD SITE NOT SPECIFIED  Final   Special Requests   Final    BOTTLES DRAWN AEROBIC AND ANAEROBIC Blood Culture adequate volume Performed at [facility] [address], [place], [place] [address]    Culture NO GROWTH < 24 HOURS  Final   Report Status PENDING  Incomplete  Culture, blood (routine x 2)     Status: None (Preliminary result)   Collection Time: 07/12/18  7:16 PM  Result  Value Ref Range Status   Specimen Description BLOOD  Final   Special Requests   Final    BOTTLES DRAWN AEROBIC ONLY Blood Culture adequate volume Performed at [facility] [address], [place], [place] [address]    Culture NO GROWTH < 24 HOURS  Final   Report Status PENDING  Incomplete    Microbiology: Recent Results (from the past 240 hour(s))  Culture, Urine     Status: Abnormal   Collection Time: 07/07/18  2:48 PM  Result Value Ref Range Status   Specimen Description   Final    URINE, RANDOM Performed at [facility] [address] [place], [place] [address]    Special Requests   Final    NONE Performed at [facility], [place] [address] [place], [place] [address]    Culture 30,000 COLONIES/mL YEAST (A)  Final   Report Status 07/09/2018 FINAL  Final  Culture, blood (routine x 2)     Status: None   Collection Time: 07/07/18  3:40 PM  Result Value Ref Range Status   Specimen Description RIGHT ANTECUBITAL  Final   Special Requests   Final    BOTTLES DRAWN AEROBIC AND ANAEROBIC Blood Culture adequate volume Performed at [facility] [address] [place], [place] [address]    Culture NO GROWTH 5 DAYS  Final   Report Status 07/12/2018 FINAL  Final  Culture, blood (routine x 2)     Status: None   Collection Time: 07/07/18  3:50 PM  Result Value Ref Range Status   Specimen Description BLOOD RIGHT ARM  Final   Special Requests   Final    BOTTLES DRAWN AEROBIC ONLY Blood Culture adequate volume Performed at [facility], [place] [address], [place], [place] [address]    Culture NO GROWTH 5 DAYS  Final   Report Status 07/12/2018 FINAL  Final  MRSA PCR Screening     Status: Abnormal   Collection Time: 07/09/18 11:43 AM  Result Value Ref Range Status   MRSA by PCR POSITIVE (A) NEGATIVE Final    Comment:        The GeneXpert MRSA Assay (FDA approved for NASAL specimens only), is  one component of a comprehensive MRSA colonization surveillance program. It is not intended to diagnose MRSA infection nor to guide or monitor treatment for MRSA infections. RESULT CALLED TO, READ BACK BY AND VERIFIED WITH: C.ELLIS AT 1544 ON 07/09/18 BY N.THOMPSON Performed at [facility], [place] [address], [place], [place] [address]   Culture, blood (routine x 2)     Status: None (Preliminary result)   Collection Time: 07/12/18  7:00 PM  Result Value Ref Range Status  Specimen Description BLOOD SITE NOT SPECIFIED  Final   Special Requests   Final    BOTTLES DRAWN AEROBIC AND ANAEROBIC Blood Culture adequate volume Performed at [facility] [address] [place], [place] [address]    Culture NO GROWTH < 24 HOURS  Final   Report Status PENDING  Incomplete  Culture, blood (routine x 2)     Status: None (Preliminary result)   Collection Time: 07/12/18  7:16 PM  Result Value Ref Range Status   Specimen Description BLOOD  Final   Special Requests   Final    BOTTLES DRAWN AEROBIC ONLY Blood Culture adequate volume Performed at [facility] [address] [place], [place] [address]    Culture NO GROWTH < 24 HOURS  Final   Report Status PENDING  Incomplete    Radiographs and labs were personally reviewed by me.   Bobby Rumpf, MD [facility] [phone] 07/13/2018, 3:31 PM

## 2018-07-13 NOTE — Progress Notes (Signed)
ANTICOAGULATION CONSULT NOTE - Consult  Pharmacy Consult for Heparin Indication: pulmonary embolus  Assessment: 85 y/o F admitted initially admitted with GIB found to have PE. Per notes, no further hematemtesis. GI signed off 2/19. CBC stable x 3 days.   07/13/2018:  Heparin level (delayed lab processing), 0.42 remains therapeutic on 1400 units/hr  No documented bleeding issues and no infusion issues  CBC stable  Goal of Therapy:  Heparin level 0.3-0.7 units/ml Monitor platelets by anticoagulation protocol: Yes   Plan:   Continue heparin infusion at 1400 units/hr  Daily CBC/HL  Gretta Arab PharmD, BCPS Pager [phone] 07/13/2018 7:15 PM

## 2018-07-13 NOTE — Progress Notes (Signed)
ANTICOAGULATION CONSULT NOTE - Consult  Pharmacy Consult for Heparin Indication: pulmonary embolus  Allergies  Allergen Reactions  . Codeine Other (See Comments)    Per [facility]    Patient Measurements: Height: 5\' 2"  (157.5 cm) Weight: 205 lb 14.6 oz (93.4 kg) IBW/kg (Calculated) : 50.1 Heparin Dosing Weight: 72 kg  Vital Signs: Temp: 97.4 F (36.3 C) (02/21 2055) Temp Source: Oral (02/21 2055) BP: 119/61 (02/21 2055) Pulse Rate: 72 (02/21 2055)  Labs: Recent Labs    07/11/18 0726 07/12/18 0414 07/12/18 1915 07/13/18 0234  HGB 11.1* 10.9*  --  10.6*  HCT 35.9* 35.7*  --  35.7*  PLT 372 403*  --  351  APTT  --   --  105*  --   LABPROT  --   --  16.4*  --   INR  --   --  1.33  --   HEPARINUNFRC  --   --   --  <0.10*  CREATININE 1.17* 1.21*  --  1.17*    Estimated Creatinine Clearance: 39.4 mL/min (A) (by C-G formula based on SCr of 1.17 mg/dL (H)).   Medical History: Past Medical History:  Diagnosis Date  . CHF (congestive heart failure) ([place])   . COPD (chronic obstructive pulmonary disease) ([place])   . Diabetes mellitus without complication ([place])   . GERD (gastroesophageal reflux disease)   . Hypertension   . Hypothyroidism   . Presence of permanent cardiac pacemaker     Assessment: 85 y/o F admitted initially admitted with GIB found to have PE. Per notes, no further hematemtesis. GI signed off 2/19. CBC stable x 3 days.  Today, 2/22 0237 HL <0.10 below goal, Per RN no bleeding issues and no infusion issues (no interuptions and good IV site) CBC stable  Goal of Therapy:  Heparin level 0.3-0.7 units/ml Monitor platelets by anticoagulation protocol: Yes   Plan:  Give 2000 unit IV bolus x1 now Increase heparin drip to 1400 units/hr Daily CBC/HL Check next HL in 8 hours  Lawana Pai R 07/13/2018,3:53 AM

## 2018-07-13 NOTE — Consult Note (Signed)
Chief Complaint: Patient was seen in consultation today for Lumbar 2-3 disc aspiration at the request of Dr Aileen Fass   Supervising Physician: Marybelle Killings  Patient Status: [facility] - In-pt  History of Present Illness: Danielle Dennis is a 85 y.o. female   Pt from [facility] 07/03/18 UCx + Treating now for same General weakness; difficulty ambulating Was to DC 2/13 but was noted to have hypokalemia Again to DC 2/14: developed GI Bleed  Now resolved gi bleed; New PE-- Heparin drip On antibx for PNA BC - x 2  Imaging revealing L2-3 discitis Suspicious for osteo:  Sclerosis and erosive endplate changes at O0-B5 centered around the disc space. These changes are out of proportion to the osteoarthritic changes at the remaining levels of the spine. Findings may represent degenerative discogenic changes, however discitis/osteomyelitis may have a similar appearance.  Pt is having some back pain-- not severe  Request for L2-3 dis aspiration Imaging reviewed with Dr Barbie Banner Approves procedure ( cannot have MRI secondary pacemaker) Discussed with MD-- not emergent Will plan for disc aspiration Mon in IR   Past Medical History:  Diagnosis Date  . CHF (congestive heart failure) ([place])   . COPD (chronic obstructive pulmonary disease) ([place])   . Diabetes mellitus without complication ([place])   . GERD (gastroesophageal reflux disease)   . Hypertension   . Hypothyroidism   . Presence of permanent cardiac pacemaker     History reviewed. No pertinent surgical history.  Allergies: Codeine  Medications: Prior to Admission medications   Medication Sig Start Date End Date Taking? Authorizing Provider  amLODipine (NORVASC) 5 MG tablet Take 5 mg by mouth daily.   Yes [provider]  aspirin 81 MG chewable tablet Chew 81 mg by mouth daily.   Yes [provider]  bumetanide (BUMEX) 2 MG tablet Take 4-6 mg by mouth See admin instructions. Take 6 mg by mouth in the morning and 4 mg at  night   Yes [provider]  estradiol (ESTRACE) 1 MG tablet Take 1 mg by mouth daily.   Yes [provider]  fluticasone furoate-vilanterol (BREO ELLIPTA) 100-25 MCG/INH AEPB Inhale 1 puff into the lungs daily.   Yes [provider]  hydrALAZINE (APRESOLINE) 25 MG tablet Take 25 mg by mouth 3 (three) times daily.   Yes [provider]  HYDROcodone-acetaminophen (NORCO/VICODIN) 5-325 MG tablet Take 1 tablet by mouth 3 (three) times daily.   Yes [provider]  insulin regular (NOVOLIN R,HUMULIN R) 100 units/mL injection Inject 0-10 Units into the skin 4 (four) times daily -  with meals and at bedtime. Per sliding scale: <150 = 0 units 150-200 = 2 units 201-249 = 4 units 250-300 = 6 units 301-349 = 8 units 350-400 = 10 units >400 = Notify MD   Yes [provider]  levothyroxine (SYNTHROID, LEVOTHROID) 125 MCG tablet Take 125 mcg by mouth daily before breakfast.   Yes [provider]  magnesium hydroxide (MILK OF MAGNESIA) 400 MG/5ML suspension Take 30 mLs by mouth daily as needed for mild constipation.   Yes [provider]  metolazone (ZAROXOLYN) 2.5 MG tablet Take 2.5 mg by mouth 2 (two) times a week. On Tuesday and Friday   Yes [provider]  Multiple Vitamins-Minerals (CEROVITE SENIOR) TABS Take 1 tablet by mouth daily.   Yes [provider]  pantoprazole (PROTONIX) 40 MG tablet Take 40 mg by mouth daily.   Yes [provider]  polyvinyl alcohol (LIQUIFILM TEARS)  1.4 % ophthalmic solution Place 1 drop into both eyes at bedtime.   Yes [provider]  potassium chloride SA (K-DUR,KLOR-CON) 20 MEQ tablet Take 40 mEq by mouth 2 (two) times daily.    Yes [provider]  sitaGLIPtin (JANUVIA) 50 MG tablet Take 50 mg by mouth daily.   Yes [provider]     History reviewed. No pertinent family history.  Social History   Socioeconomic History  . Marital status:  Widowed    Spouse name: Not on file  . Number of children: Not on file  . Years of education: Not on file  . Highest education level: Not on file  Occupational History  . Not on file  Social Needs  . Financial resource strain: Not on file  . Food insecurity:    Worry: Never true    Inability: Never true  . Transportation needs:    Medical: No    Non-medical: No  Tobacco Use  . Smoking status: Never Smoker  . Smokeless tobacco: Never Used  Substance and Sexual Activity  . Alcohol use: Not Currently  . Drug use: Not Currently  . Sexual activity: Not Currently  Lifestyle  . Physical activity:    Days per week: Not on file    Minutes per session: Not on file  . Stress: Not on file  Relationships  . Social connections:    Talks on phone: Not on file    Gets together: Not on file    Attends religious service: Not on file    Active member of club or organization: Not on file    Attends meetings of clubs or organizations: Not on file    Relationship status: Not on file  Other Topics Concern  . Not on file  Social History Narrative  . Not on file    Review of Systems: A 12 point ROS discussed and pertinent positives are indicated in the HPI above.  All other systems are negative.  Review of Systems  Constitutional: Positive for activity change, appetite change and fatigue. Negative for fever.  Respiratory: Positive for cough. Negative for shortness of breath.   Cardiovascular: Negative for chest pain.  Musculoskeletal: Positive for back pain and gait problem.  Neurological: Positive for weakness.  Psychiatric/Behavioral: Positive for decreased concentration.    Vital Signs: BP 116/72 (BP Location: Left Wrist)   Pulse 77   Temp 97.6 F (36.4 C) (Oral)   Resp 15   Ht 5\' 2"  (1.575 m)   Wt 205 lb 14.6 oz (93.4 kg)   SpO2 91%   BMI 37.66 kg/m   Physical Exam Vitals signs reviewed.  Constitutional:      Comments: Groggy Sluggish   Cardiovascular:     Rate and  Rhythm: Normal rate and regular rhythm.     Heart sounds: Normal heart sounds.  Pulmonary:     Breath sounds: Wheezing present.  Abdominal:     General: Bowel sounds are normal.  Skin:    General: Skin is warm and dry.  Neurological:     Mental Status: She is alert and oriented to person, place, and time.  Psychiatric:     Comments: Consented with Dtr Joy via phone     Imaging: Ct Abdomen Pelvis Wo Contrast  Result Date: 07/07/2018 CLINICAL DATA:  Brownish bowel movement with vomiting and cough a ground emesis. Leukocytosis to 16.1. Assess for gastrointestinal bleed. EXAM: CT ABDOMEN AND PELVIS WITHOUT CONTRAST TECHNIQUE: Multidetector CT imaging of the abdomen and  pelvis was performed following the standard protocol without IV contrast. COMPARISON:  07/06/2010 FINDINGS: Lower chest: New pulmonary consolidations in both lower lobes with air bronchograms consistent with interval development of bilateral pneumonia and atelectasis. Trace left effusion. Top-normal heart size with out pericardial effusion or thickening. Right atrial and ventricular leads are present. Hepatobiliary: The gallbladder is surgically absent. The unenhanced liver is unremarkable. Pancreas: Atrophic pancreas without mass or ductal dilatation. No inflammation. Spleen: Normal Adrenals/Urinary Tract: Normal bilateral adrenal glands. The left kidney is unremarkable. Moderate marked right-sided hydroureteronephrosis and hydroureter is again visualized extending to the pelvic brim where there is a transition in size and caliber of the ureter question stricture. No calculus is identified. No apparent intraluminal mass. Stomach is decompressed by Foley catheter. Stomach/Bowel: Large amount of retained stool is seen within the descending colon through rectosigmoid with transmural thickening suggestive fecal impaction and possible stercoral colitis. Small hiatal hernia. Contrast filled physiologic distention of the stomach. The duodenal  sweep and ligament of Treitz are normal. No small bowel obstruction. The appendix is not confidently identified. Vascular/Lymphatic: Aortoiliac atherosclerosis. No aneurysm. No adenopathy. Scattered pelvic phleboliths are noted. Reproductive: Status post hysterectomy. No adnexal masses. Other: No free air nor free fluid. No hernia. Musculoskeletal: Degenerative disc disease and scoliosis of the lumbar spine. No aggressive osseous lesions. IMPRESSION: 1. New bilateral lower lobe pulmonary consolidations with air bronchograms consistent with interval development of bilateral pneumonia and atelectasis. Trace left effusion. 2. Moderate to marked right-sided hydroureteronephrosis and hydroureter extending nor definite ureteral mass. No significant change from prior. Is identified. 3. Large amount of retained stool within the descending colon through rectosigmoid with slight transmural thickening suggestive of fecal impaction and possible stercoral colitis. 4. Aortoiliac atherosclerosis. Electronically Signed   By: Ashley Royalty M.D.   On: 07/07/2018 20:14   Ct Head Wo Contrast  Result Date: 07/12/2018 CLINICAL DATA:  Confusion and cephalopathy. History of hypertension and diabetes. EXAM: CT HEAD WITHOUT CONTRAST TECHNIQUE: Contiguous axial images were obtained from the base of the skull through the vertex without intravenous contrast. COMPARISON:  CT HEAD November 09, 2011 FINDINGS: BRAIN: No intraparenchymal hemorrhage, mass effect nor midline shift. No parenchymal brain volume loss for age. No hydrocephalus. Patchy supratentorial white matter hypodensities within normal range for patient's age, though non-specific are most compatible with chronic small vessel ischemic disease. No acute large vascular territory infarcts. No abnormal extra-axial fluid collections. Basal cisterns are patent. VASCULAR: Moderate calcific atherosclerosis of the carotid siphons. SKULL: No skull fracture. No significant scalp soft tissue  swelling. SINUSES/ORBITS: RIGHT maxillary mucosal retention cyst. Mastoid air cells are well aerated.The included ocular globes and orbital contents are non-suspicious. Status post bilateral ocular lens implants. OTHER: 2.1 cm chronic LEFT facial sebaceous cyst. IMPRESSION: Negative non-contrast CT HEAD for age. Electronically Signed   By: Elon Alas M.D.   On: 07/12/2018 16:53   Dg Chest Port 1 View  Result Date: 07/11/2018 CLINICAL DATA:  Hypoxia and cough. EXAM: PORTABLE CHEST 1 VIEW COMPARISON:  06/10/2018 FINDINGS: Left-sided pacemaker unchanged. Lungs are hypoinflated demonstrate worsening airspace opacification over the right perihilar region/medial right base. Mild prominence of the left infrahilar markings. No effusion. Cardiomediastinal silhouette and remainder of the exam is unchanged. IMPRESSION: Worsening opacification over the right infrahilar region/medial right base as well as prominence of the left infrahilar markings. Findings may be due to mild interstitial edema versus infection. Electronically Signed   By: Marin Olp M.D.   On: 07/11/2018 15:45   Ct Angio Chest/abd/pel  For Dissection W And/or W/wo  Result Date: 07/12/2018 CLINICAL DATA:  Abdominal pain. EXAM: CT ANGIOGRAPHY CHEST, ABDOMEN AND PELVIS TECHNIQUE: Multidetector CT imaging through the chest, abdomen and pelvis was performed using the standard protocol during bolus administration of intravenous contrast. Multiplanar reconstructed images and MIPs were obtained and reviewed to evaluate the vascular anatomy. CONTRAST:  95mL ISOVUE-370 IOPAMIDOL (ISOVUE-370) INJECTION 76% COMPARISON:  Abdominal CT 07/07/2018 FINDINGS: CTA CHEST FINDINGS Cardiovascular: There is a nonocclusive right pulmonary embolus within the lobar branch of the right upper lobe, extending to the segmental branches. There is a possible smaller nonocclusive pulmonary embolus in a basilar segmental branch in the left lower lobe. There is mild right heart  strain with RV/LV ratio of 1.1. Mild atherosclerotic disease of the aorta with no evidence of dissection or aneurysmal dilation. Mediastinum/Nodes: No enlarged mediastinal, hilar, or axillary lymph nodes. Thyroid gland, trachea, and esophagus demonstrate no significant findings. Small hiatal hernia. Lungs/Pleura: Bilateral lower lobe airspace consolidation versus atelectasis. Musculoskeletal: No chest wall abnormality. No acute or significant osseous findings. Review of the MIP images confirms the above findings. CTA ABDOMEN AND PELVIS FINDINGS VASCULAR Aorta: Normal caliber aorta without aneurysm, dissection, vasculitis or significant stenosis. Celiac: Patent without evidence of aneurysm, dissection, vasculitis or significant stenosis. SMA: Patent without evidence of aneurysm, dissection, vasculitis or significant stenosis. Renals: Both renal arteries are patent without evidence of aneurysm, dissection, vasculitis, fibromuscular dysplasia or significant stenosis. IMA: Patent without evidence of aneurysm, dissection, vasculitis or significant stenosis. Inflow: Patent without evidence of aneurysm, dissection, vasculitis or significant stenosis. Veins: No obvious venous abnormality within the limitations of this arterial phase study. Review of the MIP images confirms the above findings. NON-VASCULAR Hepatobiliary: Hepatic steatosis. Post cholecystectomy. Pancreas: Unremarkable. No pancreatic ductal dilatation or surrounding inflammatory changes. Spleen: Normal in size without focal abnormality. Adrenals/Urinary Tract: Normal adrenal glands. Normal appearance of the left kidney/left ureter and urinary bladder with Foley catheter in place. There is a right hydronephrosis and hydroureter to the level of the distal third of the right ureter. No obstructive calculus or obvious ureteral mass. Stomach/Bowel: Stomach is within normal limits. No evidence of appendicitis. No evidence of bowel wall thickening, distention, or  inflammatory changes. Relatively fluid contents of the colon, may be associated with a diarrheal state. Nonspecific gaseous distension of the colon. Scattered colonic diverticula. Lymphatic: Aortic atherosclerosis. No enlarged abdominal or pelvic lymph nodes. Reproductive: Status post hysterectomy. No adnexal masses. Other: No abdominal wall hernia or abnormality. No abdominopelvic ascites. Musculoskeletal: Scoliosis of the lumbosacral spine. Multilevel osteoarthritic changes. Sclerosis and erosive endplate changes at X3-K4 centered around the disc space. These changes are out of proportion to the degenerative disc disease seen at the remaining levels of the spine. Review of the MIP images confirms the above findings. IMPRESSION: 1. Nonocclusive right pulmonary embolus within the lobar branch of the right upper lobe, extending to the segmental branches. Possible smaller nonocclusive pulmonary embolus in a basilar segmental branch of the left lower lobe. 2. Mild right heart strain with RV/LV ratio of 1. 3. Bilateral lower lobe airspace consolidation versus atelectasis. 4. Hepatic steatosis. 5. Right hydronephrosis and hydroureter to the level of the distal third of the right ureter. No obstructive calculus or obvious ureteral mass. 6. Sclerosis and erosive endplate changes at M0-N0 centered around the disc space. These changes are out of proportion to the osteoarthritic changes at the remaining levels of the spine. Findings may represent degenerative discogenic changes, however discitis/osteomyelitis may have a similar appearance. 7. Nonspecific  gaseous distension of the colon and probable diarrheal state. These results were called by telephone at the time of interpretation on 07/12/2018 at 5:19 pm to Dr. Irene Pap , who verbally acknowledged these results. Electronically Signed   By: Fidela Salisbury M.D.   On: 07/12/2018 17:20    Labs:  CBC: Recent Labs    07/10/18 0420 07/11/18 0726 07/12/18 0414  07/13/18 0234  WBC 14.2* 14.7* 15.1* 16.2*  HGB 10.2* 11.1* 10.9* 10.6*  HCT 32.7* 35.9* 35.7* 35.7*  PLT 298 372 403* 351    COAGS: Recent Labs    07/12/18 1915  INR 1.33  APTT 105*    BMP: Recent Labs    07/08/18 0448 07/10/18 0420 07/11/18 0726 07/12/18 0414 07/13/18 0234  NA 135 138 141 139  --   K 3.4* 2.0* 2.3* 2.5*  --   CL 103 98 98 98  --   CO2 23 28 29 27   --   GLUCOSE 258* 208* 221* 163*  --   BUN 27* 12 15 20   --   CALCIUM 8.5* 8.4* 8.3* 7.8*  --   CREATININE 1.00 0.95 1.17* 1.21* 1.17*  GFRNONAA 52* 56* 43* 42* 43*  GFRAA >60 >60 50* 48* 50*    LIVER FUNCTION TESTS: Recent Labs    07/07/18 1347 07/08/18 0448 07/10/18 0420 07/12/18 0414  BILITOT 0.6 0.5 0.5 0.5  AST 18 17 21 25   ALT 16 16 23 27   ALKPHOS 72 67 75 79  PROT 5.8* 5.5* 5.9* 6.0*  ALBUMIN 2.6* 2.4* 2.3* 2.5*    TUMOR MARKERS: No results for input(s): AFPTM, CEA, CA199, CHROMGRNA in the last 8760 hours.  Assessment and Plan:  Back pain- not severe CT imaging revealing diskitis For Lumbar 2-3 disc aspiration Mon in IR Will turn off Hep 3 hrs before procedure (+PE) Risks and benefits of L2-3 disc aspiration was discussed with the patient's daughter Danielle Dennis via phone  including, but not limited to bleeding, infection, damage to adjacent structures or low yield requiring additional tests.  All of the questions were answered and there is agreement to proceed. Consent signed and in chart.   Thank you for this interesting consult.  I greatly enjoyed meeting Danielle Dennis and look forward to participating in their care.  A copy of this report was sent to the requesting provider on this date.  Electronically Signed: Lavonia Drafts, PA-C 07/13/2018, 1:55 PM   I spent a total of 40 Minutes    in face to face in clinical consultation, greater than 50% of which was counseling/coordinating care for L2-3 disc aspiration

## 2018-07-14 DIAGNOSIS — M545 Low back pain: Secondary | ICD-10-CM

## 2018-07-14 DIAGNOSIS — M4646 Discitis, unspecified, lumbar region: Secondary | ICD-10-CM

## 2018-07-14 LAB — CBC
HCT: 31.9 % — ABNORMAL LOW (ref 36.0–46.0)
Hemoglobin: 9.5 g/dL — ABNORMAL LOW (ref 12.0–15.0)
MCH: 27.2 pg (ref 26.0–34.0)
MCHC: 29.8 g/dL — ABNORMAL LOW (ref 30.0–36.0)
MCV: 91.4 fL (ref 80.0–100.0)
Platelets: 430 10*3/uL — ABNORMAL HIGH (ref 150–400)
RBC: 3.49 MIL/uL — ABNORMAL LOW (ref 3.87–5.11)
RDW: 15.8 % — ABNORMAL HIGH (ref 11.5–15.5)
WBC: 11.4 10*3/uL — ABNORMAL HIGH (ref 4.0–10.5)
nRBC: 0 % (ref 0.0–0.2)

## 2018-07-14 LAB — BASIC METABOLIC PANEL
Anion gap: 10 (ref 5–15)
Anion gap: 8 (ref 5–15)
BUN: 14 mg/dL (ref 8–23)
BUN: 11 mg/dL (ref 8–23)
CO2: 24 mmol/L (ref 22–32)
CO2: 25 mmol/L (ref 22–32)
Calcium: 7.9 mg/dL — ABNORMAL LOW (ref 8.9–10.3)
Calcium: 8.2 mg/dL — ABNORMAL LOW (ref 8.9–10.3)
Chloride: 105 mmol/L (ref 98–111)
Chloride: 105 mmol/L (ref 98–111)
Creatinine, Ser: 0.95 mg/dL (ref 0.44–1.00)
Creatinine, Ser: 0.96 mg/dL (ref 0.44–1.00)
GFR calc Af Amer: >60 mL/min (ref >60)
GFR calc Af Amer: >60 mL/min (ref >60)
GFR calc non Af Amer: 56 mL/min — ABNORMAL LOW (ref >60)
GFR calc non Af Amer: 55 mL/min — ABNORMAL LOW (ref >60)
Glucose, Bld: 168 mg/dL — ABNORMAL HIGH (ref 70–99)
Glucose, Bld: 189 mg/dL — ABNORMAL HIGH (ref 70–99)
Potassium: 2.5 mmol/L — CL (ref 3.5–5.1)
Potassium: 2.8 mmol/L — ABNORMAL LOW (ref 3.5–5.1)
Sodium: 139 mmol/L (ref 135–145)
Sodium: 138 mmol/L (ref 135–145)

## 2018-07-14 LAB — GLUCOSE, CAPILLARY
GLUCOSE-CAPILLARY: 156 mg/dL — AB (ref 70–99)
Glucose-Capillary: 153 mg/dL — ABNORMAL HIGH (ref 70–99)
Glucose-Capillary: 181 mg/dL — ABNORMAL HIGH (ref 70–99)
Glucose-Capillary: 150 mg/dL — ABNORMAL HIGH (ref 70–99)

## 2018-07-14 LAB — HEPARIN LEVEL (UNFRACTIONATED): Heparin Unfractionated: 0.42 IU/mL (ref 0.30–0.70)

## 2018-07-14 LAB — MAGNESIUM: Magnesium: 2.2 mg/dL (ref 1.7–2.4)

## 2018-07-14 MED ORDER — POTASSIUM CHLORIDE CRYS ER 20 MEQ PO TBCR
40.0000 meq | EXTENDED_RELEASE_TABLET | Freq: Three times a day (TID) | ORAL | Status: AC
  Administered 2018-07-14 – 2018-07-15 (×3): 40 meq via ORAL
  Filled 2018-07-14 (×3): qty 2

## 2018-07-14 MED ORDER — POTASSIUM CHLORIDE IN NACL 20-0.45 MEQ/L-% IV SOLN
INTRAVENOUS | Status: AC
  Administered 2018-07-14 – 2018-07-15 (×2): via INTRAVENOUS
  Filled 2018-07-14 (×2): qty 1000

## 2018-07-14 MED ORDER — VANCOMYCIN HCL 10 G IV SOLR
1250.0000 mg | INTRAVENOUS | Status: DC
  Administered 2018-07-14 – 2018-07-18 (×5): 1250 mg via INTRAVENOUS
  Filled 2018-07-14 (×5): qty 1250

## 2018-07-14 NOTE — Progress Notes (Signed)
PROGRESS NOTE  Savita Runner VZC:[id number] DOB: 29-Sep-1933 DOA: 07/07/2018 PCP: Emmaline Kluver, MD  HPI/Recap of past 24 hours: 85 y.o.femalewith medical history significant ofHFpEF, COPD, T2DM, HTN, Hypothyroidism, ESBL e coli bacteremia and multiple other medical problems who presented to [facility] initially with concern for the "foley not draining right all day" on 07/03/18. Pt also noted generalized weakness and difficulty ambulating. Urine cx from 06/29/2018 showed ESBL E. Coli and pan sensitive E faecium. Pt follows with Dr. Emi Holes (urology, who had started pt on ceftin and then subsequently changed to augmentin). Initially plan was for observation and d/c to SNF on 2/13, but she had severe hypokalemia to 2.1 and acute kidney injury. On 2/14, plan for d/c to SNF, but pt had large bowel movement with blood. Pt was monitored another night and did not have any additional episdoes for blood in stool or black stool, but then started having episodes of coffee-ground low volume emesis.  GI was consulted.  No further signs of hematemesis and rectal bleeding probably from stercoral colitis.  GI signed off on 07/10/2018.  Hospital course complicated by pulmonary embolism, clinically undetermined if chronic, and suspected discitis at L2-L3.   07/14/18: Persistent hypokalemia despite repletion.  Denies chest pain, palpitations or dyspnea at rest.  Will add potassium 20 mEq to IV fluid half-normal saline at 75 cc/h, continue oral potassium replacement and repeat BMP at 1700.  Assessment/Plan: Principal Problem:   GI bleed Active Problems:   UTI due to extended-spectrum beta lactamase (ESBL) producing Escherichia coli   (HFpEF) heart failure with preserved ejection fraction (HCC)   T2DM (type 2 diabetes mellitus) ([place])   Resolved upper GI bleed:  GI consulted and signed off on 07/10/2018 -PPI BID -GI consulted. No longer plans for endoscopy as pt seems to be improved from a GI  standpoint -No sign of overt bleeding -No recurrence of GI bleed noted in the last 48 hours.  R Pulmonary embolism with mild right strain, clinically undetermined if chronic Positive CTA PE done on 07/12/2018 independently reviewed Continue hep drip, dosed by pharmacy Maintain O2 sat >92%  Acute on chronic hypoxic respiratory failure suspect secondary to PE vs bilateral HCAP  Reports on 2 L of oxygen as needed at baseline Now requiring 4 L to maintain O2 saturation greater than 90% Maintain O2 saturation 92% or greater Close monitoring of vital signs on telemetry Treat PE and HCAP  Suspected L2-L3 discitis/osteomyelitis ESR and CRP elevated Obtain L2-L3 aspiration by interventional radiology Procedure planned for Monday, 07/15/2018 Continue IV antibiotics empirically  On IV vancomycin and Zosyn day #4 ID consulted and following  Persistent hypokalemia despite repletion Potassium 2.5 on 07/14/2018 Add IV potassium 20 mEq to IV fluid half-normal saline at 75 cc per hour Continue oral KCl replacement 40 mEq every 6 hours Repeat BMP this afternoon at 1700  Candiduria in the setting of indwelling Foley catheter, removed on 07/13/2018 Reports suprapubic pain on 07/12/18 Received 2 doses of Diflucan  DC Diflucan per ID recs on 07/13/18 Remove Foley catheter on 07/13/2018 Unclear/questionable history of urinary retention with indwelling Foley catheter Closely monitor urine output after removing Foley catheter Has been urinating without any difficulty 900 cc urine output recorded in the last 24 hours  Bilateral HCAP Continue IV antibiotics IV vanc and Zosyn day number 4 Maintain O2 saturation greater than 92%  Lactic acidosis 2/2 R sided PE Lactic acid peaked at >4.0 Now trending down  Leukocytosis, multifactorial Reactive in the setting of PE  vs HCAP Vs suspected L2-L3 osteomyelitis/discitis Elevated lactic acid  WBC 15k from 14k Continue IV Zosyn and IV vanc empirically Repeat  CBC in the morning  Urinary Tract Infection Indwelling Foley Catheter: -Of note, she recently had ESBL bacteremia in January, treated with 7 days ertapenem. -Urine cx on 2/8 and on 2/11 positive for ESBL e. Coli and enterococcus faecium -Augmentin per Dr. Emi Holes (urology) at OSH. Started on ertapenem at presentation at OSH, but transitioned back to augmentin.  Hypomagnesemia, resolved post repletion  MRSA positive Suspect from colonization Blood culture negative to date x2  Type 2 diabetes uncontrolled with hyperglycemia Hemoglobin A1c 8.1 on 07/11/2018 Continue insulin sliding scale Hold Januvia  Abdominal Discomfort suspect secondary to possible stercoral colitis: -large stool on CT abd -Required manual disimpaction. Continue cathartics as tolerated -Reports having bowel movements  AKI Baseline creatinine appears to be 0.9 Latest creatinine 1.21 Continue IV fluid hydration Avoid nephrotoxic agents/dehydration/hypotension Repeat BMP in the morning  Resolved acute diabetic encephalopathy   Generalized Weakness and Deconditioning: PT assessed and recommended SNF  Hydronephrosis: CT urogram on 06/29/2018 showed mild to moderate R hydroureteronehrosis which terminates at the junction of middle and distal thirds of the R ureter. No urinary tract calculi. The possibility of a ureteral stricture or urothelial lesion in the ureter should be considered. Urologic consultation is recommended. These findings could be better evaluated with follow up hematuria protocal CT scan if clinically appropriate.  Hypertension:holdingamlodipine, hydralazinefor now -BP stable and controlled at present  Hypothyroidism: Continue synthroid as tolerated  GERD: PPI as noted above  Chronic Diastolic XV:QMGQ Bumex.  Continue gentle IV fluid hydration half-normal saline at 50 cc/h.  Hormone Replacement Therapy: currently holding estradiol. Consider discontinuing given risk with  continued use at her age and new pulmonary embolism.  Physical debility/ambulatory dysfunction PT OT recommending SNF Fall precautions CSW consulted for placement  DVT prophylaxis:  Subcu Lovenox daily Code Status: DNR Family Communication:  None at bedside Disposition Plan:  SNF when hemodynamically stable and a bed is available  Consultants:   GI  Infectious disease    Antimicrobials:             Anti-infectives (From admission, onward)     Start        Dose/Rate  Route  Frequency  Ordered  Stop     07/07/18 1600    ertapenem (INVANZ) 1,000 mg in sodium chloride 0.9 % 100 mL IVPB       1 g  200 mL/hr over 30 Minutes  Intravenous  Every 24 hours  07/07/18 1431            Objective: Vitals:   07/13/18 2020 07/13/18 2114 07/14/18 0549 07/14/18 0929  BP: 124/60  129/76   Pulse: 67  79   Resp: 16  18   Temp: 98.2 F (36.8 C)  97.7 F (36.5 C)   TempSrc: Oral  Oral   SpO2: 94% 94% 96% 95%  Weight:   95.2 kg   Height:        Intake/Output Summary (Last 24 hours) at 07/14/2018 1216 Last data filed at 07/14/2018 1100 Gross per 24 hour  Intake 2517.4 ml  Output 700 ml  Net 1817.4 ml   Filed Weights   07/10/18 0500 07/12/18 0550 07/14/18 0549  Weight: 96 kg 93.4 kg 95.2 kg    Exam:  . General: 85 y.o. year-old female well-developed well-nourished in no acute distress.  Alert and oriented x3. . Cardiovascular: Regular rate and rhythm  with no rubs or gallops.  No JVD or thyromegaly . Respiratory: Clear to auscultation with no wheezes or rales.  Poor inspiratory effort. . Abdomen: Soft mildly tender diffusely with normal bowel sounds x4 quadrants. . Musculoskeletal: Trace lower extremity edema. 2/4 pulses in all 4 extremities. Marland Kitchen Psychiatry: Mood is appropriate for condition and setting   Data Reviewed: CBC: Recent Labs  Lab 07/10/18 0420 07/11/18 0726 07/12/18 0414 07/13/18 0234 07/14/18 0502  WBC 14.2* 14.7* 15.1* 16.2*  11.4*  NEUTROABS  --  11.6* 11.1*  --   --   HGB 10.2* 11.1* 10.9* 10.6* 9.5*  HCT 32.7* 35.9* 35.7* 35.7* 31.9*  MCV 89.6 90.4 90.6 93.5 91.4  PLT 298 372 403* 351 643*   Basic Metabolic Panel: Recent Labs  Lab 07/10/18 0420 07/11/18 0726 07/12/18 0414 07/13/18 0234 07/13/18 1613 07/14/18 0502  NA 138 141 139  --  139 139  K 2.0* 2.3* 2.5*  --  2.6* 2.5*  CL 98 98 98  --  102 105  CO2 '28 29 27  '$ --  25 24  GLUCOSE 208* 221* 163*  --  159* 168*  BUN '12 15 20  '$ --  17 14  CREATININE 0.95 1.17* 1.21* 1.17* 1.08* 0.95  CALCIUM 8.4* 8.3* 7.8*  --  7.9* 7.9*  MG 1.5* 1.4* 1.8  --   --  2.2  PHOS  --   --  3.3  --   --   --    GFR: Estimated Creatinine Clearance: 49.1 mL/min (by C-G formula based on SCr of 0.95 mg/dL). Liver Function Tests: Recent Labs  Lab 07/07/18 1347 07/08/18 0448 07/10/18 0420 07/12/18 0414  AST '18 17 21 25  '$ ALT '16 16 23 27  '$ ALKPHOS 72 67 75 79  BILITOT 0.6 0.5 0.5 0.5  PROT 5.8* 5.5* 5.9* 6.0*  ALBUMIN 2.6* 2.4* 2.3* 2.5*   No results for input(s): LIPASE, AMYLASE in the last 168 hours. Recent Labs  Lab 07/07/18 1423  AMMONIA 13   Coagulation Profile: Recent Labs  Lab 07/12/18 1915  INR 1.33   Cardiac Enzymes: No results for input(s): CKTOTAL, CKMB, CKMBINDEX, TROPONINI in the last 168 hours. BNP (last 3 results) No results for input(s): PROBNP in the last 8760 hours. HbA1C: No results for input(s): HGBA1C in the last 72 hours. CBG: Recent Labs  Lab 07/13/18 1112 07/13/18 1620 07/13/18 2016 07/14/18 0753 07/14/18 1145  GLUCAP 162* 203* 163* 153* 156*   Lipid Profile: No results for input(s): CHOL, HDL, LDLCALC, TRIG, CHOLHDL, LDLDIRECT in the last 72 hours. Thyroid Function Tests: No results for input(s): TSH, T4TOTAL, FREET4, T3FREE, THYROIDAB in the last 72 hours. Anemia Panel: No results for input(s): VITAMINB12, FOLATE, FERRITIN, TIBC, IRON, RETICCTPCT in the last 72 hours. Urine analysis:    Component Value Date/Time     COLORURINE YELLOW 07/07/2018 1448   APPEARANCEUR CLOUDY (A) 07/07/2018 1448   LABSPEC 1.012 07/07/2018 1448   PHURINE 5.0 07/07/2018 1448   GLUCOSEU NEGATIVE 07/07/2018 1448   HGBUR SMALL (A) 07/07/2018 1448   BILIRUBINUR NEGATIVE 07/07/2018 1448   KETONESUR NEGATIVE 07/07/2018 1448   PROTEINUR NEGATIVE 07/07/2018 1448   NITRITE NEGATIVE 07/07/2018 1448   LEUKOCYTESUR LARGE (A) 07/07/2018 1448   Sepsis Labs: '@LABRCNTIP'$ (procalcitonin:4,lacticidven:4)  ) Recent Results (from the past 240 hour(s))  Culture, Urine     Status: Abnormal   Collection Time: 07/07/18  2:48 PM  Result Value Ref Range Status   Specimen Description   Final  URINE, RANDOM Performed at [facility], [place] [address] [place], [place] [address]    Special Requests   Final    NONE Performed at [facility], [place] [address], [place], [place] [address]    Culture 30,000 COLONIES/mL YEAST (A)  Final   Report Status 07/09/2018 FINAL  Final  Culture, blood (routine x 2)     Status: None   Collection Time: 07/07/18  3:40 PM  Result Value Ref Range Status   Specimen Description RIGHT ANTECUBITAL  Final   Special Requests   Final    BOTTLES DRAWN AEROBIC AND ANAEROBIC Blood Culture adequate volume Performed at [place] [address], [place], [place] [address]    Culture NO GROWTH 5 DAYS  Final   Report Status 07/12/2018 FINAL  Final  Culture, blood (routine x 2)     Status: None   Collection Time: 07/07/18  3:50 PM  Result Value Ref Range Status   Specimen Description BLOOD RIGHT ARM  Final   Special Requests   Final    BOTTLES DRAWN AEROBIC ONLY Blood Culture adequate volume Performed at [facility], [place] [address], [place], [place] [address]    Culture NO GROWTH 5 DAYS  Final   Report Status 07/12/2018 FINAL  Final  MRSA PCR Screening     Status: Abnormal   Collection Time: 07/09/18 11:43 AM  Result Value Ref Range  Status   MRSA by PCR POSITIVE (A) NEGATIVE Final    Comment:        The GeneXpert MRSA Assay (FDA approved for NASAL specimens only), is one component of a comprehensive MRSA colonization surveillance program. It is not intended to diagnose MRSA infection nor to guide or monitor treatment for MRSA infections. RESULT CALLED TO, READ BACK BY AND VERIFIED WITH: C.ELLIS AT 1544 ON 07/09/18 BY N.THOMPSON Performed at [facility], [place] [address] [place], [place] [address]   Culture, blood (routine x 2)     Status: None (Preliminary result)   Collection Time: 07/12/18  7:00 PM  Result Value Ref Range Status   Specimen Description BLOOD SITE NOT SPECIFIED  Final   Special Requests   Final    BOTTLES DRAWN AEROBIC AND ANAEROBIC Blood Culture adequate volume Performed at [place] [address] [place], [place] [address]    Culture NO GROWTH 2 DAYS  Final   Report Status PENDING  Incomplete  Culture, blood (routine x 2)     Status: None (Preliminary result)   Collection Time: 07/12/18  7:16 PM  Result Value Ref Range Status   Specimen Description BLOOD  Final   Special Requests   Final    BOTTLES DRAWN AEROBIC ONLY Blood Culture adequate volume Performed at [place] [address] [place], [place] [address]    Culture NO GROWTH 2 DAYS  Final   Report Status PENDING  Incomplete      Studies: No results found.  Scheduled Meds: . arformoterol  15 mcg Nebulization BID  . budesonide (PULMICORT) nebulizer solution  0.25 mg Nebulization BID  . feeding supplement  1 Container Oral TID BM  . insulin aspart  0-5 Units Subcutaneous QHS  . insulin aspart  0-9 Units Subcutaneous TID WC  . levothyroxine  125 mcg Oral Q0600  . lidocaine  1 patch Transdermal Q24H  . mouth rinse  15 mL Mouth Rinse BID  . nystatin   Topical TID  .  pantoprazole  40 mg Oral BID AC  . polyvinyl alcohol  1 drop Both Eyes QHS  . potassium  chloride  40 mEq Oral Q6H    Continuous Infusions: . 0.45 % NaCl with KCl 20 mEq / L    . sodium chloride 250 mL (07/09/18 1702)  . heparin 1,400 Units/hr (07/14/18 0356)  . piperacillin-tazobactam (ZOSYN)  IV 3.375 g (07/14/18 0917)  . vancomycin       LOS: 7 days     Kayleen Memos, MD [facility] Pager [phone]  If 7PM-7AM, please contact night-coverage [email or web address] Password [email or web address] 07/14/2018, 12:16 PM

## 2018-07-14 NOTE — Progress Notes (Signed)
ANTICOAGULATION CONSULT NOTE - Consult  Pharmacy Consult for Heparin Indication: pulmonary embolus  Allergies  Allergen Reactions  . Codeine Other (See Comments)    Per [facility]    Patient Measurements: Height: 5\' 2"  (951.8 cm) Weight: 209 lb 14.1 oz (95.2 kg) IBW/kg (Calculated) : 50.1 Heparin Dosing Weight: 72 kg  Vital Signs: Temp: 97.7 F (36.5 C) (02/23 0549) Temp Source: Oral (02/23 0549) BP: 129/76 (02/23 0549) Pulse Rate: 79 (02/23 0549)  Labs: Recent Labs    07/12/18 0414 07/12/18 1915  07/13/18 0234 07/13/18 1212 07/13/18 1613 07/13/18 1830 07/14/18 0502  HGB 10.9*  --   --  10.6*  --   --   --  9.5*  HCT 35.7*  --   --  35.7*  --   --   --  31.9*  PLT 403*  --   --  351  --   --   --  430*  APTT  --  105*  --   --   --   --   --   --   LABPROT  --  16.4*  --   --   --   --   --   --   INR  --  1.33  --   --   --   --   --   --   HEPARINUNFRC  --   --    < > <0.10* 0.56  --  0.42 0.42  CREATININE 1.21*  --   --  1.17*  --  1.08*  --  0.95   < > = values in this interval not displayed.    Estimated Creatinine Clearance: 49.1 mL/min (by C-G formula based on SCr of 0.95 mg/dL).   Medical History: Past Medical History:  Diagnosis Date  . CHF (congestive heart failure) ([place])   . COPD (chronic obstructive pulmonary disease) ([place])   . Diabetes mellitus without complication ([place])   . GERD (gastroesophageal reflux disease)   . Hypertension   . Hypothyroidism   . Presence of permanent cardiac pacemaker     Assessment: 85 y/o F admitted initially admitted with GIB found to have PE. Per notes, no further hematemtesis. GI signed off 2/19. CBC stable x 3 days.   07/14/2018:  Heparin level at goal (0.42) on 1400 units/hr  Per RN no bleeding issues and no infusion issues  CBC: Slight decrease in Hg, Pltc stable  Goal of Therapy:  Heparin level 0.3-0.7 units/ml Monitor platelets by anticoagulation protocol: Yes   Plan:  Continue heparin infusion at 1400  units/hr Daily CBC/HL F/U plans to hold heparin around IR aspirate & long-term AC plans   Biagio Borg 07/14/2018,8:20 AM

## 2018-07-14 NOTE — Progress Notes (Signed)
INFECTIOUS DISEASE PROGRESS NOTE  ID: Danielle Dennis is a 85 y.o. female with  Principal Problem:   GI bleed Active Problems:   UTI due to extended-spectrum beta lactamase (ESBL) producing Escherichia coli   (HFpEF) heart failure with preserved ejection fraction (HCC)   T2DM (type 2 diabetes mellitus) (HCC)  Subjective: Denies cough or SOB.  Back pain unchanged.   Abtx:  Anti-infectives (From admission, onward)   Start     Dose/Rate Route Frequency Ordered Stop   07/14/18 1800  vancomycin (VANCOCIN) 1,250 mg in sodium chloride 0.9 % 250 mL IVPB     1,250 mg 166.7 mL/hr over 90 Minutes Intravenous Every 24 hours 07/14/18 0840     07/13/18 1800  vancomycin (VANCOCIN) 1,250 mg in sodium chloride 0.9 % 250 mL IVPB  Status:  Discontinued     1,250 mg 166.7 mL/hr over 90 Minutes Intravenous Every 48 hours 07/11/18 1813 07/14/18 0840   07/13/18 1400  fluconazole (DIFLUCAN) tablet 100 mg  Status:  Discontinued     100 mg Oral Daily 07/13/18 1318 07/13/18 1608   07/12/18 2000  fluconazole (DIFLUCAN) IVPB 200 mg  Status:  Discontinued     200 mg 100 mL/hr over 60 Minutes Intravenous Every 24 hours 07/12/18 1808 07/13/18 1318   07/11/18 1830  vancomycin (VANCOCIN) 2,000 mg in sodium chloride 0.9 % 500 mL IVPB     2,000 mg 250 mL/hr over 120 Minutes Intravenous  Once 07/11/18 1737 07/11/18 2049   07/11/18 1800  piperacillin-tazobactam (ZOSYN) IVPB 3.375 g     3.375 g 12.5 mL/hr over 240 Minutes Intravenous Every 8 hours 07/11/18 1750     07/07/18 1600  ertapenem (INVANZ) 1,000 mg in sodium chloride 0.9 % 100 mL IVPB  Status:  Discontinued     1 g 200 mL/hr over 30 Minutes Intravenous Every 24 hours 07/07/18 1431 07/11/18 1734      Medications:  Scheduled: . arformoterol  15 mcg Nebulization BID  . budesonide (PULMICORT) nebulizer solution  0.25 mg Nebulization BID  . feeding supplement  1 Container Oral TID BM  . insulin aspart  0-5 Units Subcutaneous QHS  . insulin aspart  0-9  Units Subcutaneous TID WC  . levothyroxine  125 mcg Oral Q0600  . lidocaine  1 patch Transdermal Q24H  . mouth rinse  15 mL Mouth Rinse BID  . nystatin   Topical TID  . pantoprazole  40 mg Oral BID AC  . polyvinyl alcohol  1 drop Both Eyes QHS    Objective: Vital signs in last 24 hours: Temp:  [97.7 F (36.5 C)-98.2 F (36.8 C)] 97.7 F (36.5 C) (02/23 0549) Pulse Rate:  [67-80] 79 (02/23 0549) Resp:  [16-18] 18 (02/23 0549) BP: (116-129)/(53-76) 129/76 (02/23 0549) SpO2:  [94 %-96 %] 95 % (02/23 0929) Weight:  [95.2 kg] 95.2 kg (02/23 0549)   General appearance: alert, cooperative and no distress Resp: clear to auscultation bilaterally Cardio: regular rate and rhythm GI: normal findings: bowel sounds normal and soft, non-tender  Lab Results Recent Labs    07/13/18 0234 07/13/18 1613 07/14/18 0502  WBC 16.2*  --  11.4*  HGB 10.6*  --  9.5*  HCT 35.7*  --  31.9*  NA  --  139 139  K  --  2.6* 2.5*  CL  --  102 105  CO2  --  25 24  BUN  --  17 14  CREATININE 1.17* 1.08* 0.95   Liver Panel Recent Labs  07/12/18 0414  PROT 6.0*  ALBUMIN 2.5*  AST 25  ALT 27  ALKPHOS 79  BILITOT 0.5   Sedimentation Rate Recent Labs    07/12/18 1915  ESRSEDRATE 92*   C-Reactive Protein Recent Labs    07/12/18 1915  CRP 3.4*    Microbiology: Recent Results (from the past 240 hour(s))  Culture, Urine     Status: Abnormal   Collection Time: 07/07/18  2:48 PM  Result Value Ref Range Status   Specimen Description   Final    URINE, RANDOM Performed at [facility] [address], [place], [place] [address]    Special Requests   Final    NONE Performed at [facility], [place] [address], [place], [place] [address]    Culture 30,000 COLONIES/mL YEAST (A)  Final   Report Status 07/09/2018 FINAL  Final  Culture, blood (routine x 2)     Status: None   Collection Time: 07/07/18  3:40 PM  Result Value Ref Range Status   Specimen  Description RIGHT ANTECUBITAL  Final   Special Requests   Final    BOTTLES DRAWN AEROBIC AND ANAEROBIC Blood Culture adequate volume Performed at [facility] [address], [place], [place] [address]    Culture NO GROWTH 5 DAYS  Final   Report Status 07/12/2018 FINAL  Final  Culture, blood (routine x 2)     Status: None   Collection Time: 07/07/18  3:50 PM  Result Value Ref Range Status   Specimen Description BLOOD RIGHT ARM  Final   Special Requests   Final    BOTTLES DRAWN AEROBIC ONLY Blood Culture adequate volume Performed at [facility], [place] [address], [place], [place] [address]    Culture NO GROWTH 5 DAYS  Final   Report Status 07/12/2018 FINAL  Final  MRSA PCR Screening     Status: Abnormal   Collection Time: 07/09/18 11:43 AM  Result Value Ref Range Status   MRSA by PCR POSITIVE (A) NEGATIVE Final    Comment:        The GeneXpert MRSA Assay (FDA approved for NASAL specimens only), is one component of a comprehensive MRSA colonization surveillance program. It is not intended to diagnose MRSA infection nor to guide or monitor treatment for MRSA infections. RESULT CALLED TO, READ BACK BY AND VERIFIED WITH: C.ELLIS AT 1544 ON 07/09/18 BY N.THOMPSON Performed at [facility], [place] [address] [place], [place] [address]   Culture, blood (routine x 2)     Status: None (Preliminary result)   Collection Time: 07/12/18  7:00 PM  Result Value Ref Range Status   Specimen Description BLOOD SITE NOT SPECIFIED  Final   Special Requests   Final    BOTTLES DRAWN AEROBIC AND ANAEROBIC Blood Culture adequate volume Performed at [facility] [address], [place], [place] [address]    Culture NO GROWTH 2 DAYS  Final   Report Status PENDING  Incomplete  Culture, blood (routine x 2)     Status: None (Preliminary result)   Collection Time: 07/12/18  7:16 PM  Result Value Ref Range Status   Specimen  Description BLOOD  Final   Special Requests   Final    BOTTLES DRAWN AEROBIC ONLY Blood Culture adequate volume Performed at [facility] [address], [place],  [address]    Culture NO GROWTH 2 DAYS  Final   Report Status PENDING  Incomplete  Studies/Results: Ct Head Wo Contrast  Result Date: 07/12/2018 CLINICAL DATA:  Confusion and cephalopathy. History of hypertension and diabetes. EXAM: CT HEAD WITHOUT CONTRAST TECHNIQUE: Contiguous axial images were obtained from the base of the skull through the vertex without intravenous contrast. COMPARISON:  CT HEAD November 09, 2011 FINDINGS: BRAIN: No intraparenchymal hemorrhage, mass effect nor midline shift. No parenchymal brain volume loss for age. No hydrocephalus. Patchy supratentorial white matter hypodensities within normal range for patient's age, though non-specific are most compatible with chronic small vessel ischemic disease. No acute large vascular territory infarcts. No abnormal extra-axial fluid collections. Basal cisterns are patent. VASCULAR: Moderate calcific atherosclerosis of the carotid siphons. SKULL: No skull fracture. No significant scalp soft tissue swelling. SINUSES/ORBITS: RIGHT maxillary mucosal retention cyst. Mastoid air cells are well aerated.The included ocular globes and orbital contents are non-suspicious. Status post bilateral ocular lens implants. OTHER: 2.1 cm chronic LEFT facial sebaceous cyst. IMPRESSION: Negative non-contrast CT HEAD for age. Electronically Signed   By: Elon Alas M.D.   On: 07/12/2018 16:53   Ct Angio Chest/abd/pel For Dissection W And/or W/wo  Result Date: 07/12/2018 CLINICAL DATA:  Abdominal pain. EXAM: CT ANGIOGRAPHY CHEST, ABDOMEN AND PELVIS TECHNIQUE: Multidetector CT imaging through the chest, abdomen and pelvis was performed using the standard protocol during bolus administration of intravenous contrast. Multiplanar reconstructed images and MIPs were obtained  and reviewed to evaluate the vascular anatomy. CONTRAST:  24mL ISOVUE-370 IOPAMIDOL (ISOVUE-370) INJECTION 76% COMPARISON:  Abdominal CT 07/07/2018 FINDINGS: CTA CHEST FINDINGS Cardiovascular: There is a nonocclusive right pulmonary embolus within the lobar branch of the right upper lobe, extending to the segmental branches. There is a possible smaller nonocclusive pulmonary embolus in a basilar segmental branch in the left lower lobe. There is mild right heart strain with RV/LV ratio of 1.1. Mild atherosclerotic disease of the aorta with no evidence of dissection or aneurysmal dilation. Mediastinum/Nodes: No enlarged mediastinal, hilar, or axillary lymph nodes. Thyroid gland, trachea, and esophagus demonstrate no significant findings. Small hiatal hernia. Lungs/Pleura: Bilateral lower lobe airspace consolidation versus atelectasis. Musculoskeletal: No chest wall abnormality. No acute or significant osseous findings. Review of the MIP images confirms the above findings. CTA ABDOMEN AND PELVIS FINDINGS VASCULAR Aorta: Normal caliber aorta without aneurysm, dissection, vasculitis or significant stenosis. Celiac: Patent without evidence of aneurysm, dissection, vasculitis or significant stenosis. SMA: Patent without evidence of aneurysm, dissection, vasculitis or significant stenosis. Renals: Both renal arteries are patent without evidence of aneurysm, dissection, vasculitis, fibromuscular dysplasia or significant stenosis. IMA: Patent without evidence of aneurysm, dissection, vasculitis or significant stenosis. Inflow: Patent without evidence of aneurysm, dissection, vasculitis or significant stenosis. Veins: No obvious venous abnormality within the limitations of this arterial phase study. Review of the MIP images confirms the above findings. NON-VASCULAR Hepatobiliary: Hepatic steatosis. Post cholecystectomy. Pancreas: Unremarkable. No pancreatic ductal dilatation or surrounding inflammatory changes. Spleen: Normal  in size without focal abnormality. Adrenals/Urinary Tract: Normal adrenal glands. Normal appearance of the left kidney/left ureter and urinary bladder with Foley catheter in place. There is a right hydronephrosis and hydroureter to the level of the distal third of the right ureter. No obstructive calculus or obvious ureteral mass. Stomach/Bowel: Stomach is within normal limits. No evidence of appendicitis. No evidence of bowel wall thickening, distention, or inflammatory changes. Relatively fluid contents of the colon, may be associated with a diarrheal state. Nonspecific gaseous distension of the colon. Scattered colonic diverticula. Lymphatic: Aortic atherosclerosis. No enlarged abdominal or pelvic lymph nodes. Reproductive: Status post hysterectomy. No adnexal  masses. Other: No abdominal wall hernia or abnormality. No abdominopelvic ascites. Musculoskeletal: Scoliosis of the lumbosacral spine. Multilevel osteoarthritic changes. Sclerosis and erosive endplate changes at Y3-F3 centered around the disc space. These changes are out of proportion to the degenerative disc disease seen at the remaining levels of the spine. Review of the MIP images confirms the above findings. IMPRESSION: 1. Nonocclusive right pulmonary embolus within the lobar branch of the right upper lobe, extending to the segmental branches. Possible smaller nonocclusive pulmonary embolus in a basilar segmental branch of the left lower lobe. 2. Mild right heart strain with RV/LV ratio of 1. 3. Bilateral lower lobe airspace consolidation versus atelectasis. 4. Hepatic steatosis. 5. Right hydronephrosis and hydroureter to the level of the distal third of the right ureter. No obstructive calculus or obvious ureteral mass. 6. Sclerosis and erosive endplate changes at O3-A9 centered around the disc space. These changes are out of proportion to the osteoarthritic changes at the remaining levels of the spine. Findings may represent degenerative discogenic  changes, however discitis/osteomyelitis may have a similar appearance. 7. Nonspecific gaseous distension of the colon and probable diarrheal state. These results were called by telephone at the time of interpretation on 07/12/2018 at 5:19 pm to Dr. Irene Pap , who verbally acknowledged these results. Electronically Signed   By: Fidela Salisbury M.D.   On: 07/12/2018 17:20     Assessment/Plan: L2-3 discitis, suspected GI bleed HCAP PE Funguria  Total days of antibiotics: 3 vanco/zosyn        Would continue vanco/zosyn for HCAP for 7 days then change to anbx to treat her for discitis anbx for discitis to be determined by her disc aspirate (2-24) She is currently equivocating on this procedure.  WBC better, remains afebrile.  H/H down last 24h   Bobby Rumpf MD, FACP Infectious Diseases (pager) [phone] [email or web address] 07/14/2018, 12:34 PM  LOS: 7 days

## 2018-07-14 NOTE — Progress Notes (Signed)
CRITICAL VALUE STICKER  CRITICAL VALUE: K 2.5  RECEIVER (on-site recipient of call): Ardeen Garland RN   DATE & TIME NOTIFIED: [phone], 07/14/18  MD NOTIFIED: Silas Sacramento NP   TIME OF NOTIFICATION:0615  RESPONSE: Awaiting orders

## 2018-07-14 NOTE — Progress Notes (Signed)
Pharmacy Antibiotic Note  Danielle Dennis is a 85 y.o. female admitted on 07/07/2018 as transfer from [facility] with GI Bleed.  Pharmacy has been consulted for vancomycin and zosyn dosing for PNA.  Now concern for discitis/osteo per CT. 07/14/2018:  Day# 3 Vanc + Zosyn (ID recommends 7 total days)  Afebrile  Leukocytosis improved  Scr improved to baseline  Plan:  Continue Zosyn 3.375gm IV Q8h to be infused over 4hrs  Increase Vancomycin 1250 mg IV Q24hrs. Goal AUC 400-550.  Daily SCr while on both Vanc and Zosyn  Monitor renal function and cx data   Vanc levels as needed  Height: 5\' 2"  (157.5 cm) Weight: 209 lb 14.1 oz (95.2 kg) IBW/kg (Calculated) : 50.1  Temp (24hrs), Avg:98 F (36.7 C), Min:97.7 F (36.5 C), Max:98.2 F (36.8 C)  Recent Labs  Lab 07/10/18 0420 07/11/18 0726  07/11/18 2130 07/12/18 0414 07/12/18 0628 07/12/18 1242 07/12/18 1407 07/12/18 1703 07/13/18 0234 07/13/18 1613 07/14/18 0502  WBC 14.2* 14.7*  --   --  15.1*  --   --   --   --  16.2*  --  11.4*  CREATININE 0.95 1.17*  --   --  1.21*  --   --   --   --  1.17* 1.08* 0.95  LATICACIDVEN  --   --    < > 3.9*  --  1.9 4.0* 4.4* 3.1*  --   --   --    < > = values in this interval not displayed.    Estimated Creatinine Clearance: 49.1 mL/min (by C-G formula based on SCr of 0.95 mg/dL).    Allergies  Allergen Reactions  . Codeine Other (See Comments)    Per MAR   Antimicrobials this admission:  2/16 Invanz>>2/20 2/20 vanc>> 2/20 ZEI>> 2/21 Diflucan>>2/22  Dose adjustments this admission:  2/23: Inc Vanc 1250mg  Q24h for improved renal fxn/more aggressive dosing for ?discitis  Microbiology results:  2/8 uxs: ESBL Ecoli and pan sens E faecium (from [place]?)  2/16 BCx x2: NGTD 2/16 UCx: 30K yeast FINAL 2/18 MRSA PCR: pos 2/21 BCx: NGTD  Thank you for allowing pharmacy to be a part of this patient's care.  Netta Cedars, PharmD, BCPS Pager: [phone] 07/14/2018 8:32  AM

## 2018-07-15 ENCOUNTER — Inpatient Hospital Stay (HOSPITAL_COMMUNITY): Payer: Medicare Other

## 2018-07-15 LAB — CBC
HCT: 32.9 % — ABNORMAL LOW (ref 36.0–46.0)
Hemoglobin: 9.8 g/dL — ABNORMAL LOW (ref 12.0–15.0)
MCH: 27.3 pg (ref 26.0–34.0)
MCHC: 29.8 g/dL — ABNORMAL LOW (ref 30.0–36.0)
MCV: 91.6 fL (ref 80.0–100.0)
Platelets: 423 10*3/uL — ABNORMAL HIGH (ref 150–400)
RBC: 3.59 MIL/uL — ABNORMAL LOW (ref 3.87–5.11)
RDW: 15.7 % — ABNORMAL HIGH (ref 11.5–15.5)
WBC: 10.8 10*3/uL — ABNORMAL HIGH (ref 4.0–10.5)
nRBC: 0 % (ref 0.0–0.2)

## 2018-07-15 LAB — BASIC METABOLIC PANEL
ANION GAP: 9 (ref 5–15)
BUN: 9 mg/dL (ref 8–23)
CO2: 21 mmol/L — ABNORMAL LOW (ref 22–32)
Calcium: 8.1 mg/dL — ABNORMAL LOW (ref 8.9–10.3)
Chloride: 109 mmol/L (ref 98–111)
Creatinine, Ser: 0.94 mg/dL (ref 0.44–1.00)
GFR calc Af Amer: >60 mL/min (ref >60)
GFR calc non Af Amer: 56 mL/min — ABNORMAL LOW (ref >60)
GLUCOSE: 184 mg/dL — AB (ref 70–99)
Potassium: 3.1 mmol/L — ABNORMAL LOW (ref 3.5–5.1)
Sodium: 139 mmol/L (ref 135–145)

## 2018-07-15 LAB — GLUCOSE, CAPILLARY
GLUCOSE-CAPILLARY: 151 mg/dL — AB (ref 70–99)
GLUCOSE-CAPILLARY: 122 mg/dL — AB (ref 70–99)
Glucose-Capillary: 165 mg/dL — ABNORMAL HIGH (ref 70–99)
Glucose-Capillary: 119 mg/dL — ABNORMAL HIGH (ref 70–99)

## 2018-07-15 LAB — HEPARIN LEVEL (UNFRACTIONATED): Heparin Unfractionated: 0.35 IU/mL (ref 0.30–0.70)

## 2018-07-15 MED ORDER — MIDAZOLAM HCL 2 MG/2ML IJ SOLN
INTRAMUSCULAR | Status: AC | PRN
  Administered 2018-07-15 (×3): 0.5 mg via INTRAVENOUS

## 2018-07-15 MED ORDER — LIDOCAINE HCL (PF) 1 % IJ SOLN
INTRAMUSCULAR | Status: AC | PRN
  Administered 2018-07-15: 10 mL

## 2018-07-15 MED ORDER — FENTANYL CITRATE (PF) 100 MCG/2ML IJ SOLN
INTRAMUSCULAR | Status: AC | PRN
  Administered 2018-07-15 (×2): 25 ug via INTRAVENOUS

## 2018-07-15 MED ORDER — HEPARIN (PORCINE) 25000 UT/250ML-% IV SOLN
1450.0000 [IU]/h | INTRAVENOUS | Status: AC
  Administered 2018-07-15: 1400 [IU]/h via INTRAVENOUS
  Administered 2018-07-16 – 2018-07-17 (×2): 1450 [IU]/h via INTRAVENOUS
  Filled 2018-07-15 (×4): qty 250

## 2018-07-15 MED ORDER — MIDAZOLAM HCL 2 MG/2ML IJ SOLN
INTRAMUSCULAR | Status: AC
  Filled 2018-07-15: qty 4

## 2018-07-15 MED ORDER — FLUMAZENIL 0.5 MG/5ML IV SOLN
INTRAVENOUS | Status: AC
  Filled 2018-07-15: qty 5

## 2018-07-15 MED ORDER — FENTANYL CITRATE (PF) 100 MCG/2ML IJ SOLN
INTRAMUSCULAR | Status: AC
  Filled 2018-07-15: qty 4

## 2018-07-15 MED ORDER — NALOXONE HCL 0.4 MG/ML IJ SOLN
INTRAMUSCULAR | Status: AC
  Filled 2018-07-15: qty 1

## 2018-07-15 NOTE — Progress Notes (Signed)
MEDICATION-RELATED CONSULT NOTE   IR Procedure Consult - Anticoagulant/Antiplatelet PTA/Inpatient Med List Review by Pharmacist    Procedure: CT guided L2-L3 disc aspiration for possible discitis    Completed: 07/15/2018 at 1709  Post-Procedural bleeding risk per IR MD assessment: Low  Antithrombotic medications on inpatient or PTA profile prior to procedure:     Inpatient: IV heparin infusion for PE   PTA: Aspirin 81mg  PO daily (held on admission by physician)  Recommended restart time per IR Post-Procedure Guidelines:    Unfractionated heparin (therapeutic): 4 hours after procedure  Low dose aspirin: unlikely that it was stopped   Plan:      Resume IV heparin infusion at 2115 tonight at previous rate of 1400 units/hr  Heparin level 8 hours after heparin infusion resumed  Daily CBC and heparin level  Monitor closely for s/sx of bleeding  Defer to MD on when/if appropriate to resume aspirin 81mg  PO daily given recent GI bleed    Lindell Spar, PharmD, BCPS Pager: [phone] 07/15/2018 4:43 PM

## 2018-07-15 NOTE — Progress Notes (Signed)
PT Cancellation Note  Patient Details Name: Danielle Dennis MRN: [id number] DOB: May 01, 1934   Cancelled Treatment:     Procedure..CT Guided Needle Placement.  Pt has been evaluated with rec for SNF.  Will attempt to see another day/time as schedule permits.   [name]  PTA Acute  Rehabilitation Services Pager      [phone] Office      [phone]

## 2018-07-15 NOTE — Care Management Important Message (Signed)
Important Message  Patient Details  Name: Danielle Dennis MRN: [id number] Date of Birth: Dec 23, 1933   Medicare Important Message Given:  Yes    Kerin Salen 07/15/2018, 12:18 Ricardo Message  Patient Details  Name: Danielle Dennis MRN: [id number] Date of Birth: 16-Feb-1934   Medicare Important Message Given:  Yes    Kerin Salen 07/15/2018, 12:17 PM

## 2018-07-15 NOTE — Progress Notes (Signed)
PROGRESS NOTE  Danielle Dennis TCY:[id number] DOB: 26-Apr-1934 DOA: 07/07/2018 PCP: Emmaline Kluver, MD  HPI/Recap of past 24 hours: 85 y.o.femalewith medical history significant ofHFpEF, COPD, T2DM, HTN, Hypothyroidism, ESBL e coli bacteremia and multiple other medical problems who presented to [place] hospital initially with concern for the "foley not draining right all day" on 07/03/18. Pt also noted generalized weakness and difficulty ambulating. Urine cx from 06/29/2018 showed ESBL E. Coli and pan sensitive E faecium. Pt follows with Dr. Emi Holes (urology, who had started pt on ceftin and then subsequently changed to augmentin). Initially plan was for observation and d/c to SNF on 2/13, but she had severe hypokalemia to 2.1 and acute kidney injury. On 2/14, plan for d/c to SNF, but pt had large bowel movement with blood. Pt was monitored another night and did not have any additional episdoes for blood in stool or black stool, but then started having episodes of coffee-ground low volume emesis.  GI was consulted.  No further signs of hematemesis and rectal bleeding probably from stercoral colitis.  GI signed off on 07/10/2018.  Hospital course complicated by pulmonary embolism, clinically undetermined if chronic, and suspected discitis at L2-L3.   07/14/18: Persistent hypokalemia despite repletion.  Denies chest pain, palpitations or dyspnea at rest.  Will add potassium 20 mEq to IV fluid half-normal saline at 75 cc/h, continue oral potassium replacement and repeat BMP at 1700.  07/15/18: Seen and examined at bedside.  No new complaints.  States she has not decided whether or not she would have Disc aspiration.  Assessment/Plan: Principal Problem:   GI bleed Active Problems:   UTI due to extended-spectrum beta lactamase (ESBL) producing Escherichia coli   (HFpEF) heart failure with preserved ejection fraction (HCC)   T2DM (type 2 diabetes mellitus) ([place])   Discitis of lumbar  region   Resolved upper GI bleed:  GI consulted and signed off on 07/10/2018 -PPI BID -GI consulted. No longer plans for endoscopy as pt seems to be improved from a GI standpoint -No sign of overt bleeding -No recurrence of GI bleed noted in the last 48 hours.  R Pulmonary embolism with mild right strain, clinically undetermined if chronic Positive CTA PE done on 07/12/2018 independently reviewed Continue hep drip, dosed by pharmacy Maintain O2 sat >92%  Acute on chronic hypoxic respiratory failure suspect secondary to PE vs bilateral HCAP  Reports on 2 L of oxygen as needed at baseline Now requiring 4 L to maintain O2 saturation greater than 90% Maintain O2 saturation 92% or greater Close monitoring of vital signs on telemetry Treat PE and HCAP  Suspected L2-L3 discitis/osteomyelitis ESR and CRP elevated Obtain L2-L3 aspiration by interventional radiology Procedure planned for Monday, 07/15/2018 Continue IV antibiotics empirically  On IV vancomycin and Zosyn day #5/7 ID consulted and following  Persistent hypokalemia despite repletion Potassium 3.1 from 2.5 on 07/14/2018 Continue IV potassium 20 mEq to IV fluid half-normal saline at 75 cc per hour Continue oral KCl replacement 40 mEq  Repeat BMP in the morning  Candiduria in the setting of indwelling Foley catheter, removed on 07/13/2018 Reports suprapubic pain on 07/12/18 Received 2 doses of Diflucan  DC Diflucan per ID recs on 07/13/18 Remove Foley catheter on 07/13/2018 Unclear/questionable history of urinary retention with indwelling Foley catheter Closely monitor urine output after removing Foley catheter Has been urinating without any difficulty  Bilateral HCAP Continue IV antibiotics IV vanc and Zosyn day number 5 Maintain O2 saturation greater than 92%  Lactic acidosis 2/2  R sided PE Lactic acid peaked at >4.0 Now trending down  Leukocytosis, multifactorial Reactive in the setting of PE vs HCAP Vs suspected  L2-L3 osteomyelitis/discitis Elevated lactic acid  Leukocytosis is resolving WBC 10.8k from WBC 15k from 14k Continue IV Zosyn and IV vanc empirically Repeat CBC in the morning  Urinary Tract Infection Indwelling Foley Catheter: -Of note, she recently had ESBL bacteremia in January, treated with 7 days ertapenem. -Urine cx on 2/8 and on 2/11 positive for ESBL e. Coli and enterococcus faecium -Augmentin per Dr. Emi Holes (urology) at OSH. Started on ertapenem at presentation at OSH, but transitioned back to augmentin.  Hypomagnesemia, resolved post repletion  MRSA positive Suspect from colonization Blood culture negative to date x2  Type 2 diabetes uncontrolled with hyperglycemia Hemoglobin A1c 8.1 on 07/11/2018 Continue insulin sliding scale Hold Januvia  Abdominal Discomfort suspect secondary to possible stercoral colitis: -large stool on CT abd -Required manual disimpaction. Continue cathartics as tolerated -Reports having bowel movements  AKI Baseline creatinine appears to be 0.9 Latest creatinine 1.21 Continue IV fluid hydration Avoid nephrotoxic agents/dehydration/hypotension Repeat BMP in the morning  Resolved acute diabetic encephalopathy   Generalized Weakness and Deconditioning: PT assessed and recommended SNF  Hydronephrosis: CT urogram on 06/29/2018 showed mild to moderate R hydroureteronehrosis which terminates at the junction of middle and distal thirds of the R ureter. No urinary tract calculi. The possibility of a ureteral stricture or urothelial lesion in the ureter should be considered. Urologic consultation is recommended. These findings could be better evaluated with follow up hematuria protocal CT scan if clinically appropriate.  Hypertension:holdingamlodipine, hydralazinefor now -BP stable and controlled at present  Hypothyroidism: Continue synthroid as tolerated  GERD: PPI as noted above  Chronic Diastolic UT:MLYY Bumex.   Continue gentle IV fluid hydration half-normal saline at 50 cc/h.  Hormone Replacement Therapy: currently holding estradiol. Consider discontinuing given risk with continued use at her age and new pulmonary embolism.  Physical debility/ambulatory dysfunction PT OT recommending SNF Fall precautions CSW consulted for placement  DVT prophylaxis:  Subcu Lovenox daily Code Status: DNR Family Communication:  None at bedside Disposition Plan:  SNF when hemodynamically stable and a bed is available  Consultants:   GI  Infectious disease    Antimicrobials:             Anti-infectives (From admission, onward)     Start        Dose/Rate  Route  Frequency  Ordered  Stop     07/07/18 1600    ertapenem (INVANZ) 1,000 mg in sodium chloride 0.9 % 100 mL IVPB       1 g  200 mL/hr over 30 Minutes  Intravenous  Every 24 hours  07/07/18 1431            Objective: Vitals:   07/14/18 2103 07/15/18 0611 07/15/18 0822 07/15/18 1414  BP: (!) 121/55 131/76  (!) 137/58  Pulse: 72 72 75 67  Resp: _0 Temp: 97.6 F (36.4 C) 97.9 F (36.6 C)  97.9 F (36.6 C)  TempSrc: Oral Oral  Oral  SpO2: 97% 99% 96% 99%  Weight:  95.7 kg    Height:        Intake/Output Summary (Last 24 hours) at 07/15/2018 1538 Last data filed at 07/15/2018 0857 Gross per 24 hour  Intake 2442.45 ml  Output -  Net 5035.45 ml   Filed Weights   07/12/18 0550 07/14/18 0549 07/15/18 0611  Weight: 93.4 kg 95.2 kg  95.7 kg    Exam:  . General: 85 y.o. year-old female well-developed well-nourished no acute distress.  Alert and oriented x3. . Cardiovascular: Regular rate and rhythm with no rubs or gallops.  No JVD or thyromegaly noted. Marland Kitchen Respiratory: Clear to station with no wheezes or rales.  Poor inspiratory effort. . Abdomen: Soft mildly tender diffusely with normal bowel sounds x4 quadrants. . Musculoskeletal: Trace lower extremity edema. 2/4 pulses in all 4  extremities. Marland Kitchen Psychiatry: Mood is appropriate for condition and setting   Data Reviewed: CBC: Recent Labs  Lab 07/11/18 0726 07/12/18 0414 07/13/18 0234 07/14/18 0502 07/15/18 0432  WBC 14.7* 15.1* 16.2* 11.4* 10.8*  NEUTROABS 11.6* 11.1*  --   --   --   HGB 11.1* 10.9* 10.6* 9.5* 9.8*  HCT 35.9* 35.7* 35.7* 31.9* 32.9*  MCV 90.4 90.6 93.5 91.4 91.6  PLT 372 403* 351 430* 867*   Basic Metabolic Panel: Recent Labs  Lab 07/10/18 0420 07/11/18 0726 07/12/18 0414 07/13/18 0234 07/13/18 1613 07/14/18 0502 07/14/18 1655 07/15/18 0432  NA 138 141 139  --  139 139 138 139  K 2.0* 2.3* 2.5*  --  2.6* 2.5* 2.8* 3.1*  CL 98 98 98  --  102 105 105 109  CO2 _0 --  _1 21*  GLUCOSE 208* 221* 163*  --  159* 168* 189* 184*  BUN _2 --  _3 CREATININE 0.95 1.17* 1.21* 1.17* 1.08* 0.95 0.96 0.94  CALCIUM 8.4* 8.3* 7.8*  --  7.9* 7.9* 8.2* 8.1*  MG 1.5* 1.4* 1.8  --   --  2.2  --   --   PHOS  --   --  3.3  --   --   --   --   --    GFR: Estimated Creatinine Clearance: 49.8 mL/min (by C-G formula based on SCr of 0.94 mg/dL). Liver Function Tests: Recent Labs  Lab 07/10/18 0420 07/12/18 0414  AST 21 25  ALT 23 27  ALKPHOS 75 79  BILITOT 0.5 0.5  PROT 5.9* 6.0*  ALBUMIN 2.3* 2.5*   No results for input(s): LIPASE, AMYLASE in the last 168 hours. No results for input(s): AMMONIA in the last 168 hours. Coagulation Profile: Recent Labs  Lab 07/12/18 1915  INR 1.33   Cardiac Enzymes: No results for input(s): CKTOTAL, CKMB, CKMBINDEX, TROPONINI in the last 168 hours. BNP (last 3 results) No results for input(s): PROBNP in the last 8760 hours. HbA1C: No results for input(s): HGBA1C in the last 72 hours. CBG: Recent Labs  Lab 07/14/18 1145 07/14/18 1729 07/14/18 2105 07/15/18 0731 07/15/18 1148  GLUCAP 156* 181* 150* 165* 151*   Lipid Profile: No results for input(s): CHOL, HDL, LDLCALC, TRIG, CHOLHDL, LDLDIRECT in the last 72  hours. Thyroid Function Tests: No results for input(s): TSH, T4TOTAL, FREET4, T3FREE, THYROIDAB in the last 72 hours. Anemia Panel: No results for input(s): VITAMINB12, FOLATE, FERRITIN, TIBC, IRON, RETICCTPCT in the last 72 hours. Urine analysis:    Component Value Date/Time   COLORURINE YELLOW 07/07/2018 1448   APPEARANCEUR CLOUDY (A) 07/07/2018 1448   LABSPEC 1.012 07/07/2018 1448   PHURINE 5.0 07/07/2018 1448   GLUCOSEU NEGATIVE 07/07/2018 1448   HGBUR SMALL (A) 07/07/2018 1448   BILIRUBINUR NEGATIVE 07/07/2018 1448   KETONESUR NEGATIVE 07/07/2018 1448   PROTEINUR NEGATIVE 07/07/2018 1448   NITRITE NEGATIVE 07/07/2018 1448   LEUKOCYTESUR LARGE (A) 07/07/2018 1448   Sepsis  Labs: _0 (procalcitonin:4,lacticidven:4)  ) Recent Results (from the past 240 hour(s))  Culture, Urine     Status: Abnormal   Collection Time: 07/07/18  2:48 PM  Result Value Ref Range Status   Specimen Description   Final    URINE, RANDOM Performed at [facility] [address] [place], [place] [address]    Special Requests   Final    NONE Performed at [facility], [place] [address] [place], [place] [address]    Culture 30,000 COLONIES/mL YEAST (A)  Final   Report Status 07/09/2018 FINAL  Final  Culture, blood (routine x 2)     Status: None   Collection Time: 07/07/18  3:40 PM  Result Value Ref Range Status   Specimen Description RIGHT ANTECUBITAL  Final   Special Requests   Final    BOTTLES DRAWN AEROBIC AND ANAEROBIC Blood Culture adequate volume Performed at [facility] [address], [place], [place] [address]    Culture NO GROWTH 5 DAYS  Final   Report Status 07/12/2018 FINAL  Final  Culture, blood (routine x 2)     Status: None   Collection Time: 07/07/18  3:50 PM  Result Value Ref Range Status   Specimen Description BLOOD RIGHT ARM  Final   Special Requests   Final    BOTTLES DRAWN AEROBIC ONLY Blood Culture adequate  volume Performed at [facility], [place] [address], [place], [place] [address]    Culture NO GROWTH 5 DAYS  Final   Report Status 07/12/2018 FINAL  Final  MRSA PCR Screening     Status: Abnormal   Collection Time: 07/09/18 11:43 AM  Result Value Ref Range Status   MRSA by PCR POSITIVE (A) NEGATIVE Final    Comment:        The GeneXpert MRSA Assay (FDA approved for NASAL specimens only), is one component of a comprehensive MRSA colonization surveillance program. It is not intended to diagnose MRSA infection nor to guide or monitor treatment for MRSA infections. RESULT CALLED TO, READ BACK BY AND VERIFIED WITH: C.ELLIS AT 1544 ON 07/09/18 BY N.THOMPSON Performed at [facility], [place] [address], [place], [place] [address]   Culture, blood (routine x 2)     Status: None (Preliminary result)   Collection Time: 07/12/18  7:00 PM  Result Value Ref Range Status   Specimen Description BLOOD SITE NOT SPECIFIED  Final   Special Requests   Final    BOTTLES DRAWN AEROBIC AND ANAEROBIC Blood Culture adequate volume Performed at [facility] [address], [place], [place] [address]    Culture   Final    NO GROWTH 3 DAYS Performed at [facility], [place] [address] [place], [place] [address]    Report Status PENDING  Incomplete  Culture, blood (routine x 2)     Status: None (Preliminary result)   Collection Time: 07/12/18  7:16 PM  Result Value Ref Range Status   Specimen Description   Final    BLOOD Performed at [facility] [address], [place], [place] [address]    Special Requests   Final    BOTTLES DRAWN AEROBIC ONLY Blood Culture adequate volume Performed at [facility] [address], [place], [place] [address]    Culture   Final    NO GROWTH 3 DAYS Performed at [facility], [place] [address] [place], [place] [address]  Report Status PENDING  Incomplete       Studies: No results found.  Scheduled Meds: . arformoterol  15 mcg Nebulization BID  . budesonide (PULMICORT) nebulizer solution  0.25 mg Nebulization BID  . feeding supplement  1 Container Oral TID BM  . insulin aspart  0-5 Units Subcutaneous QHS  . insulin aspart  0-9 Units Subcutaneous TID WC  . levothyroxine  125 mcg Oral Q0600  . lidocaine  1 patch Transdermal Q24H  . mouth rinse  15 mL Mouth Rinse BID  . nystatin   Topical TID  . pantoprazole  40 mg Oral BID AC  . polyvinyl alcohol  1 drop Both Eyes QHS  . potassium chloride  40 mEq Oral TID    Continuous Infusions: . sodium chloride 250 mL (07/09/18 1702)  . heparin Stopped (07/15/18 1310)  . piperacillin-tazobactam (ZOSYN)  IV 3.375 g (07/15/18 0905)  . vancomycin 166.7 mL/hr at 07/14/18 2000     LOS: 8 days     Kayleen Memos, MD [facility] Pager [phone]  If 7PM-7AM, please contact night-coverage [email or web address] Password [facility] 07/15/2018, 3:38 PM

## 2018-07-15 NOTE — Progress Notes (Signed)
Patient ID: Danielle Dennis, female   DOB: 01/24/1934, 85 y.o.   MRN: [id number]          [facility]    Date of Admission:  07/07/2018   Total days of antibiotics 12        Day 5 vancomycin and piperacillin tazobactam         Ms. Amodei has been on treatment for recurrent UTI, pneumonia and possible L2-3 discitis osteomyelitis. She is currently in radiology for lumbar aspirate.  I will follow-up tomorrow.  Michel Bickers, MD [facility] [phone] pager   [phone] cell 07/15/2018, 4:29 PM

## 2018-07-15 NOTE — Progress Notes (Signed)
ANTICOAGULATION CONSULT NOTE - Consult  Pharmacy Consult for Heparin Indication: pulmonary embolus  Allergies  Allergen Reactions  . Codeine Other (See Comments)    Per [facility]   Patient Measurements: Height: 5\' 2"  (157.5 cm) Weight: 210 lb 15.7 oz (95.7 kg) IBW/kg (Calculated) : 50.1 Heparin Dosing Weight: 72 kg  Vital Signs: Temp: 97.9 F (36.6 C) (02/24 0611) Temp Source: Oral (02/24 0611) BP: 131/76 (02/24 0611) Pulse Rate: 75 (02/24 0822)  Labs: Recent Labs    07/12/18 1915  07/13/18 0234  07/13/18 1830 07/14/18 0502 07/14/18 1655 07/15/18 0432  HGB  --    < > 10.6*  --   --  9.5*  --  9.8*  HCT  --   --  35.7*  --   --  31.9*  --  32.9*  PLT  --   --  351  --   --  430*  --  423*  APTT 105*  --   --   --   --   --   --   --   LABPROT 16.4*  --   --   --   --   --   --   --   INR 1.33  --   --   --   --   --   --   --   HEPARINUNFRC  --   --  <0.10*   < > 0.42 0.42  --  0.35  CREATININE  --   --  1.17*   < >  --  0.95 0.96 0.94   < > = values in this interval not displayed.   Estimated Creatinine Clearance: 49.8 mL/min (by C-G formula based on SCr of 0.94 mg/dL).  Medical History: Past Medical History:  Diagnosis Date  . CHF (congestive heart failure) ([place])   . COPD (chronic obstructive pulmonary disease) ([place])   . Diabetes mellitus without complication ([place])   . GERD (gastroesophageal reflux disease)   . Hypertension   . Hypothyroidism   . Presence of permanent cardiac pacemaker    Assessment: 85 y/o F admitted initially admitted with GIB found to have PE. Per notes, no further hematemtesis. GI signed off 2/19. CBC stable x 3 days.   07/15/2018:  Heparin level at goal (0.35 units/ml) on 1400 units/hr  Per RN no bleeding issues and no infusion issues  CBC: Hgb low-stable, Plt elevated 423  Plan disc aspiration today per IR  Goal of Therapy:  Heparin level 0.3-0.7 units/ml Monitor platelets by anticoagulation protocol: Yes   Plan:   Continue  heparin infusion at 1400 units/hr  Daily CBC/HL  Plan stop Heparin 3 hr prior to IR procedure   Minda Ditto PharmD Pager [phone] 07/15/2018, 11:05 AM

## 2018-07-15 NOTE — Procedures (Signed)
Interventional Radiology Procedure Note  Procedure: CT guided L2-L3 disc aspiration for possible discitis. No native fluid was aspirated.  A 1-2cc sterile saline wash was applied, with aspiration yielding only scant fluid to send for cx.  Findings: Note that on the prior CT, the patient has 12 ribs, and 6 non-ribbearing lumbar type bodies.   The site that was aspirated was the site that was designated L2-L3 on the prior CT with numbering from the bottom-up orientation, and with arrow applied.   For future reference, it is likely better to use numbering system with 6 lumbar vertebral bodies.   Complications: None  Recommendations:  - Follow up culture - Routine wound care   Signed,  Dulcy Fanny. Earleen Newport, DO

## 2018-07-16 ENCOUNTER — Inpatient Hospital Stay: Payer: Self-pay

## 2018-07-16 DIAGNOSIS — M4646 Discitis, unspecified, lumbar region: Secondary | ICD-10-CM

## 2018-07-16 DIAGNOSIS — M4626 Osteomyelitis of vertebra, lumbar region: Secondary | ICD-10-CM

## 2018-07-16 DIAGNOSIS — Z885 Allergy status to narcotic agent status: Secondary | ICD-10-CM

## 2018-07-16 LAB — GLUCOSE, CAPILLARY
Glucose-Capillary: 159 mg/dL — ABNORMAL HIGH (ref 70–99)
Glucose-Capillary: 157 mg/dL — ABNORMAL HIGH (ref 70–99)
Glucose-Capillary: 136 mg/dL — ABNORMAL HIGH (ref 70–99)
Glucose-Capillary: 153 mg/dL — ABNORMAL HIGH (ref 70–99)

## 2018-07-16 LAB — CBC
HCT: 32.2 % — ABNORMAL LOW (ref 36.0–46.0)
Hemoglobin: 9.6 g/dL — ABNORMAL LOW (ref 12.0–15.0)
MCH: 27.4 pg (ref 26.0–34.0)
MCHC: 29.8 g/dL — ABNORMAL LOW (ref 30.0–36.0)
MCV: 92 fL (ref 80.0–100.0)
Platelets: 390 10*3/uL (ref 150–400)
RBC: 3.5 MIL/uL — ABNORMAL LOW (ref 3.87–5.11)
RDW: 15.9 % — ABNORMAL HIGH (ref 11.5–15.5)
WBC: 7.2 10*3/uL (ref 4.0–10.5)
nRBC: 0 % (ref 0.0–0.2)

## 2018-07-16 LAB — BASIC METABOLIC PANEL
Anion gap: 6 (ref 5–15)
BUN: 6 mg/dL — ABNORMAL LOW (ref 8–23)
CO2: 22 mmol/L (ref 22–32)
Calcium: 8.3 mg/dL — ABNORMAL LOW (ref 8.9–10.3)
Chloride: 114 mmol/L — ABNORMAL HIGH (ref 98–111)
Creatinine, Ser: 0.85 mg/dL (ref 0.44–1.00)
GFR calc non Af Amer: >60 mL/min (ref >60)
Glucose, Bld: 141 mg/dL — ABNORMAL HIGH (ref 70–99)
Potassium: 3.2 mmol/L — ABNORMAL LOW (ref 3.5–5.1)
Sodium: 142 mmol/L (ref 135–145)

## 2018-07-16 LAB — HEPARIN LEVEL (UNFRACTIONATED)
HEPARIN UNFRACTIONATED: 0.39 [IU]/mL (ref 0.30–0.70)
Heparin Unfractionated: 0.31 IU/mL (ref 0.30–0.70)

## 2018-07-16 MED ORDER — ENSURE ENLIVE PO LIQD
237.0000 mL | Freq: Two times a day (BID) | ORAL | Status: DC
  Administered 2018-07-16 – 2018-07-19 (×6): 237 mL via ORAL

## 2018-07-16 MED ORDER — SODIUM CHLORIDE 0.9% FLUSH
10.0000 mL | INTRAVENOUS | Status: DC | PRN
  Administered 2018-07-17: 10 mL
  Filled 2018-07-16: qty 40

## 2018-07-16 MED ORDER — POTASSIUM CHLORIDE CRYS ER 20 MEQ PO TBCR
40.0000 meq | EXTENDED_RELEASE_TABLET | Freq: Two times a day (BID) | ORAL | Status: AC
  Administered 2018-07-16 (×2): 40 meq via ORAL
  Filled 2018-07-16 (×2): qty 2

## 2018-07-16 NOTE — Progress Notes (Signed)
PROGRESS NOTE  Danielle Dennis DQQ:[id number] DOB: [birth year] DOA: 07/07/2018 PCP: Emmaline Kluver, MD  HPI/Recap of past 24 hours: 85 y.o.femalewith medical history significant ofHFpEF, COPD, T2DM, HTN, Hypothyroidism, ESBL e coli bacteremia and multiple other medical problems who presented to [facility] initially with concern for the "foley not draining right all day" on 07/03/18. Pt also noted generalized weakness and difficulty ambulating. Urine cx from 06/29/2018 showed ESBL E. Coli and pan sensitive E faecium. Pt follows with Dr. Emi Holes (urology, who had started pt on ceftin and then subsequently changed to augmentin). Initially plan was for observation and d/c to SNF on 2/13, but she had severe hypokalemia to 2.1 and acute kidney injury. On 2/14, plan for d/c to SNF, but pt had large bowel movement with blood. Pt was monitored another night and did not have any additional episdoes for blood in stool or black stool, but then started having episodes of coffee-ground low volume emesis.  GI was consulted.  No further signs of hematemesis and rectal bleeding probably from stercoral colitis.  GI signed off on 07/10/2018.  Hospital course complicated by pulmonary embolism, clinically undetermined if chronic, and suspected discitis at L2-L3.  Disc aspirated by IR on 07/15/18. Not enough sample removed. ID following and rec c/w IV vanc and IV meropenem. IV team consulted for PICC line plaement.  07/16/2018: Patient seen and examined at bedside.  No acute events overnight.  No new complaints.   Assessment/Plan: Principal Problem:   GI bleed Active Problems:   UTI due to extended-spectrum beta lactamase (ESBL) producing Escherichia coli   (HFpEF) heart failure with preserved ejection fraction (HCC)   T2DM (type 2 diabetes mellitus) ([place])   Discitis of lumbar region   Resolved upper GI bleed:  GI consulted and signed off on 07/10/2018 -Continue PPI BID -No longer plans for  endoscopy as pt seems to be improved from a GI standpoint -No sign of overt bleeding -No recurrence of GI bleed noted in the last 48 hours.  R Pulmonary embolism with mild right strain, clinically undetermined if chronic Positive CTA PE done on 07/12/2018 independently reviewed Continue hep drip, dosed by pharmacy Maintain O2 sat >92%  Acute on chronic hypoxic respiratory failure suspect secondary to PE vs bilateral HCAP  Reports on 2 L of oxygen as needed at baseline Now requiring 4 L to maintain O2 saturation greater than 90% Maintain O2 saturation 92% or greater Close monitoring of vital signs on telemetry Treat PE and HCAP  L2-L3 discitis/osteomyelitis ESR and CRP elevated Obtain L2-L3 aspiration by interventional radiology 07/15/2018 Continue IV antibiotics empirically IV vancomycin and IV meropenem Completed 5 days of IV Zosyn ID consulted and following.  Highly appreciated. Obtain PICC line placement by IV team  Persistent hypokalemia despite repletion Slowly improving potassium 3.2 from 2.5 on 07/14/2018 Continue IV potassium 20 mEq to IV fluid half-normal saline at 75 cc per hour Continue oral KCl replacement 40 mEq  Repeat BMP in the morning  Candiduria in the setting of indwelling Foley catheter, removed on 07/13/2018 Reports suprapubic pain on 07/12/18 Received 2 doses of Diflucan  DC Diflucan per ID recs on 07/13/18 Remove Foley catheter on 07/13/2018 Unclear/questionable history of urinary retention with indwelling Foley catheter Closely monitor urine output after removing Foley catheter Has been urinating without any difficulty  Bilateral HCAP Completed 5 days of IV antibiotics IV vancomycin and IV Zosyn  Maintain O2 saturation greater than 92%  Lactic acidosis 2/2 R sided PE Lactic acid peaked at >  4.0 Trended down  Resolved leukocytosis, multifactorial Reactive in the setting of PE vs HCAP Vs suspected L2-L3 osteomyelitis/discitis WBC normal 7.2 Continue IV  antibiotics empirically to treat discitis/osteomyelitis  Urinary Tract Infection Indwelling Foley Catheter: -Of note, she recently had ESBL bacteremia in January, treated with 7 days ertapenem. -Urine cx on 2/8 and on 2/11 positive for ESBL e. Coli and enterococcus faecium -Augmentin per Dr. Emi Holes (urology) at OSH. Started on ertapenem at presentation at OSH, but transitioned back to augmentin.  Hypomagnesemia, resolved post repletion  MRSA positive Suspect from colonization Blood culture negative to date x2  Type 2 diabetes uncontrolled with hyperglycemia Hemoglobin A1c 8.1 on 07/11/2018 Continue insulin sliding scale Hold Januvia  Abdominal Discomfort suspect secondary to possible stercoral colitis: -large stool on CT abd -Required manual disimpaction. Continue cathartics as tolerated -Reports having loose bowel movements Hold off laxatives  Resolved AKI Baseline creatinine appears to be 0.9 Creatinine on 07/16/2018 was back to baseline at 0.85 with GFR greater than 60 Continue to avoid nephrotoxic agents/dehydration/hypotension Monitor urine output  Resolved acute diabetic encephalopathy   Generalized Weakness and Deconditioning: PT assessed and recommended SNF  Hydronephrosis: CT urogram on 06/29/2018 showed mild to moderate R hydroureteronehrosis which terminates at the junction of middle and distal thirds of the R ureter. No urinary tract calculi. The possibility of a ureteral stricture or urothelial lesion in the ureter should be considered.  Follow up with urology outpatient  Hypertension:holdingamlodipine, hydralazinefor now -BP stable and controlled at present  Hypothyroidism: Continue synthroid as tolerated  GERD: PPI as noted above  Chronic Diastolic GH:WEXH Bumex.  Continue gentle IV fluid hydration half-normal saline at 50 cc/h.  Hormone Replacement Therapy:  Previously on estradiol. Discontinue due to recent pulmonary embolism    Physical debility/ambulatory dysfunction PT OT recommending SNF Fall precautions CSW consulted for placement  DVT prophylaxis:  Subcu Lovenox daily Code Status: DNR Family Communication:  None at bedside Disposition Plan:  SNF when hemodynamically stable and a bed is available  Consultants:   GI  Infectious disease    Antimicrobials:             Anti-infectives (From admission, onward)     Start        Dose/Rate  Route  Frequency  Ordered  Stop     07/07/18 1600    ertapenem (INVANZ) 1,000 mg in sodium chloride 0.9 % 100 mL IVPB       1 g  200 mL/hr over 30 Minutes  Intravenous  Every 24 hours  07/07/18 1431            Objective: Vitals:   07/15/18 2125 07/16/18 0517 07/16/18 0754 07/16/18 1158  BP: 134/79 (!) 122/59  140/69  Pulse: 78 74  75  Resp: 19 20  (!) 22  Temp: 98.2 F (36.8 C) 97.9 F (36.6 C)  97.8 F (36.6 C)  TempSrc: Axillary Oral  Oral  SpO2: 99% (!) 89% 91% 93%  Weight:  95.9 kg    Height:        Intake/Output Summary (Last 24 hours) at 07/16/2018 1527 Last data filed at 07/16/2018 1444 Gross per 24 hour  Intake 1101.68 ml  Output 500 ml  Net 601.68 ml   Filed Weights   07/14/18 0549 07/15/18 0611 07/16/18 0517  Weight: 95.2 kg 95.7 kg 95.9 kg    Exam:  . General: 85 y.o. year-old female well-developed well-nourished in no acute distress.  Alert and oriented x3. . Cardiovascular: Regular rate and  rhythm with no rubs or gallops.  No JVD or thyromegaly. Marland Kitchen Respiratory: Clear to auscultation with no wheezes or rales.  Poor inspiratory effort.  . Abdomen: Soft mildly tender diffusely with normal bowel sounds x4 quadrants. . Musculoskeletal: Trace lower extremity edema. 2/4 pulses in all 4 extremities. Marland Kitchen Psychiatry: Mood is appropriate for condition and setting   Data Reviewed: CBC: Recent Labs  Lab 07/11/18 0726 07/12/18 0414 07/13/18 0234 07/14/18 0502 07/15/18 0432 07/16/18 0429  WBC 14.7*  15.1* 16.2* 11.4* 10.8* 7.2  NEUTROABS 11.6* 11.1*  --   --   --   --   HGB 11.1* 10.9* 10.6* 9.5* 9.8* 9.6*  HCT 35.9* 35.7* 35.7* 31.9* 32.9* 32.2*  MCV 90.4 90.6 93.5 91.4 91.6 92.0  PLT 372 403* 351 430* 423* 735   Basic Metabolic Panel: Recent Labs  Lab 07/10/18 0420 07/11/18 0726 07/12/18 0414  07/13/18 1613 07/14/18 0502 07/14/18 1655 07/15/18 0432 07/16/18 0429  NA 138 141 139  --  139 139 138 139 142  K 2.0* 2.3* 2.5*  --  2.6* 2.5* 2.8* 3.1* 3.2*  CL 98 98 98  --  102 105 105 109 114*  CO2 '28 29 27  '$ --  '25 24 25 '$ 21* 22  GLUCOSE 208* 221* 163*  --  159* 168* 189* 184* 141*  BUN '12 15 20  '$ --  '17 14 11 9 '$ 6*  CREATININE 0.95 1.17* 1.21*   < > 1.08* 0.95 0.96 0.94 0.85  CALCIUM 8.4* 8.3* 7.8*  --  7.9* 7.9* 8.2* 8.1* 8.3*  MG 1.5* 1.4* 1.8  --   --  2.2  --   --   --   PHOS  --   --  3.3  --   --   --   --   --   --    < > = values in this interval not displayed.   GFR: Estimated Creatinine Clearance: 55.1 mL/min (by C-G formula based on SCr of 0.85 mg/dL). Liver Function Tests: Recent Labs  Lab 07/10/18 0420 07/12/18 0414  AST 21 25  ALT 23 27  ALKPHOS 75 79  BILITOT 0.5 0.5  PROT 5.9* 6.0*  ALBUMIN 2.3* 2.5*   No results for input(s): LIPASE, AMYLASE in the last 168 hours. No results for input(s): AMMONIA in the last 168 hours. Coagulation Profile: Recent Labs  Lab 07/12/18 1915  INR 1.33   Cardiac Enzymes: No results for input(s): CKTOTAL, CKMB, CKMBINDEX, TROPONINI in the last 168 hours. BNP (last 3 results) No results for input(s): PROBNP in the last 8760 hours. HbA1C: No results for input(s): HGBA1C in the last 72 hours. CBG: Recent Labs  Lab 07/15/18 1148 07/15/18 1705 07/15/18 2121 07/16/18 0746 07/16/18 1140  GLUCAP 151* 122* 119* 159* 157*   Lipid Profile: No results for input(s): CHOL, HDL, LDLCALC, TRIG, CHOLHDL, LDLDIRECT in the last 72 hours. Thyroid Function Tests: No results for input(s): TSH, T4TOTAL, FREET4, T3FREE,  THYROIDAB in the last 72 hours. Anemia Panel: No results for input(s): VITAMINB12, FOLATE, FERRITIN, TIBC, IRON, RETICCTPCT in the last 72 hours. Urine analysis:    Component Value Date/Time   COLORURINE YELLOW 07/07/2018 1448   APPEARANCEUR CLOUDY (A) 07/07/2018 1448   LABSPEC 1.012 07/07/2018 1448   PHURINE 5.0 07/07/2018 1448   GLUCOSEU NEGATIVE 07/07/2018 1448   HGBUR SMALL (A) 07/07/2018 1448   BILIRUBINUR NEGATIVE 07/07/2018 1448   KETONESUR NEGATIVE 07/07/2018 1448   PROTEINUR NEGATIVE 07/07/2018 1448   NITRITE NEGATIVE 07/07/2018  [place] (A) 07/07/2018 1448   Sepsis Labs: '@LABRCNTIP'$ (procalcitonin:4,lacticidven:4)  ) Recent Results (from the past 240 hour(s))  Culture, Urine     Status: Abnormal   Collection Time: 07/07/18  2:48 PM  Result Value Ref Range Status   Specimen Description   Final    URINE, RANDOM Performed at [facility] [address], [place], [place] [address]    Special Requests   Final    NONE Performed at [facility], [place] [address] [place], [place] [address]    Culture 30,000 COLONIES/mL YEAST (A)  Final   Report Status 07/09/2018 FINAL  Final  Culture, blood (routine x 2)     Status: None   Collection Time: 07/07/18  3:40 PM  Result Value Ref Range Status   Specimen Description RIGHT ANTECUBITAL  Final   Special Requests   Final    BOTTLES DRAWN AEROBIC AND ANAEROBIC Blood Culture adequate volume Performed at [facility] [address], [place], [place] [address]    Culture NO GROWTH 5 DAYS  Final   Report Status 07/12/2018 FINAL  Final  Culture, blood (routine x 2)     Status: None   Collection Time: 07/07/18  3:50 PM  Result Value Ref Range Status   Specimen Description BLOOD RIGHT ARM  Final   Special Requests   Final    BOTTLES DRAWN AEROBIC ONLY Blood Culture adequate volume Performed at [facility], [place] [address] [place],  [place] [address]    Culture NO GROWTH 5 DAYS  Final   Report Status 07/12/2018 FINAL  Final  MRSA PCR Screening     Status: Abnormal   Collection Time: 07/09/18 11:43 AM  Result Value Ref Range Status   MRSA by PCR POSITIVE (A) NEGATIVE Final    Comment:        The GeneXpert MRSA Assay (FDA approved for NASAL specimens only), is one component of a comprehensive MRSA colonization surveillance program. It is not intended to diagnose MRSA infection nor to guide or monitor treatment for MRSA infections. RESULT CALLED TO, READ BACK BY AND VERIFIED WITH: C.ELLIS AT 1544 ON 07/09/18 BY N.THOMPSON Performed at [facility], [place] [address], [place], [place] [address]   Culture, blood (routine x 2)     Status: None (Preliminary result)   Collection Time: 07/12/18  7:00 PM  Result Value Ref Range Status   Specimen Description BLOOD SITE NOT SPECIFIED  Final   Special Requests   Final    BOTTLES DRAWN AEROBIC AND ANAEROBIC Blood Culture adequate volume Performed at [facility] [address] [place], [place] [address]    Culture   Final    NO GROWTH 4 DAYS Performed at [facility], [place] [address] [place], [place] [address]    Report Status PENDING  Incomplete  Culture, blood (routine x 2)     Status: None (Preliminary result)   Collection Time: 07/12/18  7:16 PM  Result Value Ref Range Status   Specimen Description   Final    BLOOD Performed at [facility] [address] [place], [place] [address]    Special Requests   Final    BOTTLES DRAWN AEROBIC ONLY Blood Culture adequate volume Performed at [facility] [address], [place], [place] [address]    Culture   Final    NO GROWTH 4 DAYS Performed at [facility], [address], [place], [place] [address]    Report Status PENDING  Incomplete  Aerobic/Anaerobic Culture (surgical/deep wound)     Status: None (Preliminary result)    Collection Time: 07/15/18  4:32 PM  Result Value Ref Range Status   Specimen Description   Final    WOUND DEEP WOUND 12 TO 13 DISC ASPIRATION Performed at [facility] [address] [place], [place] [address]    Special Requests   Final    NONE Performed at [facility], [place] [address], [place], [place] [address]    Gram Stain   Final    RARE WBC PRESENT, PREDOMINANTLY PMN NO ORGANISMS SEEN    Culture   Final    NO GROWTH < 24 HOURS Performed at [facility] [address], [place], [place] [address]    Report Status PENDING  Incomplete      Studies: Ct Guided Needle Placement  Result Date: 07/15/2018 INDICATION: 85 year old female with a history of possible discitis/osteomyelitis EXAM: CT-GUIDED NEEDLE PLACEMENT FOR ASPIRATION OF DISCITIS/OSTEOMYELITIS MEDICATIONS: The patient is currently admitted to the hospital and receiving intravenous antibiotics. The antibiotics were administered within an appropriate time frame prior to the initiation of the procedure. COMPARISON:  CT 07/12/2018 ANESTHESIA/SEDATION: Fentanyl 50 mcg IV; Versed 1.5 mg IV Moderate Sedation Time:  10 minutes The patient was continuously monitored during the procedure by the interventional radiology nurse under my direct supervision. COMPLICATIONS: None PROCEDURE: Informed written consent was obtained from the patient after a thorough discussion of the procedural risks, benefits and alternatives. All questions were addressed. Maximal Sterile Barrier Technique was utilized including caps, mask, sterile gowns, sterile gloves, sterile drape, hand hygiene and skin antiseptic. A timeout was performed prior to the initiation of the procedure. Patient position prone on the CT gantry table. Scout CT of the lumbar region was performed for planning purposes. Using the prior imaging, the scout image, and the toe program, the L2-L3 level was identified. Note that the L2-L3 level,  actually corresponds to an L3-L4 level as the patient has 6 lumbar type non rib-bearing vertebral bodies. The prior CT designated the level and L2-L3 with an aero, and this was the level that was targeted. The patient is prepped and draped in the usual sterile fashion. 1% lidocaine was used for local anesthesia. Using CT guidance, 18 gauge trocar needle was advanced to the disc space. Attempt at aspiration was performed with no native fluid withdrawn. 1 cc-2 cc of sterile saline were then infused through the needle, with subsequent aspirate of small volume of fluid. This small volume was sent for culture. Sterile bandage was placed. Patient tolerated the procedure well and remained hemodynamically stable throughout. No complications were encountered and no significant blood loss. IMPRESSION: Status post CT-guided disc aspiration of L2-L3 (using the numbering system on the most recent comparison CT). Small volume of sterile wash was sent for culture, as no native fluid could be aspirated. Signed, Dulcy Fanny. Dellia Nims, RPVI [facility] Electronically Signed   By: Corrie Mckusick D.O.   On: 07/15/2018 17:28    Scheduled Meds: . arformoterol  15 mcg Nebulization BID  . budesonide (PULMICORT) nebulizer solution  0.25 mg Nebulization BID  . feeding supplement (ENSURE ENLIVE)  237 mL Oral BID BM  . insulin aspart  0-5 Units Subcutaneous QHS  . insulin aspart  0-9 Units Subcutaneous TID WC  . levothyroxine  125 mcg Oral Q0600  .  lidocaine  1 patch Transdermal Q24H  . mouth rinse  15 mL Mouth Rinse BID  . nystatin   Topical TID  . pantoprazole  40 mg Oral BID AC  . polyvinyl alcohol  1 drop Both Eyes QHS  . potassium chloride  40 mEq Oral BID    Continuous Infusions: . sodium chloride 250 mL (07/09/18 1702)  . heparin 1,450 Units/hr (07/16/18 0524)  . piperacillin-tazobactam (ZOSYN)  IV 3.375 g (07/16/18 1031)  . vancomycin 1,250 mg (07/15/18 1737)      LOS: 9 days     Kayleen Memos, MD [facility] Pager [phone]  If 7PM-7AM, please contact night-coverage [email or web address] Password [facility] 07/16/2018, 3:27 PM

## 2018-07-16 NOTE — Progress Notes (Signed)
ANTICOAGULATION CONSULT NOTE - Follow Up Consult  Pharmacy Consult for Heparin Indication: pulmonary embolus  Allergies  Allergen Reactions  . Codeine Other (See Comments)    Per [facility]    Patient Measurements: Height: 5\' 2"  (157.5 cm) Weight: 210 lb 15.7 oz (95.7 kg) IBW/kg (Calculated) : 50.1 Heparin Dosing Weight:   Vital Signs: Temp: 98.2 F (36.8 C) (02/24 2125) Temp Source: Axillary (02/24 2125) BP: 134/79 (02/24 2125) Pulse Rate: 78 (02/24 2125)  Labs: Recent Labs    07/14/18 0502 07/14/18 1655 07/15/18 0432 07/16/18 0429  HGB 9.5*  --  9.8* 9.6*  HCT 31.9*  --  32.9* 32.2*  PLT 430*  --  423* 390  HEPARINUNFRC 0.42  --  0.35 0.31  CREATININE 0.95 0.96 0.94 0.85    Estimated Creatinine Clearance: 55 mL/min (by C-G formula based on SCr of 0.85 mg/dL).   Medications:  Infusions:  . sodium chloride 250 mL (07/09/18 1702)  . heparin 1,400 Units/hr (07/15/18 2108)  . piperacillin-tazobactam (ZOSYN)  IV 3.375 g (07/16/18 0258)  . vancomycin 1,250 mg (07/15/18 1737)    Assessment: Patient with heparin level at goal but trending downward from prior to procedure to most recent level.  No heparin issues per RN.  Goal of Therapy:  Heparin level 0.3-0.7 units/ml Monitor platelets by anticoagulation protocol: Yes   Plan:  Increase heparin to 1450 units/hr Recheck heparin level at 1400  [address], Ziyad Dyar [place] 07/16/2018,5:17 AM

## 2018-07-16 NOTE — Progress Notes (Signed)
Patient ID: Danielle Dennis, female   DOB: Oct 20, 1933, 85 y.o.   MRN: [id number]         [facility]  Date of Admission:  07/07/2018   Total days of antibiotics 13        Day 6 of vancomycin        Day 6 piperacillin tazobactam         ASSESSMENT: I spoke to Danielle Dennis daughter by phone this morning.  She tells me that her mother never recovered from her E. coli urinary tract infection and bacteremia when she was hospitalized at [facility] on 06/07/2018.  She has developed acute on chronic back pain that is quite severe.  CT shows evidence of L2-3 discitis/osteomyelitis and pneumonia.  She underwent aspirate of L2-3 yesterday.  No fluid was found but sterile saline was injected and aspirated.  No organisms were seen on stain.  Cultures may very well remain negative because of recent antibiotic therapy.  Her E. coli was an ESBL producing strain sensitive to carbapenem antibiotics. I will change piperacillin tazobactam back to meropenem and continue vancomycin for her discitis.  PLAN: 1. Continue vancomycin 2. Change piperacillin tazobactam to meropenem  Diagnosis: Lumbar discitis  Culture Result: Pending  Allergies  Allergen Reactions  . Codeine Other (See Comments)    Per [facility]    OPAT Orders Discharge antibiotics: Per pharmacy protocol vancomycin and meropenem Aim for Vancomycin trough 15-20 (unless otherwise indicated) Duration: 6 weeks End Date: 08/20/2018  [facility] Care Per Protocol:  Labs weekly while on IV antibiotics: _x_ CBC with differential _x_ BMP __ CMP _x_ CRP _x_ ESR _x_ Vancomycin trough __ CK  _x_ Please pull PIC at completion of IV antibiotics __ Please leave PIC in place until doctor has seen patient or been notified  Fax weekly labs to [phone]  Clinic Follow Up Appt: 08/22/2018  Principal Problem:   GI bleed Active Problems:   UTI due to extended-spectrum beta lactamase (ESBL) producing Escherichia coli   (HFpEF)  heart failure with preserved ejection fraction (HCC)   T2DM (type 2 diabetes mellitus) ([place])   Discitis of lumbar region   Scheduled Meds: . arformoterol  15 mcg Nebulization BID  . budesonide (PULMICORT) nebulizer solution  0.25 mg Nebulization BID  . feeding supplement  1 Container Oral TID BM  . insulin aspart  0-5 Units Subcutaneous QHS  . insulin aspart  0-9 Units Subcutaneous TID WC  . levothyroxine  125 mcg Oral Q0600  . lidocaine  1 patch Transdermal Q24H  . mouth rinse  15 mL Mouth Rinse BID  . nystatin   Topical TID  . pantoprazole  40 mg Oral BID AC  . polyvinyl alcohol  1 drop Both Eyes QHS  . potassium chloride  40 mEq Oral BID   Continuous Infusions: . sodium chloride 250 mL (07/09/18 1702)  . heparin 1,450 Units/hr (07/16/18 0524)  . piperacillin-tazobactam (ZOSYN)  IV 3.375 g (07/16/18 1031)  . vancomycin 1,250 mg (07/15/18 1737)   PRN Meds:.sodium chloride, acetaminophen **OR** acetaminophen, albuterol, docusate sodium, ondansetron **OR** ondansetron (ZOFRAN) IV, polyethylene glycol  Review of Systems: Review of Systems  Unable to perform ROS: Mental acuity    Allergies  Allergen Reactions  . Codeine Other (See Comments)    Per MAR    OBJECTIVE: Vitals:   07/15/18 2119 07/15/18 2125 07/16/18 0517 07/16/18 0754  BP:  134/79 (!) 122/59   Pulse:  78 74   Resp:  19 20  Temp:  98.2 F (36.8 C) 97.9 F (36.6 C)   TempSrc:  Axillary Oral   SpO2: 98% 99% (!) 89% 91%  Weight:   95.9 kg   Height:       Body mass index is 38.67 kg/m.  Physical Exam Constitutional:      Comments: She is very lethargic.  She does not respond consistently to questions.  Cardiovascular:     Rate and Rhythm: Normal rate and regular rhythm.     Heart sounds: No murmur.  Pulmonary:     Effort: Pulmonary effort is normal.     Breath sounds: Normal breath sounds.     Lab Results Lab Results  Component Value Date   WBC 7.2 07/16/2018   HGB 9.6 (L) 07/16/2018   HCT  32.2 (L) 07/16/2018   MCV 92.0 07/16/2018   PLT 390 07/16/2018    Lab Results  Component Value Date   CREATININE 0.85 07/16/2018   BUN 6 (L) 07/16/2018   NA 142 07/16/2018   K 3.2 (L) 07/16/2018   CL 114 (H) 07/16/2018   CO2 22 07/16/2018    Lab Results  Component Value Date   ALT 27 07/12/2018   AST 25 07/12/2018   ALKPHOS 79 07/12/2018   BILITOT 0.5 07/12/2018     Microbiology: Recent Results (from the past 240 hour(s))  Culture, Urine     Status: Abnormal   Collection Time: 07/07/18  2:48 PM  Result Value Ref Range Status   Specimen Description   Final    URINE, RANDOM Performed at [facility], [place] [address] [place], [place] [address]    Special Requests   Final    NONE Performed at [facility], [place] [address] [place], [place] [address]    Culture 30,000 COLONIES/mL YEAST (A)  Final   Report Status 07/09/2018 FINAL  Final  Culture, blood (routine x 2)     Status: None   Collection Time: 07/07/18  3:40 PM  Result Value Ref Range Status   Specimen Description RIGHT ANTECUBITAL  Final   Special Requests   Final    BOTTLES DRAWN AEROBIC AND ANAEROBIC Blood Culture adequate volume Performed at [place] [address], [place], [place] [address]    Culture NO GROWTH 5 DAYS  Final   Report Status 07/12/2018 FINAL  Final  Culture, blood (routine x 2)     Status: None   Collection Time: 07/07/18  3:50 PM  Result Value Ref Range Status   Specimen Description BLOOD RIGHT ARM  Final   Special Requests   Final    BOTTLES DRAWN AEROBIC ONLY Blood Culture adequate volume Performed at [facility], [place] [address], [place],  [address]    Culture NO GROWTH 5 DAYS  Final   Report Status 07/12/2018 FINAL  Final  MRSA PCR Screening     Status: Abnormal   Collection Time: 07/09/18 11:43 AM  Result Value Ref Range Status   MRSA by PCR POSITIVE (A) NEGATIVE Final    Comment:        The  GeneXpert MRSA Assay (FDA approved for NASAL specimens only), is one component of a comprehensive MRSA colonization surveillance program. It is not intended to diagnose MRSA infection nor to guide or monitor treatment for MRSA infections. RESULT CALLED TO, READ BACK BY AND VERIFIED WITH: C.ELLIS AT 1544 ON 07/09/18 BY N.THOMPSON Performed at [facility], [place] [address] [place], [place]  [address]   Culture, blood (routine x 2)     Status: None (Preliminary result)   Collection Time: 07/12/18  7:00 PM  Result Value Ref Range Status   Specimen Description BLOOD SITE NOT SPECIFIED  Final   Special Requests   Final    BOTTLES DRAWN AEROBIC AND ANAEROBIC Blood Culture adequate volume Performed at [place] [address] [place], [place] [address]    Culture   Final    NO GROWTH 3 DAYS Performed at [facility], [place] [address], [place], [place] [address]    Report Status PENDING  Incomplete  Culture, blood (routine x 2)     Status: None (Preliminary result)   Collection Time: 07/12/18  7:16 PM  Result Value Ref Range Status   Specimen Description   Final    BLOOD Performed at [place] [address] [place], [place] [address]    Special Requests   Final    BOTTLES DRAWN AEROBIC ONLY Blood Culture adequate volume Performed at [place] [address] [place], [place] [address]    Culture   Final    NO GROWTH 3 DAYS Performed at [facility], [place] [address], [place], [place] [address]    Report Status PENDING  Incomplete  Aerobic/Anaerobic Culture (surgical/deep wound)     Status: None (Preliminary result)   Collection Time: 07/15/18  4:32 PM  Result Value Ref Range Status   Specimen Description   Final    WOUND DEEP WOUND 12 TO 13 DISC ASPIRATION Performed at [place] [address], [place], [place] [address]    Special Requests   Final     NONE Performed at [facility], [place] [address], [place], [place] [address]    Gram Stain   Final    RARE WBC PRESENT, PREDOMINANTLY PMN NO ORGANISMS SEEN    Culture   Final    NO GROWTH < 24 HOURS Performed at [place] [address] [place], [place] [address]    Report Status PENDING  Incomplete    Michel Bickers, MD [facility] [phone] pager   [phone] cell 07/16/2018, 11:19 AM

## 2018-07-16 NOTE — Progress Notes (Signed)
Nutrition Follow-up  DOCUMENTATION CODES:   Obesity unspecified  INTERVENTION:   D/C Boost Breeze   -Add Ensure Enlive po BID, each supplement provides 350 kcal and 20 grams of protein  NUTRITION DIAGNOSIS:   Inadequate oral intake related to lethargy/confusion as evidenced by meal completion < 25%.  Resolved   GOAL:   Patient will meet greater than or equal to 90% of their needs  Progressing  MONITOR:   PO intake, Supplement acceptance, Weight trends, Labs, Diet advancement, I & O's  REASON FOR ASSESSMENT:   Consult Assessment of nutrition requirement/status  ASSESSMENT:   Patient with PMH significant for CHF, COPD, DM, HTN, Hypothyroidism, and ESBL e coli bacteremia. Presents this admission with GI bleed and UTI.    2/24- L2-L3 disc aspiration for possible discitis  Pt reports having a great appetite. Meal completions charted as 25-50% for pt's last eight meals. She consumed 50% of her omelet and rice crispies this am without complication. She is drinking Boost Breeze 2-3 times daily. RD to change to Ensure as intake seems to be inadequate.   Weight noted to remain stable around 211 lb. Will continue to monitor trends.   Medications reviewed and include: SS novolog Labs reviewed: K 3.2 (L) CBG 119-165  Diet Order:   Diet Order            Diet regular Room service appropriate? Yes; Fluid consistency: Thin  Diet effective now              EDUCATION NEEDS:   Not appropriate for education at this time  Skin:  Skin Assessment: Skin Integrity Issues: Skin Integrity Issues:: Other (Comment) Other: MASD- butt, groin, breast  Last BM:  2/24  Height:   Ht Readings from Last 1 Encounters:  07/12/18 5\' 2"  (1.575 m)    Weight:   Wt Readings from Last 1 Encounters:  07/16/18 95.9 kg    Ideal Body Weight:  50 kg  BMI:  Body mass index is 38.67 kg/m.  Estimated Nutritional Needs:   Kcal:  1500-1700 kcal  Protein:  75-90 grams  Fluid:  >/= 1.5  L/day   Mariana Single RD, LDN Clinical Nutrition Pager # - [phone]

## 2018-07-16 NOTE — Progress Notes (Signed)
ANTICOAGULATION CONSULT NOTE - Consult  Pharmacy Consult for Heparin Indication: pulmonary embolus  Allergies  Allergen Reactions  . Codeine Other (See Comments)    Per [facility]   Patient Measurements: Height: 5\' 2"  (157.5 cm) Weight: 211 lb 6.7 oz (95.9 kg) IBW/kg (Calculated) : 50.1 Heparin Dosing Weight: 72 kg  Vital Signs: Temp: 97.8 F (36.6 C) (02/25 1158) Temp Source: Oral (02/25 1158) BP: 140/69 (02/25 1158) Pulse Rate: 75 (02/25 1158)  Labs: Recent Labs    07/14/18 0502 07/14/18 1655 07/15/18 0432 07/16/18 0429 07/16/18 1342  HGB 9.5*  --  9.8* 9.6*  --   HCT 31.9*  --  32.9* 32.2*  --   PLT 430*  --  423* 390  --   HEPARINUNFRC 0.42  --  0.35 0.31 0.39  CREATININE 0.95 0.96 0.94 0.85  --    Estimated Creatinine Clearance: 55.1 mL/min (by C-G formula based on SCr of 0.85 mg/dL).  Medical History: Past Medical History:  Diagnosis Date  . CHF (congestive heart failure) ([place])   . COPD (chronic obstructive pulmonary disease) ([place])   . Diabetes mellitus without complication ([place])   . GERD (gastroesophageal reflux disease)   . Hypertension   . Hypothyroidism   . Presence of permanent cardiac pacemaker    Assessment: 85 y/o F admitted initially admitted with GIB found to have PE. Per notes, no further hematemtesis. GI signed off 2/19. CBC stable x 3 days.   07/16/2018:  1342 Heparin level = 0.39 units/ml on Heparin 1450 units/hr  CBC: Hgb low-stable, Plt 390  2/24 Disc aspiration completed per IR - Heparin resumed 2100  Goal of Therapy:  Heparin level 0.3-0.7 units/ml Monitor platelets by anticoagulation protocol: Yes   Plan:   Continue heparin infusion at 1450 units/hr  Daily CBC/HL  Anticipate change to oral DOAC  Minda Ditto PharmD Pager [phone] 07/16/2018, 2:48 PM

## 2018-07-16 NOTE — Progress Notes (Signed)
Peripherally Inserted Central Catheter/Midline Placement  The IV Nurse has discussed with the patient and/or persons authorized to consent for the patient, the purpose of this procedure and the potential benefits and risks involved with this procedure.  The benefits include less needle sticks, lab draws from the catheter, and the patient may be discharged home with the catheter. Risks include, but not limited to, infection, bleeding, blood clot (thrombus formation), and puncture of an artery; nerve damage and irregular heartbeat and possibility to perform a PICC exchange if needed/ordered by physician.  Alternatives to this procedure were also discussed.  Bard Power PICC patient education guide, fact sheet on infection prevention and patient information card has been provided to patient /or left at bedside.    PICC/Midline Placement Documentation  PICC Single Lumen 15/94/70 PICC Right Basilic 4 cm 0 cm (Active)  Indication for Insertion or Continuance of Line Home intravenous therapies (PICC only) 07/16/2018  8:49 PM  Exposed Catheter (cm) 0 cm 07/16/2018  8:49 PM  Site Assessment Clean;Dry;Intact 07/16/2018  8:49 PM  Line Status Blood return noted;Flushed;Saline locked 07/16/2018  8:49 PM  Dressing Type Transparent;Occlusive;Securing device 07/16/2018  8:49 PM  Dressing Status Clean;Dry;Intact;Antimicrobial disc in place 07/16/2018  8:49 PM  Line Adjustment (NICU/IV Team Only) No 07/16/2018  8:49 PM  Dressing Intervention New dressing 07/16/2018  8:49 PM  Dressing Change Due 07/23/18 07/16/2018  8:49 PM       Aldona Lento L 07/16/2018, 9:11 PM

## 2018-07-16 NOTE — Progress Notes (Signed)
Per Devin CVSW [employer] p# [phone]. Eliquis $115.18, Xarelto $114.96  No prior auth required.

## 2018-07-16 NOTE — Progress Notes (Signed)
Occupational Therapy Treatment Patient Details Name: Danielle Dennis MRN: [id number] DOB: [birth year] Today's Date: 07/16/2018    History of present illness 85 year old Caucasian female from  assisted living with past medical history of hypertension, type 2 diabetes mellitus, COPD, hyponatremia hypothyroidism, dCHF who was adm  to ED on 07/03/2018  at [facility] Dennis/t issues with foly catheter not draining & c/o  generalized weakness with difficulty amb, and blood in stool, [facility]ferred to [facility] on 2/16 Dennis/t no GI service at [facility]   OT comments  Pt agreed to BUE exercise to increase I with ADL activity.  Follow Up Recommendations  SNF    Equipment Recommendations  None recommended by OT    Recommendations for Other Services Other (comment)(Palliative Medicine)    Precautions / Restrictions Restrictions Weight Bearing Restrictions: No              ADL either performed or assessed with clinical judgement                  Cognition Arousal/Alertness: Awake/alert Behavior During Therapy: Flat affect;WFL for tasks assessed/performed Overall Cognitive Status: No family/caregiver present to determine baseline cognitive functioning                         Following Commands: Follows one step commands consistently                Exercises Shoulder Exercises Shoulder Flexion: AROM;10 reps;Both Elbow Flexion: AROM;Both Elbow Extension: AROM;Both;10 reps Wrist Flexion: AROM;Both;10 reps Wrist Extension: AROM;Both;10 reps   Shoulder Instructions       General Comments      Pertinent Vitals/ Pain       Pain Score: 2  Pain Location: back Pain Descriptors / Indicators: Dull;Sore Pain Intervention(s): Monitored during session  Home Living Family/patient expects to be discharged to:: Skilled nursing facility                                 Additional Comments: pt is from ALF per chart      Prior Functioning/Environment Level of  Independence: Needs assistance  Gait / Transfers Assistance Needed: pt reports independent transfers at ALF, she is unable to recall how long ago she was ambulatory and states "it's been a awhile" ADL's / Homemaking Assistance Needed: pt reports assist with ADLs   Comments: pt is not a reliable historian, no family present    Frequency  Min 2X/week        Progress Toward Goals  OT Goals(current goals can now be found in the care plan section)  Progress towards OT goals: OT to reassess next treatment     Plan Discharge plan remains appropriate;Frequency remains appropriate       AM-PAC OT "6 Clicks" Daily Activity     Outcome Measure   Help from another person eating meals?: A Little Help from another person taking care of personal grooming?: A Little Help from another person toileting, which includes using toliet, bedpan, or urinal?: Total Help from another person bathing (including washing, rinsing, drying)?: A Lot Help from another person to put on and taking off regular upper body clothing?: A Lot Help from another person to put on and taking off regular lower body clothing?: A Lot 6 Click Score: 13    End of Session Equipment Utilized During Treatment: Oxygen  OT Visit Diagnosis: Unsteadiness on feet (R26.81);Muscle weakness (generalized) (M62.81)  Activity Tolerance Patient tolerated treatment well   Patient Left in bed;with call bell/phone within reach;with bed alarm set   Nurse Communication Mobility status        Time: 8144-8185 OT Time Calculation (min): 12 min  Charges: OT General Charges $OT Visit: 1 Visit OT Treatments $Self Care/Home Management : 8-22 mins  Danielle Dennis, [place] [phone] Office- [phone]      Danielle Dennis, Danielle Dennis 07/16/2018, 10:10 PM

## 2018-07-16 NOTE — Progress Notes (Signed)
Patient daughter is agreeable to SNF placement at [facility] when patient is medically stable to transfer.   Kathrin Greathouse, Marlinda Mike, MSW Clinical Social Worker  [phone] 07/16/2018  8:47 AM

## 2018-07-17 DIAGNOSIS — Z8619 Personal history of other infectious and parasitic diseases: Secondary | ICD-10-CM

## 2018-07-17 DIAGNOSIS — Z22322 Carrier or suspected carrier of Methicillin resistant Staphylococcus aureus: Secondary | ICD-10-CM

## 2018-07-17 DIAGNOSIS — E1169 Type 2 diabetes mellitus with other specified complication: Secondary | ICD-10-CM

## 2018-07-17 LAB — CULTURE, BLOOD (ROUTINE X 2)
CULTURE: NO GROWTH
Culture: NO GROWTH
Special Requests: ADEQUATE
Special Requests: ADEQUATE

## 2018-07-17 LAB — CBC
HCT: 31.2 % — ABNORMAL LOW (ref 36.0–46.0)
Hemoglobin: 9.2 g/dL — ABNORMAL LOW (ref 12.0–15.0)
MCH: 27.3 pg (ref 26.0–34.0)
MCHC: 29.5 g/dL — ABNORMAL LOW (ref 30.0–36.0)
MCV: 92.6 fL (ref 80.0–100.0)
Platelets: 408 10*3/uL — ABNORMAL HIGH (ref 150–400)
RBC: 3.37 MIL/uL — ABNORMAL LOW (ref 3.87–5.11)
RDW: 16.1 % — ABNORMAL HIGH (ref 11.5–15.5)
WBC: 8.3 10*3/uL (ref 4.0–10.5)
nRBC: 0 % (ref 0.0–0.2)

## 2018-07-17 LAB — HEPARIN LEVEL (UNFRACTIONATED): Heparin Unfractionated: 0.4 IU/mL (ref 0.30–0.70)

## 2018-07-17 LAB — GLUCOSE, CAPILLARY
Glucose-Capillary: 143 mg/dL — ABNORMAL HIGH (ref 70–99)
Glucose-Capillary: 154 mg/dL — ABNORMAL HIGH (ref 70–99)
Glucose-Capillary: 134 mg/dL — ABNORMAL HIGH (ref 70–99)
Glucose-Capillary: 139 mg/dL — ABNORMAL HIGH (ref 70–99)

## 2018-07-17 LAB — POTASSIUM: Potassium: 3 mmol/L — ABNORMAL LOW (ref 3.5–5.1)

## 2018-07-17 MED ORDER — POTASSIUM CHLORIDE CRYS ER 20 MEQ PO TBCR
40.0000 meq | EXTENDED_RELEASE_TABLET | Freq: Two times a day (BID) | ORAL | Status: AC
  Administered 2018-07-17 (×2): 40 meq via ORAL
  Filled 2018-07-17 (×2): qty 2

## 2018-07-17 MED ORDER — SODIUM CHLORIDE 0.9 % IV SOLN
1.0000 g | Freq: Three times a day (TID) | INTRAVENOUS | Status: DC
  Administered 2018-07-17 – 2018-07-19 (×7): 1 g via INTRAVENOUS
  Filled 2018-07-17 (×9): qty 1

## 2018-07-17 NOTE — Progress Notes (Signed)
PT Cancellation Note  Patient Details Name: Danielle Dennis MRN: [id number] DOB: 02-May-1934   Cancelled Treatment:    Reason Eval/Treat Not Completed: Fatigue/lethargy limiting ability to participate.  Pt was attempted and declined PT today.  Will try again tomorrow.   Ramond Dial 07/17/2018, 2:57 PM  Mee Hives, PT MS Acute Rehab Dept. Number: [place] and [place]

## 2018-07-17 NOTE — Progress Notes (Signed)
ANTICOAGULATION CONSULT NOTE - Consult  Pharmacy Consult for Heparin Indication: pulmonary embolus  Allergies  Allergen Reactions  . Codeine Other (See Comments)    Per [facility]   Patient Measurements: Height: 5\' 2"  (157.5 cm) Weight: 209 lb 1.6 oz (94.8 kg) IBW/kg (Calculated) : 50.1 Heparin Dosing Weight: 72 kg  Vital Signs: Temp: 98.2 F (36.8 C) (02/26 0445) Temp Source: Oral (02/26 0445) BP: 140/81 (02/26 0445) Pulse Rate: 76 (02/26 0445)  Labs: Recent Labs    07/14/18 1655  07/15/18 0432 07/16/18 0429 07/16/18 1342 07/17/18 0340  HGB  --    < > 9.8* 9.6*  --  9.2*  HCT  --   --  32.9* 32.2*  --  31.2*  PLT  --   --  423* 390  --  408*  HEPARINUNFRC  --    < > 0.35 0.31 0.39 0.40  CREATININE 0.96  --  0.94 0.85  --   --    < > = values in this interval not displayed.   Estimated Creatinine Clearance: 54.8 mL/min (by C-G formula based on SCr of 0.85 mg/dL).  Medical History: Past Medical History:  Diagnosis Date  . CHF (congestive heart failure) ([place])   . COPD (chronic obstructive pulmonary disease) ([place])   . Diabetes mellitus without complication ([place])   . GERD (gastroesophageal reflux disease)   . Hypertension   . Hypothyroidism   . Presence of permanent cardiac pacemaker    Assessment: 85 y/o F admitted initially admitted with GIB found to have PE. Per notes, no further hematemtesis. GI signed off 2/19. CBC stable x 3 days.   07/17/2018:  1342 Heparin level = 0.40 units/ml on Heparin 1450 units/hr  CBC: Hgb low-stable, Plt 408  2/24 Disc aspiration completed per IR - Heparin resumed 2100  Goal of Therapy:  Heparin level 0.3-0.7 units/ml Monitor platelets by anticoagulation protocol: Yes   Plan:   Continue heparin infusion at 1450 units/hr  Daily CBC/HL  Anticipate change to oral DOAC  Minda Ditto PharmD Pager [phone] 07/17/2018, 11:52 AM

## 2018-07-17 NOTE — Progress Notes (Signed)
Patient ID: Danielle Dennis, female   DOB: 08-Nov-1933, 85 y.o.   MRN: [id number]         [facility] for Infectious Disease  Date of Admission:  07/07/2018   Total days of antibiotics 14                 ASSESSMENT: She has developed acute on chronic back pain that is quite severe after being hospitalized at [facility] in mid January with ESBL producing E. coli bacteremia and UTI..  CT shows evidence of L2-3 discitis/osteomyelitis and pneumonia.  She underwent aspirate of L2-3 blood cultures are negative at 48 hours.  No fluid was found but sterile saline was injected and aspirated.  No organisms were seen on stain.  Cultures may very well remain negative because of recent antibiotic therapy.  Her MRSA PCR is positive.  I plan on treating for 6 weeks with meropenem and vancomycin to cover E. coli and more common staph pathogens.  PLAN: 1. Continue meropenem and vancomycin  Diagnosis: Lumbar discitis  Culture Result: Pending  Allergies  Allergen Reactions  . Codeine Other (See Comments)    Per [facility]    OPAT Orders Discharge antibiotics: Per pharmacy protocol vancomycin and meropenem Aim for Vancomycin trough 15-20 (unless otherwise indicated) Duration: 6 weeks End Date: 08/20/2018  [facility] Per Protocol:  Labs weekly while on IV antibiotics: _x_ CBC with differential _x_ BMP __ CMP _x_ CRP _x_ ESR _x_ Vancomycin trough __ CK  _x_ Please pull PIC at completion of IV antibiotics __ Please leave PIC in place until doctor has seen patient or been notified  Fax weekly labs to [phone]  Clinic Follow Up Appt: 08/22/2018  Principal Problem:   GI bleed Active Problems:   UTI due to extended-spectrum beta lactamase (ESBL) producing Escherichia coli   (HFpEF) heart failure with preserved ejection fraction (HCC)   T2DM (type 2 diabetes mellitus) ([place])   Discitis of lumbar region   Scheduled Meds: . arformoterol  15 mcg Nebulization BID  . budesonide  (PULMICORT) nebulizer solution  0.25 mg Nebulization BID  . feeding supplement (ENSURE ENLIVE)  237 mL Oral BID BM  . insulin aspart  0-5 Units Subcutaneous QHS  . insulin aspart  0-9 Units Subcutaneous TID WC  . levothyroxine  125 mcg Oral Q0600  . lidocaine  1 patch Transdermal Q24H  . mouth rinse  15 mL Mouth Rinse BID  . nystatin   Topical TID  . pantoprazole  40 mg Oral BID AC  . polyvinyl alcohol  1 drop Both Eyes QHS  . potassium chloride  40 mEq Oral BID   Continuous Infusions: . sodium chloride 250 mL (07/09/18 1702)  . heparin 1,450 Units/hr (07/17/18 0940)  . meropenem (MERREM) IV 1 g (07/17/18 1023)  . vancomycin Stopped (07/16/18 1856)   PRN Meds:.sodium chloride, acetaminophen **OR** acetaminophen, albuterol, docusate sodium, ondansetron **OR** ondansetron (ZOFRAN) IV, polyethylene glycol, sodium chloride flush  Subjective:  She tells me that she is having back pain but cannot tell me how bad he is or if it has gotten worse recently.  Yesterday, her daughter told me that she has had severe, acute on chronic low back pain recently.  Review of Systems: Review of Systems  Unable to perform ROS: Mental acuity    Allergies  Allergen Reactions  . Codeine Other (See Comments)    Per MAR    OBJECTIVE: Vitals:   07/16/18 2144 07/17/18 0442 07/17/18 0445 07/17/18 1405  BP: Marland Kitchen)  134/57  140/81 (!) 141/63  Pulse: 76  76 69  Resp: _0 Temp: 98.6 F (37 C)  98.2 F (36.8 C) 98.3 F (36.8 C)  TempSrc: Oral  Oral Oral  SpO2: 95%  98% 97%  Weight:  94.8 kg    Height:       Body mass index is 38.24 kg/m.  Physical Exam Constitutional:      Comments: She is more alert today.  She is resting quietly in bed.  Cardiovascular:     Rate and Rhythm: Normal rate and regular rhythm.     Heart sounds: No murmur.  Pulmonary:     Effort: Pulmonary effort is normal.     Breath sounds: Normal breath sounds.     Lab Results Lab Results  Component Value Date   WBC  8.3 07/17/2018   HGB 9.2 (L) 07/17/2018   HCT 31.2 (L) 07/17/2018   MCV 92.6 07/17/2018   PLT 408 (H) 07/17/2018    Lab Results  Component Value Date   CREATININE 0.85 07/16/2018   BUN 6 (L) 07/16/2018   NA 142 07/16/2018   K 3.0 (L) 07/17/2018   CL 114 (H) 07/16/2018   CO2 22 07/16/2018    Lab Results  Component Value Date   ALT 27 07/12/2018   AST 25 07/12/2018   ALKPHOS 79 07/12/2018   BILITOT 0.5 07/12/2018     Microbiology: Recent Results (from the past 240 hour(s))  Culture, blood (routine x 2)     Status: None   Collection Time: 07/07/18  3:40 PM  Result Value Ref Range Status   Specimen Description RIGHT ANTECUBITAL  Final   Special Requests   Final    BOTTLES DRAWN AEROBIC AND ANAEROBIC Blood Culture adequate volume Performed at [facility], [place] [address], [place], [place] [address]    Culture NO GROWTH 5 DAYS  Final   Report Status 07/12/2018 FINAL  Final  Culture, blood (routine x 2)     Status: None   Collection Time: 07/07/18  3:50 PM  Result Value Ref Range Status   Specimen Description BLOOD RIGHT ARM  Final   Special Requests   Final    BOTTLES DRAWN AEROBIC ONLY Blood Culture adequate volume Performed at [facility], [place] [address], [place], [place] [address]    Culture NO GROWTH 5 DAYS  Final   Report Status 07/12/2018 FINAL  Final  MRSA PCR Screening     Status: Abnormal   Collection Time: 07/09/18 11:43 AM  Result Value Ref Range Status   MRSA by PCR POSITIVE (A) NEGATIVE Final    Comment:        The GeneXpert MRSA Assay (FDA approved for NASAL specimens only), is one component of a comprehensive MRSA colonization surveillance program. It is not intended to diagnose MRSA infection nor to guide or monitor treatment for MRSA infections. RESULT CALLED TO, READ BACK BY AND VERIFIED WITH: C.ELLIS AT 1544 ON 07/09/18 BY N.THOMPSON Performed at [facility], [place] [address],  [place], [place] [address]   Culture, blood (routine x 2)     Status: None   Collection Time: 07/12/18  7:00 PM  Result Value Ref Range Status   Specimen Description BLOOD SITE NOT SPECIFIED  Final   Special Requests   Final    BOTTLES DRAWN AEROBIC AND ANAEROBIC Blood Culture adequate volume Performed at [place] [address] [place], [place] [address]  Culture   Final    NO GROWTH 5 DAYS Performed at [facility], [place] [address], [place], [place] [address]    Report Status 07/17/2018 FINAL  Final  Culture, blood (routine x 2)     Status: None   Collection Time: 07/12/18  7:16 PM  Result Value Ref Range Status   Specimen Description   Final    BLOOD Performed at [place] [address], [place], [place] [address]    Special Requests   Final    BOTTLES DRAWN AEROBIC ONLY Blood Culture adequate volume Performed at [place] [address] [place], [place] [address]    Culture   Final    NO GROWTH 5 DAYS Performed at [facility], [place] [address] [place], [place] [address]    Report Status 07/17/2018 FINAL  Final  Aerobic/Anaerobic Culture (surgical/deep wound)     Status: None (Preliminary result)   Collection Time: 07/15/18  4:32 PM  Result Value Ref Range Status   Specimen Description   Final    WOUND DEEP WOUND 12 TO 13 DISC ASPIRATION Performed at [place] [address], [place], [place] [address]    Special Requests   Final    NONE Performed at [facility], [place] [address], [place], [place] [address]    Gram Stain   Final    RARE WBC PRESENT, PREDOMINANTLY PMN NO ORGANISMS SEEN    Culture   Final    NO GROWTH 2 DAYS NO ANAEROBES ISOLATED; CULTURE IN PROGRESS FOR 5 DAYS Performed at [place] [address], [place], [place] [address]    Report Status PENDING  Incomplete    Michel Bickers, MD [facility] for Infectious  [facility] [phone] pager   [phone] cell 07/17/2018, 3:37 PM

## 2018-07-17 NOTE — Progress Notes (Signed)
PROGRESS NOTE    Danielle Dennis  BHA:[id number] DOB: 06/30/1933 DOA: 07/07/2018 PCP: Venetia Maxon, Sharon Mt, MD    Brief Narrative:  85 y.o.femalewith medical history significant ofHFpEF, COPD, T2DM, HTN, Hypothyroidism, ESBL e coli bacteremia and multiple other medical problems who presented to [place] hospital initially with concern for the "foley not draining right all day" on 07/03/18. Pt also noted generalized weakness and difficulty ambulating. Urine cx from 06/29/2018 showed ESBL E. Coli and pan sensitive E faecium. Pt follows with Dr. Emi Holes (urology, who had started pt on ceftin and then subsequently changed to augmentin). Initially plan was for observation and d/c to SNF on 2/13, but she had severe hypokalemia to 2.1 and acute kidney injury. On 2/14, plan for d/c to SNF, but pt had large bowel movement with blood. Pt was monitored another night and did not have any additional episdoes for blood in stool or black stool, but then started having episodes of coffee-ground low volume emesis.  GI was consulted.  No further signs of hematemesis and rectal bleeding probably from stercoral colitis.  GI signed off on 07/10/2018.  Hospital course complicated by pulmonary embolism, clinically undetermined if chronic, and suspected discitis at L2-L3.  Disc aspirated by IR on 07/15/18. Not enough sample removed. ID following and rec c/w IV vanc and IV meropenem. IV team consulted for PICC line plaement.  Assessment & Plan:   Principal Problem:   GI bleed Active Problems:   UTI due to extended-spectrum beta lactamase (ESBL) producing Escherichia coli   (HFpEF) heart failure with preserved ejection fraction (HCC)   T2DM (type 2 diabetes mellitus) (HCC)   Discitis of lumbar region   Resolved upper GI bleed Hemoglobin stable around 9 Continue with PPI twice daily. GI consulted and recommended no further plans for endoscopy at this time.   Right pulmonary embolism with mild right heart  strain CTA positive for PE on 07/12/2018. Started on IV heparin and transition to Eliquis on discharge.    ACute on chronic respiratory failure with hypoxia secondary to pulmonary embolism Improving Nasal cannula oxygen to keep sats greater than 90%.   L2-L3 discitis/osteomyelitis IR consulted and she underwent aspiration on 07/15/2018.  And specimen sent for cultures. ID consulted and recommended IV vancomycin and meropenem.  PICC line placed.    Candiduria in the setting of indwelling Foley catheter which was removed on 07/13/2018. Patient denies any pain at this time.   Bilateral healthcare associated pneumonia Completed a course of 5 days of broad-spectrum IV antibiotics.   Hypokalemia, hypomagnesemia Replace repeat in the morning.   Type 2 diabetes mellitus Uncontrolled with hyperglycemia Hemoglobin A1c is 8.1 Continue with sliding scale insulin.   AKI Resolved with hydration.    Acute encephalopathy possibly from infection Resolved   Hydronephrosis CT showed showed mild to moderate right hydroureteronephrosis terminating at the junction of middle and distal third right ureter. Recommend outpatient follow-up with urology.   Essential hypertension Blood pressure uncontrolled.   Chronic diastolic heart failure She appears to be compensated at this time.    Hypothyroidism Continue with Synthroid.  DVT prophylaxis: Lovenox Code Status: DNR Family Communication: None at bedside  disposition Plan: SNF when bed is available  Consultants:   Gastroenterology  Infectious disease  IR  Procedures: L2-L3 disc disc aspiration Antimicrobials: Vancomycin, meropenem  Subjective: Patient reports her pain is better controlled.  Currently requiring lidocaine patch only.  Objective: Vitals:   07/16/18 2144 07/17/18 0442 07/17/18 0445 07/17/18 1405  BP: (!) 134/57  140/81 Marland Kitchen)  141/63  Pulse: 76  76 69  Resp: 20  18 17   Temp: 98.6 F (37 C)  98.2 F  (36.8 C) 98.3 F (36.8 C)  TempSrc: Oral  Oral Oral  SpO2: 95%  98% 97%  Weight:  94.8 kg    Height:        Intake/Output Summary (Last 24 hours) at 07/17/2018 1444 Last data filed at 07/17/2018 1029 Gross per 24 hour  Intake 1153.65 ml  Output -  Net 1153.65 ml   Filed Weights   07/15/18 0611 07/16/18 0517 07/17/18 0442  Weight: 95.7 kg 95.9 kg 94.8 kg    Examination:  General exam: Appears calm and comfortable  Respiratory system: Clear to auscultation. Respiratory effort normal. Cardiovascular system: S1 & S2 heard, RRR. No JVD, murmurs, rubs, gallops or clicks. No pedal edema. Gastrointestinal system: Abdomen is nondistended, soft and nontender. No organomegaly or masses felt. Normal bowel sounds heard. Central nervous system: Alert and oriented. No focal neurological deficits. Extremities: Symmetric 5 x 5 power. Skin: No rashes, lesions or ulcers Psychiatry: Judgement and insight appear normal. Mood & affect appropriate.     Data Reviewed: I have personally reviewed following labs and imaging studies  CBC: Recent Labs  Lab 07/11/18 0726 07/12/18 0414 07/13/18 0234 07/14/18 0502 07/15/18 0432 07/16/18 0429 07/17/18 0340  WBC 14.7* 15.1* 16.2* 11.4* 10.8* 7.2 8.3  NEUTROABS 11.6* 11.1*  --   --   --   --   --   HGB 11.1* 10.9* 10.6* 9.5* 9.8* 9.6* 9.2*  HCT 35.9* 35.7* 35.7* 31.9* 32.9* 32.2* 31.2*  MCV 90.4 90.6 93.5 91.4 91.6 92.0 92.6  PLT 372 403* 351 430* 423* 390 469*   Basic Metabolic Panel: Recent Labs  Lab 07/11/18 0726 07/12/18 0414  07/13/18 1613 07/14/18 0502 07/14/18 1655 07/15/18 0432 07/16/18 0429 07/17/18 1034  NA 141 139  --  139 139 138 139 142  --   K 2.3* 2.5*  --  2.6* 2.5* 2.8* 3.1* 3.2* 3.0*  CL 98 98  --  102 105 105 109 114*  --   CO2 29 27  --  25 24 25  21* 22  --   GLUCOSE 221* 163*  --  159* 168* 189* 184* 141*  --   BUN 15 20  --  17 14 11 9  6*  --   CREATININE 1.17* 1.21*   < > 1.08* 0.95 0.96 0.94 0.85  --     CALCIUM 8.3* 7.8*  --  7.9* 7.9* 8.2* 8.1* 8.3*  --   MG 1.4* 1.8  --   --  2.2  --   --   --   --   PHOS  --  3.3  --   --   --   --   --   --   --    < > = values in this interval not displayed.   GFR: Estimated Creatinine Clearance: 54.8 mL/min (by C-G formula based on SCr of 0.85 mg/dL). Liver Function Tests: Recent Labs  Lab 07/12/18 0414  AST 25  ALT 27  ALKPHOS 79  BILITOT 0.5  PROT 6.0*  ALBUMIN 2.5*   No results for input(s): LIPASE, AMYLASE in the last 168 hours. No results for input(s): AMMONIA in the last 168 hours. Coagulation Profile: Recent Labs  Lab 07/12/18 1915  INR 1.33   Cardiac Enzymes: No results for input(s): CKTOTAL, CKMB, CKMBINDEX, TROPONINI in the last 168 hours. BNP (last 3 results) No results  for input(s): PROBNP in the last 8760 hours. HbA1C: No results for input(s): HGBA1C in the last 72 hours. CBG: Recent Labs  Lab 07/16/18 1140 07/16/18 1657 07/16/18 2137 07/17/18 0739 07/17/18 1149  GLUCAP 157* 136* 153* 143* 154*   Lipid Profile: No results for input(s): CHOL, HDL, LDLCALC, TRIG, CHOLHDL, LDLDIRECT in the last 72 hours. Thyroid Function Tests: No results for input(s): TSH, T4TOTAL, FREET4, T3FREE, THYROIDAB in the last 72 hours. Anemia Panel: No results for input(s): VITAMINB12, FOLATE, FERRITIN, TIBC, IRON, RETICCTPCT in the last 72 hours. Sepsis Labs: Recent Labs  Lab 07/11/18 1542  07/12/18 0628 07/12/18 1242 07/12/18 1407 07/12/18 1703  PROCALCITON 0.15  --   --   --   --   --   LATICACIDVEN 3.1*   < > 1.9 4.0* 4.4* 3.1*   < > = values in this interval not displayed.    Recent Results (from the past 240 hour(s))  Culture, Urine     Status: Abnormal   Collection Time: 07/07/18  2:48 PM  Result Value Ref Range Status   Specimen Description   Final    URINE, RANDOM Performed at [place] [address] [place], [place] [address]    Special Requests   Final    NONE Performed at [facility], [place] [address], [place], [place] [address]    Culture 30,000 COLONIES/mL YEAST (A)  Final   Report Status 07/09/2018 FINAL  Final  Culture, blood (routine x 2)     Status: None   Collection Time: 07/07/18  3:40 PM  Result Value Ref Range Status   Specimen Description RIGHT ANTECUBITAL  Final   Special Requests   Final    BOTTLES DRAWN AEROBIC AND ANAEROBIC Blood Culture adequate volume Performed at [place] [address] [place], [place] [address]    Culture NO GROWTH 5 DAYS  Final   Report Status 07/12/2018 FINAL  Final  Culture, blood (routine x 2)     Status: None   Collection Time: 07/07/18  3:50 PM  Result Value Ref Range Status   Specimen Description BLOOD RIGHT ARM  Final   Special Requests   Final    BOTTLES DRAWN AEROBIC ONLY Blood Culture adequate volume Performed at [facility], [place] [address], [place], [place] [address]    Culture NO GROWTH 5 DAYS  Final   Report Status 07/12/2018 FINAL  Final  MRSA PCR Screening     Status: Abnormal   Collection Time: 07/09/18 11:43 AM  Result Value Ref Range Status   MRSA by PCR POSITIVE (A) NEGATIVE Final    Comment:        The GeneXpert MRSA Assay (FDA approved for NASAL specimens only), is one component of a comprehensive MRSA colonization surveillance program. It is not intended to diagnose MRSA infection nor to guide or monitor treatment for MRSA infections. RESULT CALLED TO, READ BACK BY AND VERIFIED WITH: C.ELLIS AT 1544 ON 07/09/18 BY N.THOMPSON Performed at [facility], [place] [address], [place], [place] [address]   Culture, blood (routine x 2)     Status: None   Collection Time: 07/12/18  7:00 PM  Result Value Ref Range Status   Specimen Description BLOOD SITE NOT SPECIFIED  Final   Special Requests   Final    BOTTLES DRAWN AEROBIC AND ANAEROBIC Blood Culture adequate volume Performed at [place] [address], [place], [place] [address]  Culture   Final    NO GROWTH 5 DAYS Performed at [facility], [place] [address], [place], [place] [address]    Report Status 07/17/2018 FINAL  Final  Culture, blood (routine x 2)     Status: None   Collection Time: 07/12/18  7:16 PM  Result Value Ref Range Status   Specimen Description   Final    BLOOD Performed at [place] [address], [place], [place] [address]    Special Requests   Final    BOTTLES DRAWN AEROBIC ONLY Blood Culture adequate volume Performed at [place] [address] [place], [place] [address]    Culture   Final    NO GROWTH 5 DAYS Performed at [facility], [place] [address], [place], [place] [address]    Report Status 07/17/2018 FINAL  Final  Aerobic/Anaerobic Culture (surgical/deep wound)     Status: None (Preliminary result)   Collection Time: 07/15/18  4:32 PM  Result Value Ref Range Status   Specimen Description   Final    WOUND DEEP WOUND 12 TO 13 DISC ASPIRATION Performed at [place] [address], [place], [place] [address]    Special Requests   Final    NONE Performed at [facility], [place] [address] [place], [place] [address]    Gram Stain   Final    RARE WBC PRESENT, PREDOMINANTLY PMN NO ORGANISMS SEEN    Culture   Final    NO GROWTH 2 DAYS NO ANAEROBES ISOLATED; CULTURE IN PROGRESS FOR 5 DAYS Performed at [place] [address] [place],  [address]    Report Status PENDING  Incomplete         Radiology Studies: Ct Guided Needle Placement  Result Date: 07/15/2018 INDICATION: 85 year old female with a history of possible discitis/osteomyelitis EXAM: CT-GUIDED NEEDLE PLACEMENT FOR ASPIRATION OF DISCITIS/OSTEOMYELITIS MEDICATIONS: The patient is currently admitted to the hospital and receiving intravenous antibiotics. The antibiotics were administered within an appropriate  time frame prior to the initiation of the procedure. COMPARISON:  CT 07/12/2018 ANESTHESIA/SEDATION: Fentanyl 50 mcg IV; Versed 1.5 mg IV Moderate Sedation Time:  10 minutes The patient was continuously monitored during the procedure by the interventional radiology nurse under my direct supervision. COMPLICATIONS: None PROCEDURE: Informed written consent was obtained from the patient after a thorough discussion of the procedural risks, benefits and alternatives. All questions were addressed. Maximal Sterile Barrier Technique was utilized including caps, mask, sterile gowns, sterile gloves, sterile drape, hand hygiene and skin antiseptic. A timeout was performed prior to the initiation of the procedure. Patient position prone on the CT gantry table. Scout CT of the lumbar region was performed for planning purposes. Using the prior imaging, the scout image, and the toe program, the L2-L3 level was identified. Note that the L2-L3 level, actually corresponds to an L3-L4 level as the patient has 6 lumbar type non rib-bearing vertebral bodies. The prior CT designated the level and L2-L3 with an aero, and this was the level that was targeted. The patient is prepped and draped in the usual sterile fashion. 1% lidocaine was used for local anesthesia. Using CT guidance, 18 gauge trocar needle was advanced to the disc space. Attempt at aspiration was performed with no native fluid withdrawn. 1 cc-2 cc of sterile saline were then infused through the needle, with subsequent aspirate of small volume of fluid. This small volume was sent for culture. Sterile  bandage was placed. Patient tolerated the procedure well and remained hemodynamically stable throughout. No complications were encountered and no significant blood loss. IMPRESSION: Status post CT-guided disc aspiration of L2-L3 (using the numbering system on the most recent comparison CT). Small volume of sterile wash was sent for culture, as no native fluid could be  aspirated. Signed, Dulcy Fanny. Dellia Nims, RPVI [facility] Electronically Signed   By: Corrie Mckusick D.O.   On: 07/15/2018 17:28   [place] Ekg Site Rite  Result Date: 07/16/2018 If Site Rite image not attached, placement could not be confirmed due to current cardiac rhythm.       Scheduled Meds: . arformoterol  15 mcg Nebulization BID  . budesonide (PULMICORT) nebulizer solution  0.25 mg Nebulization BID  . feeding supplement (ENSURE ENLIVE)  237 mL Oral BID BM  . insulin aspart  0-5 Units Subcutaneous QHS  . insulin aspart  0-9 Units Subcutaneous TID WC  . levothyroxine  125 mcg Oral Q0600  . lidocaine  1 patch Transdermal Q24H  . mouth rinse  15 mL Mouth Rinse BID  . nystatin   Topical TID  . pantoprazole  40 mg Oral BID AC  . polyvinyl alcohol  1 drop Both Eyes QHS   Continuous Infusions: . sodium chloride 250 mL (07/09/18 1702)  . heparin 1,450 Units/hr (07/17/18 0940)  . meropenem (MERREM) IV 1 g (07/17/18 1023)  . vancomycin Stopped (07/16/18 1856)     LOS: 10 days    Time spent: 35 minutes.     Hosie Poisson, MD [facility] Pager [phone]   If 7PM-7AM, please contact night-coverage [email or web address] Password [facility] 07/17/2018, 2:44 PM

## 2018-07-18 DIAGNOSIS — Z8701 Personal history of pneumonia (recurrent): Secondary | ICD-10-CM

## 2018-07-18 DIAGNOSIS — G8929 Other chronic pain: Secondary | ICD-10-CM

## 2018-07-18 LAB — CBC
HCT: 32.3 % — ABNORMAL LOW (ref 36.0–46.0)
Hemoglobin: 9.6 g/dL — ABNORMAL LOW (ref 12.0–15.0)
MCH: 27.4 pg (ref 26.0–34.0)
MCHC: 29.7 g/dL — ABNORMAL LOW (ref 30.0–36.0)
MCV: 92.3 fL (ref 80.0–100.0)
PLATELETS: 379 10*3/uL (ref 150–400)
RBC: 3.5 MIL/uL — AB (ref 3.87–5.11)
RDW: 16.1 % — ABNORMAL HIGH (ref 11.5–15.5)
WBC: 8.7 10*3/uL (ref 4.0–10.5)
nRBC: 0 % (ref 0.0–0.2)

## 2018-07-18 LAB — MAGNESIUM: Magnesium: 1.8 mg/dL (ref 1.7–2.4)

## 2018-07-18 LAB — BASIC METABOLIC PANEL
Anion gap: 8 (ref 5–15)
BUN: 5 mg/dL — ABNORMAL LOW (ref 8–23)
CO2: 22 mmol/L (ref 22–32)
Calcium: 8.6 mg/dL — ABNORMAL LOW (ref 8.9–10.3)
Chloride: 110 mmol/L (ref 98–111)
Creatinine, Ser: 0.74 mg/dL (ref 0.44–1.00)
GFR calc non Af Amer: >60 mL/min (ref >60)
Glucose, Bld: 163 mg/dL — ABNORMAL HIGH (ref 70–99)
Potassium: 3.5 mmol/L (ref 3.5–5.1)
Sodium: 140 mmol/L (ref 135–145)

## 2018-07-18 LAB — GLUCOSE, CAPILLARY
Glucose-Capillary: 151 mg/dL — ABNORMAL HIGH (ref 70–99)
Glucose-Capillary: 134 mg/dL — ABNORMAL HIGH (ref 70–99)
Glucose-Capillary: 136 mg/dL — ABNORMAL HIGH (ref 70–99)
Glucose-Capillary: 132 mg/dL — ABNORMAL HIGH (ref 70–99)

## 2018-07-18 LAB — VANCOMYCIN, PEAK: VANCOMYCIN PK: 32 ug/mL (ref 30–40)

## 2018-07-18 LAB — VANCOMYCIN, TROUGH: Vancomycin Tr: 11 ug/mL — ABNORMAL LOW (ref 15–20)

## 2018-07-18 LAB — HEPARIN LEVEL (UNFRACTIONATED): Heparin Unfractionated: 0.33 IU/mL (ref 0.30–0.70)

## 2018-07-18 MED ORDER — ENSURE ENLIVE PO LIQD: 237.0000 mL | Freq: Two times a day (BID) | ORAL | 12 refills | Status: DC

## 2018-07-18 MED ORDER — POLYETHYLENE GLYCOL 3350 17 G PO PACK: 17.0000 g | PACK | Freq: Every day | ORAL | 0 refills | Status: DC | PRN

## 2018-07-18 MED ORDER — VANCOMYCIN IV (FOR PTA / DISCHARGE USE ONLY): 1250.0000 mg | INTRAVENOUS | 0 refills | Status: DC

## 2018-07-18 MED ORDER — DOCUSATE SODIUM 100 MG PO CAPS: 100.0000 mg | ORAL_CAPSULE | Freq: Every day | ORAL | 0 refills | Status: DC | PRN

## 2018-07-18 MED ORDER — APIXABAN 5 MG PO TABS: 5.0000 mg | ORAL_TABLET | Freq: Two times a day (BID) | ORAL | 1 refills | Status: DC

## 2018-07-18 MED ORDER — LOPERAMIDE HCL 2 MG PO CAPS
2.0000 mg | ORAL_CAPSULE | ORAL | Status: DC | PRN
  Administered 2018-07-18 – 2018-07-19 (×2): 2 mg via ORAL
  Filled 2018-07-18 (×2): qty 1

## 2018-07-18 MED ORDER — APIXABAN 5 MG PO TABS: 5.0000 mg | ORAL_TABLET | Freq: Two times a day (BID) | ORAL | Status: DC

## 2018-07-18 MED ORDER — APIXABAN 5 MG PO TABS
10.0000 mg | ORAL_TABLET | Freq: Two times a day (BID) | ORAL | Status: DC
  Administered 2018-07-18 – 2018-07-19 (×3): 10 mg via ORAL
  Filled 2018-07-18 (×3): qty 2

## 2018-07-18 MED ORDER — APIXABAN 5 MG PO TABS: 10.0000 mg | ORAL_TABLET | Freq: Two times a day (BID) | ORAL | 0 refills | Status: DC

## 2018-07-18 MED ORDER — ALBUTEROL SULFATE (2.5 MG/3ML) 0.083% IN NEBU: 2.5000 mg | INHALATION_SOLUTION | Freq: Four times a day (QID) | RESPIRATORY_TRACT | 12 refills | Status: DC | PRN

## 2018-07-18 MED ORDER — MEROPENEM IV (FOR PTA / DISCHARGE USE ONLY): 1.0000 g | Freq: Three times a day (TID) | INTRAVENOUS | 0 refills | Status: AC

## 2018-07-18 NOTE — Progress Notes (Signed)
Patient ID: Danielle Dennis, female   DOB: 03/08/1934, 84 y.o.   MRN: [id number]         [facility]  Date of Admission:  07/07/2018   Total days of antibiotics 15                 ASSESSMENT: She has developed acute on chronic back pain after being hospitalized at [facility] in mid January with ESBL producing E. coli bacteremia and UTI..  CT shows evidence of L2-3 discitis/osteomyelitis and pneumonia.  She underwent aspirate of L2-3 blood cultures are negative at 72 hours.  Cultures may very well remain negative because of recent antibiotic therapy.  Her MRSA PCR is positive.  I plan on treating for 6 weeks with meropenem and vancomycin to cover E. coli and more common staph pathogens.  PLAN: 1. Continue meropenem and vancomycin  Diagnosis: Lumbar discitis  Culture Result: Pending  Allergies  Allergen Reactions  . Codeine Other (See Comments)    Per MAR    OPAT Orders Discharge antibiotics: Per pharmacy protocol vancomycin and meropenem Aim for Vancomycin trough 15-20 (unless otherwise indicated) Duration: 6 weeks End Date: 08/20/2018  PIC Care Per Protocol:  Labs weekly while on IV antibiotics: _x_ CBC with differential _x_ BMP __ CMP _x_ CRP _x_ ESR _x_ Vancomycin trough __ CK  _x_ Please pull PIC at completion of IV antibiotics __ Please leave PIC in place until doctor has seen patient or been notified  Fax weekly labs to [phone]  Clinic Follow Up Appt: 08/22/2018  Principal Problem:   GI bleed Active Problems:   UTI due to extended-spectrum beta lactamase (ESBL) producing Escherichia coli   (HFpEF) heart failure with preserved ejection fraction (HCC)   T2DM (type 2 diabetes mellitus) (HCC)   Discitis of lumbar region   Scheduled Meds: . arformoterol  15 mcg Nebulization BID  . budesonide (PULMICORT) nebulizer solution  0.25 mg Nebulization BID  . feeding supplement (ENSURE ENLIVE)  237 mL Oral BID BM  . insulin  aspart  0-5 Units Subcutaneous QHS  . insulin aspart  0-9 Units Subcutaneous TID WC  . levothyroxine  125 mcg Oral Q0600  . lidocaine  1 patch Transdermal Q24H  . mouth rinse  15 mL Mouth Rinse BID  . nystatin   Topical TID  . pantoprazole  40 mg Oral BID AC  . polyvinyl alcohol  1 drop Both Eyes QHS   Continuous Infusions: . sodium chloride 250 mL (07/17/18 2054)  . heparin Stopped (07/17/18 1450)  . meropenem (MERREM) IV 1 g (07/18/18 0001)  . vancomycin 1,250 mg (07/17/18 2054)   PRN Meds:.sodium chloride, acetaminophen **OR** acetaminophen, albuterol, docusate sodium, ondansetron **OR** ondansetron (ZOFRAN) IV, polyethylene glycol, sodium chloride flush  Subjective:  "I think I am doing okay".  Review of Systems: Review of Systems  Unable to perform ROS: Mental acuity    Allergies  Allergen Reactions  . Codeine Other (See Comments)    Per MAR    OBJECTIVE: Vitals:   07/17/18 2132 07/17/18 2155 07/18/18 0449 07/18/18 0510  BP:  (!) 148/72  (!) 132/52  Pulse:  73  79  Resp:  (!) 22  20  Temp:  98.3 F (36.8 C)  98.3 F (36.8 C)  TempSrc:  Oral  Oral  SpO2: 93% 96%  95%  Weight:   95 kg   Height:       Body mass index is 38.31 kg/m.  Physical Exam Constitutional:        Comments: She is resting quietly in bed.  She does not seem to be in pain currently.  Cardiovascular:     Rate and Rhythm: Normal rate and regular rhythm.     Heart sounds: No murmur.     Comments: Distant heart sounds. Pulmonary:     Effort: Pulmonary effort is normal.     Breath sounds: Normal breath sounds.     Lab Results Lab Results  Component Value Date   WBC 8.7 07/18/2018   HGB 9.6 (L) 07/18/2018   HCT 32.3 (L) 07/18/2018   MCV 92.3 07/18/2018   PLT 379 07/18/2018    Lab Results  Component Value Date   CREATININE 0.85 07/16/2018   BUN 6 (L) 07/16/2018   NA 142 07/16/2018   K 3.0 (L) 07/17/2018   CL 114 (H) 07/16/2018   CO2 22 07/16/2018    Lab Results  Component  Value Date   ALT 27 07/12/2018   AST 25 07/12/2018   ALKPHOS 79 07/12/2018   BILITOT 0.5 07/12/2018    Sed Rate (mm/hr)  Date Value  07/12/2018 92 (H)   CRP (mg/dL)  Date Value  07/12/2018 3.4 (H)    Microbiology: Recent Results (from the past 240 hour(s))  MRSA PCR Screening     Status: Abnormal   Collection Time: 07/09/18 11:43 AM  Result Value Ref Range Status   MRSA by PCR POSITIVE (A) NEGATIVE Final    Comment:        The GeneXpert MRSA Assay (FDA approved for NASAL specimens only), is one component of a comprehensive MRSA colonization surveillance program. It is not intended to diagnose MRSA infection nor to guide or monitor treatment for MRSA infections. RESULT CALLED TO, READ BACK BY AND VERIFIED WITH: C.ELLIS AT 1544 ON 07/09/18 BY N.THOMPSON Performed at [facility], [place] [address] [place], [place] [address]   Culture, blood (routine x 2)     Status: None   Collection Time: 07/12/18  7:00 PM  Result Value Ref Range Status   Specimen Description BLOOD SITE NOT SPECIFIED  Final   Special Requests   Final    BOTTLES DRAWN AEROBIC AND ANAEROBIC Blood Culture adequate volume Performed at [facility] [address], [place], [place] [address]    Culture   Final    NO GROWTH 5 DAYS Performed at [facility], [place] [address] [place], [place] [address]    Report Status 07/17/2018 FINAL  Final  Culture, blood (routine x 2)     Status: None   Collection Time: 07/12/18  7:16 PM  Result Value Ref Range Status   Specimen Description   Final    BLOOD Performed at [facility] [address] [place], [place] [address]    Special Requests   Final    BOTTLES DRAWN AEROBIC ONLY Blood Culture adequate volume Performed at [facility] [address] [place], [place] [address]    Culture   Final    NO GROWTH 5 DAYS Performed at [facility], [place] [address], [place],  [place] [address]    Report Status 07/17/2018 FINAL  Final  Aerobic/Anaerobic Culture (surgical/deep wound)     Status: None (Preliminary result)   Collection Time: 07/15/18  4:32 PM  Result Value Ref Range Status   Specimen Description   Final    WOUND DEEP WOUND 12 TO 13 DISC ASPIRATION Performed at [facility] [address]  [place], [place] [address]    Special Requests   Final    NONE Performed at [facility], [address] [place], [place] [address]    Gram Stain   Final    RARE WBC PRESENT, PREDOMINANTLY PMN NO ORGANISMS SEEN    Culture   Final    NO GROWTH 2 DAYS NO ANAEROBES ISOLATED; CULTURE IN PROGRESS FOR 5 DAYS Performed at [facility], [address] , [place] [address]    Report Status PENDING  Incomplete     , MD [facility] [phone] pager   [phone] cell 07/18/2018, 9:13 AM 

## 2018-07-18 NOTE — Discharge Summary (Signed)
Physician Discharge Summary  Danielle Dennis NTZ:[id number] DOB: 01/22/1934 DOA: 07/07/2018  PCP: Emmaline Kluver, MD  Admit date: 07/07/2018 Discharge date: 07/18/2018  Admitted From: snf Disposition:  SNF  Recommendations for Outpatient Follow-up:  1. Follow up with PCP in 1-2 weeks 2. Please obtain BMP/CBC in one week  Discharge Condition:STABLE.  Diet recommendation: Heart Healthy   Brief/Interim Summary: 85 y.o.femalewith medical history significant ofHFpEF, COPD, T2DM, HTN, Hypothyroidism, ESBL e coli bacteremia and multiple other medical problems who presented to [facility] initially with concern for the "foley not draining right all day" on 07/03/18. Pt also noted generalized weakness and difficulty ambulating. Urine cx from 06/29/2018 showed ESBL E. Coli and pan sensitive E faecium. Pt follows with Dr. Emi Holes (urology, who had started pt on ceftin and then subsequently changed to augmentin). Initially plan was for observation and d/c to SNF on 2/13, but she had severe hypokalemia to 2.1 and acute kidney injury. On 2/14, plan for d/c to SNF, but pt had large bowel movement with blood. Pt was monitored another night and did not have any additional episdoes for blood in stool or black stool, but then started having episodes of coffee-ground low volume emesis. GI was consulted. No further signs of hematemesis and rectal bleeding probably from stercoral colitis. GI signed off on 07/10/2018.  Hospital course complicated by pulmonary embolism, clinically undetermined if chronic, and suspected discitis at L2-L3.  Disc aspirated by IR on 07/15/18. Not enough sample removed. ID following and rec c/w IV vanc and IV meropenem. IV team consulted for PICC line plaement.  Discharge Diagnoses:  Principal Problem:   GI bleed Active Problems:   UTI due to extended-spectrum beta lactamase (ESBL) producing Escherichia coli   (HFpEF) heart failure with preserved ejection fraction  (HCC)   T2DM (type 2 diabetes mellitus) (HCC)   Discitis of lumbar region  Resolved upper GI bleed Hemoglobin stable around 9 Continue with PPI twice daily. GI consulted and recommended no further plans for endoscopy at this time.   Right pulmonary embolism with mild right heart strain CTA positive for PE on 07/12/2018. Started on IV heparin and transition to Eliquis on discharge.    ACute on chronic respiratory failure with hypoxia secondary to pulmonary embolism Improving Nasal cannula oxygen to keep sats greater than 90%.   L2-L3 discitis/osteomyelitis IR consulted and she underwent aspiration on 07/15/2018.  And specimen sent for cultures. ID consulted and recommended IV vancomycin and meropenem.  PICC line placed. plan to complete 6 weeks of meropenem and vancomycin as per ID recommendations.     Candiduria in the setting of indwelling Foley catheter which was removed on 07/13/2018. Patient denies any pain at this time.   Bilateral healthcare associated pneumonia Completed a course of 5 days of broad-spectrum IV antibiotics.   Hypokalemia, hypomagnesemia Replaced.    Type 2 diabetes mellitus Better controlled. Resume home meds on discharge.  Hemoglobin A1c is 8.1 CBG (last 3)  Recent Labs    07/17/18 1702 07/17/18 2148 07/18/18 0748  GLUCAP 134* 139* 151*      AKI Resolved with hydration.    Acute encephalopathy possibly from infection Resolved   Hydronephrosis CT showed showed mild to moderate right hydroureteronephrosis terminating at the junction of middle and distal third right ureter. Recommend outpatient follow-up with urology.   Essential hypertension Blood pressure uncontrolled.   Chronic diastolic heart failure She appears to be compensated at this time.    Hypothyroidism Continue with Synthroid.  Discharge Instructions  Discharge  Instructions    Diet - low sodium heart healthy   Complete by:  As  directed    Discharge instructions   Complete by:  As directed    Please follow up with PCP in one week.   Home infusion instructions [facility] May follow [place] Dosing Protocol; May administer Cathflo as needed to maintain patency of vascular access device.; Flushing of vascular access device: per [facility] Protocol: 0.9% NaCl pre/post medica...   Complete by:  As directed    Instructions:  May follow [place] Dosing Protocol   Instructions:  May administer Cathflo as needed to maintain patency of vascular access device.   Instructions:  Flushing of vascular access device: per [facility] Protocol: 0.9% NaCl pre/post medication administration and prn patency; Heparin 100 u/ml, 65m for implanted ports and Heparin 10u/ml, 515mfor all other central venous catheters.   Instructions:  May follow [facility] Anaphylaxis Protocol for First Dose Administration in the home: 0.9% NaCl at 25-50 ml/hr to maintain IV access for protocol meds. Epinephrine 0.3 ml IV/IM PRN and Benadryl 25-50 IV/IM PRN s/s of anaphylaxis.   Instructions:  [facility] Coordinator (RN) to assist per patient IV care needs in the home PRN.     Allergies as of 07/18/2018      Reactions   Codeine Other (See Comments)   Per MAR      Medication List    STOP taking these medications   amLODipine 5 MG tablet Commonly known as:  NORVASC   bumetanide 2 MG tablet Commonly known as:  BUMEX   HYDROcodone-acetaminophen 5-325 MG tablet Commonly known as:  NORCO/VICODIN     TAKE these medications   albuterol (2.5 MG/3ML) 0.083% nebulizer solution Commonly known as:  PROVENTIL Take 3 mLs (2.5 mg total) by nebulization every 6 (six) hours as needed for wheezing or shortness of breath.   apixaban 5 MG Tabs tablet Commonly known as:  ELIQUIS Take 2 tablets (10 mg total) by mouth 2 (two) times daily for 7 days.   apixaban 5 MG Tabs tablet Commonly known as:  ELIQUIS Take 1 tablet (5 mg total) by mouth 2 (two) times  daily. Start taking on:  July 26, 2018   aspirin 81 MG chewable tablet Chew 81 mg by mouth daily.   BREO ELLIPTA 100-25 MCG/INH Aepb Generic drug:  fluticasone furoate-vilanterol Inhale 1 puff into the lungs daily.   CEROVITE SENIOR Tabs Take 1 tablet by mouth daily.   docusate sodium 100 MG capsule Commonly known as:  COLACE Take 1 capsule (100 mg total) by mouth daily as needed for mild constipation.   estradiol 1 MG tablet Commonly known as:  ESTRACE Take 1 mg by mouth daily.   feeding supplement (ENSURE ENLIVE) Liqd Take 237 mLs by mouth 2 (two) times daily between meals.   hydrALAZINE 25 MG tablet Commonly known as:  APRESOLINE Take 25 mg by mouth 3 (three) times daily.   insulin regular 100 units/mL injection Commonly known as:  NOVOLIN R,HUMULIN R Inject 0-10 Units into the skin 4 (four) times daily -  with meals and at bedtime. Per sliding scale: <150 = 0 units 150-200 = 2 units 201-249 = 4 units 250-300 = 6 units 301-349 = 8 units 350-400 = 10 units >400 = Notify MD   levothyroxine 125 MCG tablet Commonly known as:  SYNTHROID, LEVOTHROID Take 125 mcg by mouth daily before breakfast.   magnesium hydroxide 400 MG/5ML suspension Commonly known as:  MILK  OF MAGNESIA Take 30 mLs by mouth daily as needed for mild constipation.   meropenem  IVPB Commonly known as:  MERREM Inject 1 g into the vein every 8 (eight) hours. Indication:  Discitis/osteomyelitis Last Day of Therapy:  08/20/2018 Labs - Once weekly:  CBC/D, BMP, CRP, ESR   metolazone 2.5 MG tablet Commonly known as:  ZAROXOLYN Take 2.5 mg by mouth 2 (two) times a week. On Tuesday and Friday   pantoprazole 40 MG tablet Commonly known as:  PROTONIX Take 40 mg by mouth daily.   polyethylene glycol packet Commonly known as:  MIRALAX / GLYCOLAX Take 17 g by mouth daily as needed for moderate constipation.   polyvinyl alcohol 1.4 % ophthalmic solution Commonly known as:  LIQUIFILM TEARS Place 1  drop into both eyes at bedtime.   potassium chloride SA 20 MEQ tablet Commonly known as:  K-DUR,KLOR-CON Take 40 mEq by mouth 2 (two) times daily.   sitaGLIPtin 50 MG tablet Commonly known as:  JANUVIA Take 50 mg by mouth daily.   vancomycin  IVPB Inject 1,250 mg into the vein daily. Indication:   Discitis/osteomyelitis Last Day of Therapy:  08/20/2018 Labs - weekly on Fridays: CBC/D, BMP, ESR, CRP, and vancomycin trough (next vancomycin level due on 2/28)            Home Infusion Instuctions  (From admission, onward)         Start     Ordered   07/18/18 0000  Home infusion instructions [facility] May follow [place] Dosing Protocol; May administer Cathflo as needed to maintain patency of vascular access device.; Flushing of vascular access device: per [facility] Protocol: 0.9% NaCl pre/post medica...    Question Answer Comment  Instructions May follow [place] Dosing Protocol   Instructions May administer Cathflo as needed to maintain patency of vascular access device.   Instructions Flushing of vascular access device: per [facility] Protocol: 0.9% NaCl pre/post medication administration and prn patency; Heparin 100 u/ml, 35m for implanted ports and Heparin 10u/ml, 527mfor all other central venous catheters.   Instructions May follow [facility] Anaphylaxis Protocol for First Dose Administration in the home: 0.9% NaCl at 25-50 ml/hr to maintain IV access for protocol meds. Epinephrine 0.3 ml IV/IM PRN and Benadryl 25-50 IV/IM PRN s/s of anaphylaxis.   Instructions [facility] Infusion Coordinator (RN) to assist per patient IV care needs in the home PRN.      07/18/18 1135          Contact information for follow-up providers    Street, ChSharon MtMD. Schedule an appointment as soon as possible for a visit in 1 week(s).   Specialty:  Family Medicine Contact information: [phone]            Contact information for  after-discharge care    [place] .   Service:  Skilled Nursing Contact information: [phone]               Allergies  Allergen Reactions  . Codeine Other (See Comments)    Per MAR    Consultations: ID GI IR.   Procedures/Studies: Ct Abdomen Pelvis Wo Contrast  Result Date: 07/07/2018 CLINICAL DATA:  Brownish bowel movement with vomiting and cough a ground emesis. Leukocytosis to 16.1. Assess for gastrointestinal bleed. EXAM: CT ABDOMEN AND PELVIS WITHOUT CONTRAST TECHNIQUE: Multidetector CT  imaging of the abdomen and pelvis was performed following the standard protocol without IV contrast. COMPARISON:  07/06/2010 FINDINGS: Lower chest: New pulmonary consolidations in both lower lobes with air bronchograms consistent with interval development of bilateral pneumonia and atelectasis. Trace left effusion. Top-normal heart size with out pericardial effusion or thickening. Right atrial and ventricular leads are present. Hepatobiliary: The gallbladder is surgically absent. The unenhanced liver is unremarkable. Pancreas: Atrophic pancreas without mass or ductal dilatation. No inflammation. Spleen: Normal Adrenals/Urinary Tract: Normal bilateral adrenal glands. The left kidney is unremarkable. Moderate marked right-sided hydroureteronephrosis and hydroureter is again visualized extending to the pelvic brim where there is a transition in size and caliber of the ureter question stricture. No calculus is identified. No apparent intraluminal mass. Stomach is decompressed by Foley catheter. Stomach/Bowel: Large amount of retained stool is seen within the descending colon through rectosigmoid with transmural thickening suggestive fecal impaction and possible stercoral colitis. Small hiatal hernia. Contrast filled physiologic distention of the stomach. The duodenal sweep and ligament of Treitz are normal.  No small bowel obstruction. The appendix is not confidently identified. Vascular/Lymphatic: Aortoiliac atherosclerosis. No aneurysm. No adenopathy. Scattered pelvic phleboliths are noted. Reproductive: Status post hysterectomy. No adnexal masses. Other: No free air nor free fluid. No hernia. Musculoskeletal: Degenerative disc disease and scoliosis of the lumbar spine. No aggressive osseous lesions. IMPRESSION: 1. New bilateral lower lobe pulmonary consolidations with air bronchograms consistent with interval development of bilateral pneumonia and atelectasis. Trace left effusion. 2. Moderate to marked right-sided hydroureteronephrosis and hydroureter extending nor definite ureteral mass. No significant change from prior. Is identified. 3. Large amount of retained stool within the descending colon through rectosigmoid with slight transmural thickening suggestive of fecal impaction and possible stercoral colitis. 4. Aortoiliac atherosclerosis. Electronically Signed   By: Ashley Royalty M.D.   On: 07/07/2018 20:14   Ct Head Wo Contrast  Result Date: 07/12/2018 CLINICAL DATA:  Confusion and cephalopathy. History of hypertension and diabetes. EXAM: CT HEAD WITHOUT CONTRAST TECHNIQUE: Contiguous axial images were obtained from the base of the skull through the vertex without intravenous contrast. COMPARISON:  CT HEAD November 09, 2011 FINDINGS: BRAIN: No intraparenchymal hemorrhage, mass effect nor midline shift. No parenchymal brain volume loss for age. No hydrocephalus. Patchy supratentorial white matter hypodensities within normal range for patient's age, though non-specific are most compatible with chronic small vessel ischemic disease. No acute large vascular territory infarcts. No abnormal extra-axial fluid collections. Basal cisterns are patent. VASCULAR: Moderate calcific atherosclerosis of the carotid siphons. SKULL: No skull fracture. No significant scalp soft tissue swelling. SINUSES/ORBITS: RIGHT maxillary  mucosal retention cyst. Mastoid air cells are well aerated.The included ocular globes and orbital contents are non-suspicious. Status post bilateral ocular lens implants. OTHER: 2.1 cm chronic LEFT facial sebaceous cyst. IMPRESSION: Negative non-contrast CT HEAD for age. Electronically Signed   By: Elon Alas M.D.   On: 07/12/2018 16:53   Ct Guided Needle Placement  Result Date: 07/15/2018 INDICATION: 85 year old female with a history of possible discitis/osteomyelitis EXAM: CT-GUIDED NEEDLE PLACEMENT FOR ASPIRATION OF DISCITIS/OSTEOMYELITIS MEDICATIONS: The patient is currently admitted to the hospital and receiving intravenous antibiotics. The antibiotics were administered within an appropriate time frame prior to the initiation of the procedure. COMPARISON:  CT 07/12/2018 ANESTHESIA/SEDATION: Fentanyl 50 mcg IV; Versed 1.5 mg IV Moderate Sedation Time:  10 minutes The patient was continuously monitored during the procedure by the interventional radiology nurse under my direct supervision. COMPLICATIONS: None PROCEDURE: Informed written consent was obtained from the patient after  a thorough discussion of the procedural risks, benefits and alternatives. All questions were addressed. Maximal Sterile Barrier Technique was utilized including caps, mask, sterile gowns, sterile gloves, sterile drape, hand hygiene and skin antiseptic. A timeout was performed prior to the initiation of the procedure. Patient position prone on the CT gantry table. Scout CT of the lumbar region was performed for planning purposes. Using the prior imaging, the scout image, and the toe program, the L2-L3 level was identified. Note that the L2-L3 level, actually corresponds to an L3-L4 level as the patient has 6 lumbar type non rib-bearing vertebral bodies. The prior CT designated the level and L2-L3 with an aero, and this was the level that was targeted. The patient is prepped and draped in the usual sterile fashion. 1% lidocaine  was used for local anesthesia. Using CT guidance, 18 gauge trocar needle was advanced to the disc space. Attempt at aspiration was performed with no native fluid withdrawn. 1 cc-2 cc of sterile saline were then infused through the needle, with subsequent aspirate of small volume of fluid. This small volume was sent for culture. Sterile bandage was placed. Patient tolerated the procedure well and remained hemodynamically stable throughout. No complications were encountered and no significant blood loss. IMPRESSION: Status post CT-guided disc aspiration of L2-L3 (using the numbering system on the most recent comparison CT). Small volume of sterile wash was sent for culture, as no native fluid could be aspirated. Signed, Dulcy Fanny. Dellia Nims, RPVI [facility] Electronically Signed   By: Corrie Mckusick D.O.   On: 07/15/2018 17:28   Dg Chest Port 1 View  Result Date: 07/11/2018 CLINICAL DATA:  Hypoxia and cough. EXAM: PORTABLE CHEST 1 VIEW COMPARISON:  06/10/2018 FINDINGS: Left-sided pacemaker unchanged. Lungs are hypoinflated demonstrate worsening airspace opacification over the right perihilar region/medial right base. Mild prominence of the left infrahilar markings. No effusion. Cardiomediastinal silhouette and remainder of the exam is unchanged. IMPRESSION: Worsening opacification over the right infrahilar region/medial right base as well as prominence of the left infrahilar markings. Findings may be due to mild interstitial edema versus infection. Electronically Signed   By: Marin Olp M.D.   On: 07/11/2018 15:45   Ct Angio Chest/abd/pel For Dissection W And/or W/wo  Result Date: 07/12/2018 CLINICAL DATA:  Abdominal pain. EXAM: CT ANGIOGRAPHY CHEST, ABDOMEN AND PELVIS TECHNIQUE: Multidetector CT imaging through the chest, abdomen and pelvis was performed using the standard protocol during bolus administration of intravenous contrast. Multiplanar  reconstructed images and MIPs were obtained and reviewed to evaluate the vascular anatomy. CONTRAST:  10m ISOVUE-370 IOPAMIDOL (ISOVUE-370) INJECTION 76% COMPARISON:  Abdominal CT 07/07/2018 FINDINGS: CTA CHEST FINDINGS Cardiovascular: There is a nonocclusive right pulmonary embolus within the lobar branch of the right upper lobe, extending to the segmental branches. There is a possible smaller nonocclusive pulmonary embolus in a basilar segmental branch in the left lower lobe. There is mild right heart strain with RV/LV ratio of 1.1. Mild atherosclerotic disease of the aorta with no evidence of dissection or aneurysmal dilation. Mediastinum/Nodes: No enlarged mediastinal, hilar, or axillary lymph nodes. Thyroid gland, trachea, and esophagus demonstrate no significant findings. Small hiatal hernia. Lungs/Pleura: Bilateral lower lobe airspace consolidation versus atelectasis. Musculoskeletal: No chest wall abnormality. No acute or significant osseous findings. Review of the MIP images confirms the above findings. CTA ABDOMEN AND PELVIS FINDINGS VASCULAR Aorta: Normal caliber aorta without aneurysm, dissection, vasculitis or significant stenosis. Celiac: Patent without evidence of aneurysm, dissection, vasculitis or significant stenosis.  SMA: Patent without evidence of aneurysm, dissection, vasculitis or significant stenosis. Renals: Both renal arteries are patent without evidence of aneurysm, dissection, vasculitis, fibromuscular dysplasia or significant stenosis. IMA: Patent without evidence of aneurysm, dissection, vasculitis or significant stenosis. Inflow: Patent without evidence of aneurysm, dissection, vasculitis or significant stenosis. Veins: No obvious venous abnormality within the limitations of this arterial phase study. Review of the MIP images confirms the above findings. NON-VASCULAR Hepatobiliary: Hepatic steatosis. Post cholecystectomy. Pancreas: Unremarkable. No pancreatic ductal dilatation or  surrounding inflammatory changes. Spleen: Normal in size without focal abnormality. Adrenals/Urinary Tract: Normal adrenal glands. Normal appearance of the left kidney/left ureter and urinary bladder with Foley catheter in place. There is a right hydronephrosis and hydroureter to the level of the distal third of the right ureter. No obstructive calculus or obvious ureteral mass. Stomach/Bowel: Stomach is within normal limits. No evidence of appendicitis. No evidence of bowel wall thickening, distention, or inflammatory changes. Relatively fluid contents of the colon, may be associated with a diarrheal state. Nonspecific gaseous distension of the colon. Scattered colonic diverticula. Lymphatic: Aortic atherosclerosis. No enlarged abdominal or pelvic lymph nodes. Reproductive: Status post hysterectomy. No adnexal masses. Other: No abdominal wall hernia or abnormality. No abdominopelvic ascites. Musculoskeletal: Scoliosis of the lumbosacral spine. Multilevel osteoarthritic changes. Sclerosis and erosive endplate changes at S5-K8 centered around the disc space. These changes are out of proportion to the degenerative disc disease seen at the remaining levels of the spine. Review of the MIP images confirms the above findings. IMPRESSION: 1. Nonocclusive right pulmonary embolus within the lobar branch of the right upper lobe, extending to the segmental branches. Possible smaller nonocclusive pulmonary embolus in a basilar segmental branch of the left lower lobe. 2. Mild right heart strain with RV/LV ratio of 1. 3. Bilateral lower lobe airspace consolidation versus atelectasis. 4. Hepatic steatosis. 5. Right hydronephrosis and hydroureter to the level of the distal third of the right ureter. No obstructive calculus or obvious ureteral mass. 6. Sclerosis and erosive endplate changes at L2-X5 centered around the disc space. These changes are out of proportion to the osteoarthritic changes at the remaining levels of the spine.  Findings may represent degenerative discogenic changes, however discitis/osteomyelitis may have a similar appearance. 7. Nonspecific gaseous distension of the colon and probable diarrheal state. These results were called by telephone at the time of interpretation on 07/12/2018 at 5:19 pm to Dr. Irene Pap , who verbally acknowledged these results. Electronically Signed   By: Fidela Salisbury M.D.   On: 07/12/2018 17:20   [place] Ekg Site Rite  Result Date: 07/16/2018 If [facility] image not attached, placement could not be confirmed due to current cardiac rhythm.      Subjective:  No new complaints.  Discharge Exam: Vitals:   07/18/18 0510 07/18/18 1038  BP: (!) 132/52 (!) 126/56  Pulse: 79 69  Resp: 20 20  Temp: 98.3 F (36.8 C) 97.7 F (36.5 C)  SpO2: 95% 97%   Vitals:   07/17/18 2155 07/18/18 0449 07/18/18 0510 07/18/18 1038  BP: (!) 148/72  (!) 132/52 (!) 126/56  Pulse: 73  79 69  Resp: (!) '22  20 20  '$ Temp: 98.3 F (36.8 C)  98.3 F (36.8 C) 97.7 F (36.5 C)  TempSrc: Oral  Oral Oral  SpO2: 96%  95% 97%  Weight:  95 kg    Height:        General: Pt is alert, awake, not in acute distress Cardiovascular: RRR, S1/S2 +, no rubs,  no gallops Respiratory: CTA bilaterally, no wheezing, no rhonchi Abdominal: Soft, NT, ND, bowel sounds + Extremities: no edema, no cyanosis    The results of significant diagnostics from this hospitalization (including imaging, microbiology, ancillary and laboratory) are listed below for reference.     Microbiology: Recent Results (from the past 240 hour(s))  MRSA PCR Screening     Status: Abnormal   Collection Time: 07/09/18 11:43 AM  Result Value Ref Range Status   MRSA by PCR POSITIVE (A) NEGATIVE Final    Comment:        The GeneXpert MRSA Assay (FDA approved for NASAL specimens only), is one component of a comprehensive MRSA colonization surveillance program. It is not intended to diagnose MRSA infection nor to guide  or monitor treatment for MRSA infections. RESULT CALLED TO, READ BACK BY AND VERIFIED WITH: C.ELLIS AT 1544 ON 07/09/18 BY N.THOMPSON Performed at [facility], [place] [address], [place], [place] [address]   Culture, blood (routine x 2)     Status: None   Collection Time: 07/12/18  7:00 PM  Result Value Ref Range Status   Specimen Description BLOOD SITE NOT SPECIFIED  Final   Special Requests   Final    BOTTLES DRAWN AEROBIC AND ANAEROBIC Blood Culture adequate volume Performed at [facility] [address], [place], [place] [address]    Culture   Final    NO GROWTH 5 DAYS Performed at [facility], [place] [address], [place], [place] [address]    Report Status 07/17/2018 FINAL  Final  Culture, blood (routine x 2)     Status: None   Collection Time: 07/12/18  7:16 PM  Result Value Ref Range Status   Specimen Description   Final    BLOOD Performed at [facility] [address] [address] [place], [place] [address]    Special Requests   Final    BOTTLES DRAWN AEROBIC ONLY Blood Culture adequate volume Performed at [facility] [address] [place], [place] [address]    Culture   Final    NO GROWTH 5 DAYS Performed at [facility], [place] [address] [place], [place] [address]    Report Status 07/17/2018 FINAL  Final  Aerobic/Anaerobic Culture (surgical/deep wound)     Status: None (Preliminary result)   Collection Time: 07/15/18  4:32 PM  Result Value Ref Range Status   Specimen Description   Final    WOUND DEEP WOUND 12 TO 13 DISC ASPIRATION Performed at [facility] [address], [place], [place] [address]    Special Requests   Final    NONE Performed at [facility], [place] [address], [place], [place] [address]    Gram Stain   Final    RARE WBC PRESENT, PREDOMINANTLY PMN NO ORGANISMS SEEN    Culture   Final    NO GROWTH 2 DAYS NO ANAEROBES ISOLATED;  CULTURE IN PROGRESS FOR 5 DAYS Performed at [facility] [address] [place], [place] [address]    Report Status PENDING  Incomplete     Labs: BNP (last 3 results) No results for input(s): BNP in the last 8760 hours. Basic Metabolic Panel: Recent Labs  Lab 07/12/18 0414  07/14/18 0502 07/14/18 1655 07/15/18 0432 07/16/18 0429 07/17/18 1034 07/18/18 0950  NA 139   < > 139 138 139 142  --  140  K 2.5*   < > 2.5* 2.8*  3.1* 3.2* 3.0* 3.5  CL 98   < > 105 105 109 114*  --  110  CO2 27   < > 24 25 21* 22  --  22  GLUCOSE 163*   < > 168* 189* 184* 141*  --  163*  BUN 20   < > '14 11 9 '$ 6*  --  5*  CREATININE 1.21*   < > 0.95 0.96 0.94 0.85  --  0.74  CALCIUM 7.8*   < > 7.9* 8.2* 8.1* 8.3*  --  8.6*  MG 1.8  --  2.2  --   --   --   --  1.8  PHOS 3.3  --   --   --   --   --   --   --    < > = values in this interval not displayed.   Liver Function Tests: Recent Labs  Lab 07/12/18 0414  AST 25  ALT 27  ALKPHOS 79  BILITOT 0.5  PROT 6.0*  ALBUMIN 2.5*   No results for input(s): LIPASE, AMYLASE in the last 168 hours. No results for input(s): AMMONIA in the last 168 hours. CBC: Recent Labs  Lab 07/12/18 0414  07/14/18 0502 07/15/18 0432 07/16/18 0429 07/17/18 0340 07/18/18 0346  WBC 15.1*   < > 11.4* 10.8* 7.2 8.3 8.7  NEUTROABS 11.1*  --   --   --   --   --   --   HGB 10.9*   < > 9.5* 9.8* 9.6* 9.2* 9.6*  HCT 35.7*   < > 31.9* 32.9* 32.2* 31.2* 32.3*  MCV 90.6   < > 91.4 91.6 92.0 92.6 92.3  PLT 403*   < > 430* 423* 390 408* 379   < > = values in this interval not displayed.   Cardiac Enzymes: No results for input(s): CKTOTAL, CKMB, CKMBINDEX, TROPONINI in the last 168 hours. BNP: Invalid input(s): POCBNP CBG: Recent Labs  Lab 07/17/18 0739 07/17/18 1149 07/17/18 1702 07/17/18 2148 07/18/18 0748  GLUCAP 143* 154* 134* 139* 151*   D-Dimer No results for input(s): DDIMER in the last 72 hours. Hgb A1c No results for input(s): HGBA1C in the last  72 hours. Lipid Profile No results for input(s): CHOL, HDL, LDLCALC, TRIG, CHOLHDL, LDLDIRECT in the last 72 hours. Thyroid function studies No results for input(s): TSH, T4TOTAL, T3FREE, THYROIDAB in the last 72 hours.  Invalid input(s): FREET3 Anemia work up No results for input(s): VITAMINB12, FOLATE, FERRITIN, TIBC, IRON, RETICCTPCT in the last 72 hours. Urinalysis    Component Value Date/Time   COLORURINE YELLOW 07/07/2018 1448   APPEARANCEUR CLOUDY (A) 07/07/2018 1448   LABSPEC 1.012 07/07/2018 1448   PHURINE 5.0 07/07/2018 1448   GLUCOSEU NEGATIVE 07/07/2018 1448   HGBUR SMALL (A) 07/07/2018 1448   BILIRUBINUR NEGATIVE 07/07/2018 1448   KETONESUR NEGATIVE 07/07/2018 1448   PROTEINUR NEGATIVE 07/07/2018 1448   NITRITE NEGATIVE 07/07/2018 1448   LEUKOCYTESUR LARGE (A) 07/07/2018 1448   Sepsis Labs Invalid input(s): PROCALCITONIN,  WBC,  LACTICIDVEN Microbiology Recent Results (from the past 240 hour(s))  MRSA PCR Screening     Status: Abnormal   Collection Time: 07/09/18 11:43 AM  Result Value Ref Range Status   MRSA by PCR POSITIVE (A) NEGATIVE Final    Comment:        The GeneXpert MRSA Assay (FDA approved for NASAL specimens only), is one component of a comprehensive MRSA colonization surveillance program. It is not intended to diagnose MRSA  infection nor to guide or monitor treatment for MRSA infections. RESULT CALLED TO, READ BACK BY AND VERIFIED WITH: C.ELLIS AT 1544 ON 07/09/18 BY N.THOMPSON Performed at [facility], [place] [address] [place], [place] [address]   Culture, blood (routine x 2)     Status: None   Collection Time: 07/12/18  7:00 PM  Result Value Ref Range Status   Specimen Description BLOOD SITE NOT SPECIFIED  Final   Special Requests   Final    BOTTLES DRAWN AEROBIC AND ANAEROBIC Blood Culture adequate volume Performed at [facility] [address] [place], [place] [address]    Culture   Final     NO GROWTH 5 DAYS Performed at [facility], [place] [address] [place], [place] [address]    Report Status 07/17/2018 FINAL  Final  Culture, blood (routine x 2)     Status: None   Collection Time: 07/12/18  7:16 PM  Result Value Ref Range Status   Specimen Description   Final    BLOOD Performed at [facility] [address], [place], [place] [address]    Special Requests   Final    BOTTLES DRAWN AEROBIC ONLY Blood Culture adequate volume Performed at [facility] [address], [place], [place] [address]    Culture   Final    NO GROWTH 5 DAYS Performed at [facility], [place] [address] [place], [place] [address]    Report Status 07/17/2018 FINAL  Final  Aerobic/Anaerobic Culture (surgical/deep wound)     Status: None (Preliminary result)   Collection Time: 07/15/18  4:32 PM  Result Value Ref Range Status   Specimen Description   Final    WOUND DEEP WOUND 12 TO 13 DISC ASPIRATION Performed at [facility] [address], [place], [place] [address]    Special Requests   Final    NONE Performed at [facility], [place] [address], [place], [place] [address]    Gram Stain   Final    RARE WBC PRESENT, PREDOMINANTLY PMN NO ORGANISMS SEEN    Culture   Final    NO GROWTH 2 DAYS NO ANAEROBES ISOLATED; CULTURE IN PROGRESS FOR 5 DAYS Performed at [facility] [address], [place], [place] [address]    Report Status PENDING  Incomplete     Time coordinating discharge: 36  minutes  SIGNED:   Hosie Poisson, MD  [facility] 07/18/2018, 11:35 AM Pager   If 7PM-7AM, please contact night-coverage [email or web address] Password [email or web address]

## 2018-07-18 NOTE — Progress Notes (Signed)
PHARMACY CONSULT NOTE FOR:  OUTPATIENT  PARENTERAL ANTIBIOTIC THERAPY (OPAT)  Indication:  Discitis/osteomyelitis Regimen: meropenem 1gm IV q8h and vancomycin 1250 mg IV q24h End date: 08/20/2018  IV antibiotic discharge orders are pended. To discharging provider:  please sign these orders via discharge navigator,  Select New Orders & click on the button choice - Manage This Unsigned Work.     Thank you for allowing pharmacy to be a part of this patient's care.  Lynelle Doctor 07/18/2018, 10:29 AM

## 2018-07-18 NOTE — Progress Notes (Signed)
Pharmacy Antibiotic Note  Danielle Dennis is a 85 y.o. female with recent hx ESBL E. Coli bactremia and UTI presented to [facility] on 2/12 for suspected clogged foley and subsequently transferred to [facility] on 2/16 for workup of GIB.  GI team recommended to hold off on endoscopy since she was considered to be "high risk" for procedure and signed off on 2/19. She developed acute on chronic back pain with CT on 2/21 showed evidence of L2-3 discitis/osteomyelitis.  IR performed CT guided aspiration of L2-L3 on 2/24. ID team recommends to treat with vancomycin and meropenem for 6 weeks (last day 08/20/2018).  Today, 07/18/2018: - day #14 abx; D2 meropenem; D7 vancomycin - afeb, wbc wnl - scr 0.85 on 2/25   Plan: -  continue meropenem 1gm IV q8h - continue vancomycin 1250 mg IV q24h - plan is to discharge patient to SNF today (2/27). Recom. to check vancomycin level at SNF on 2/28 since inpatient dose is not due until 6p and patient will likely be discharged before then. - OPAT completed  ______________________________________  Height: 5\' 2"  (157.5 cm) Weight: 209 lb 7 oz (95 kg) IBW/kg (Calculated) : 50.1  Temp (24hrs), Avg:98.3 F (36.8 C), Min:98.3 F (36.8 C), Max:98.3 F (36.8 C)  Recent Labs  Lab 07/11/18 2130  07/12/18 0628 07/12/18 1242 07/12/18 1407 07/12/18 1703  07/13/18 1613 07/14/18 0502 07/14/18 1655 07/15/18 0432 07/16/18 0429 07/17/18 0340 07/18/18 0346  WBC  --    < >  --   --   --   --    < >  --  11.4*  --  10.8* 7.2 8.3 8.7  CREATININE  --    < >  --   --   --   --    < > 1.08* 0.95 0.96 0.94 0.85  --   --   LATICACIDVEN 3.9*  --  1.9 4.0* 4.4* 3.1*  --   --   --   --   --   --   --   --    < > = values in this interval not displayed.    Estimated Creatinine Clearance: 54.9 mL/min (by C-G formula based on SCr of 0.85 mg/dL).    Allergies  Allergen Reactions  . Codeine Other (See Comments)    Per MAR    Antimicrobials this admission:  2/16  Invanz>>2/20 2/20 Vanc >> 2/20 ZEI>> 2/26 2/21 Diflucan>>2/22 2/26 Meropenem >>  Dose adjustments this admission:  2/23: Inc Vanc 1250mg  Q24h for improved renal fxn- AUC (using AdjBW) ~550 with Cmin 14.  (prefer more aggressive until r/o discitis)  Microbiology results:  2/8 uxs: ESBL Ecoli and pan sens E faecium (from [facility]?)  2/16 BCx x2: NGTD 2/16 UCx: 30K yeast FINAL 2/18 MRSA PCR: pos 2/21 BCx x2: neg FINAL 2/24 Disc aspirate: ngtd  Thank you for allowing pharmacy to be a part of this patient's care.  Lynelle Doctor 07/18/2018 8:39 AM

## 2018-07-18 NOTE — Discharge Instructions (Signed)
Information on my medicine - ELIQUIS (apixaban)  This medication education was reviewed with me or my healthcare representative as part of my discharge preparation.  The pharmacist that spoke with me during my hospital stay was:  Josefine Class, Student-PharmD  Why was Eliquis prescribed for you? Eliquis was prescribed to treat blood clots that may have been found in the veins of your legs (deep vein thrombosis) or in your lungs (pulmonary embolism) and to reduce the risk of them occurring again.  What do You need to know about Eliquis ? The starting dose is 10 mg (two 5 mg tablets) taken TWICE daily for the FIRST SEVEN (7) DAYS, then on 07/25/18  the dose is reduced to ONE 5 mg tablet taken TWICE daily.  Eliquis may be taken with or without food.   Try to take the dose about the same time in the morning and in the evening. If you have difficulty swallowing the tablet whole please discuss with your pharmacist how to take the medication safely.  Take Eliquis exactly as prescribed and DO NOT stop taking Eliquis without talking to the doctor who prescribed the medication.  Stopping may increase your risk of developing a new blood clot.  Refill your prescription before you run out.  After discharge, you should have regular check-up appointments with your healthcare provider that is prescribing your Eliquis.    What do you do if you miss a dose? If a dose of ELIQUIS is not taken at the scheduled time, take it as soon as possible on the same day and twice-daily administration should be resumed. The dose should not be doubled to make up for a missed dose.  Important Safety Information A possible side effect of Eliquis is bleeding. You should call your healthcare provider right away if you experience any of the following: ? Bleeding from an injury or your nose that does not stop. ? Unusual colored urine (red or dark brown) or unusual colored stools (red or black). ? Unusual bruising for  unknown reasons. ? A serious fall or if you hit your head (even if there is no bleeding).  Some medicines may interact with Eliquis and might increase your risk of bleeding or clotting while on Eliquis. To help avoid this, consult your healthcare provider or pharmacist prior to using any new prescription or non-prescription medications, including herbals, vitamins, non-steroidal anti-inflammatory drugs (NSAIDs) and supplements.  This website has more information on Eliquis (apixaban): http://www.eliquis.com/eliquis/home

## 2018-07-18 NOTE — Progress Notes (Addendum)
ANTICOAGULATION CONSULT NOTE - Follow Up Consult  Pharmacy Consult for heparin Indication: acute pulmonary embolus  Allergies  Allergen Reactions  . Codeine Other (See Comments)    Per [facility]    Patient Measurements: Height: 5\' 2"  (157.5 cm) Weight: 209 lb 7 oz (95 kg) IBW/kg (Calculated) : 50.1 Heparin Dosing Weight: 72 kg  Vital Signs: Temp: 98.3 F (36.8 C) (02/27 0510) Temp Source: Oral (02/27 0510) BP: 132/52 (02/27 0510) Pulse Rate: 79 (02/27 0510)  Labs: Recent Labs    07/16/18 0429 07/16/18 1342 07/17/18 0340 07/18/18 0346  HGB 9.6*  --  9.2* 9.6*  HCT 32.2*  --  31.2* 32.3*  PLT 390  --  408* 379  HEPARINUNFRC 0.31 0.39 0.40 0.33  CREATININE 0.85  --   --   --     Estimated Creatinine Clearance: 54.9 mL/min (by C-G formula based on SCr of 0.85 mg/dL).   Assessment: Patient's an 85 y.o F presented to [facility] on 2/12 for suspected clogged foley and subsequently transferred to [facility] on 2/16 for workup of GIB.  GI team recommended to hold off on endoscopy since she was considered to be "high risk" for procedure and signed off on 2/19.  Chest CTA on 2/21 showed nonocclusive right PE.  Patient's currently on heparin drip for acute PE with plan to transition to Eliquis at discharge.  Significant events: - 2/24: needle guided aspiration for discitis/osteomyelitis  Today, 07/18/2018: - heparin level is therapeutic at 0.33 - cbc stable - no bleeding documented  Goal of Therapy:  Heparin level 0.3-0.7 units/ml Monitor platelets by anticoagulation protocol: Yes   Plan:  - continue heparin drip at 1450 units/hr - monitor for s/s bleeding  Lashan Gluth P 07/18/2018,8:26 AM   ___________________________________ Adden: To transition pt to Eliquis today (2/27) - stop heparin drip - start eliquis 10 mg bid for 7 days, then 5 mg bid   Dia Sitter, PharmD, BCPS 07/18/2018 9:33 AM

## 2018-07-18 NOTE — Care Management Important Message (Signed)
Important Message  Patient Details  Name: Elayna Tobler MRN: [id number] Date of Birth: 03/20/1934   Medicare Important Message Given:  Yes    Kerin Salen 07/18/2018, 12:01 [place] Message  Patient Details  Name: Samentha Perham MRN: [id number] Date of Birth: 1933/10/16   Medicare Important Message Given:  Yes    Kerin Salen 07/18/2018, 12:01 PM

## 2018-07-18 NOTE — Progress Notes (Signed)
Physical Therapy Treatment Patient Details Name: Danielle Dennis MRN: [id number] DOB: [birth year] Today's Date: 07/18/2018    History of Present Illness 85 year old Caucasian female from assisted living with past medical history of hypertension, type 2 diabetes mellitus, COPD, hyponatremia, hypothyroidism, dCHF who was adm  to ED on 07/03/2018  at [facility] d/t issues with foly catheter not draining & c/o  generalized weakness with difficulty amb, and blood in stool, transferred to [facility] on 2/16 d/t no GI service at [facility] not agreeable to mobilize however participated with exercises in supine with encouragement.  Pt likely to d/c to SNF today per RN.  Follow Up Recommendations  SNF     Equipment Recommendations  None recommended by PT    Recommendations for Other Services       Precautions / Restrictions Precautions Precautions: Fall    Mobility  Bed Mobility               General bed mobility comments: pt declined any mobility, only agreeable to exercises with encouragement  Transfers                    Ambulation/Gait                 Stairs             Wheelchair Mobility    Modified Rankin (Stroke Patients Only)       Balance                                            Cognition Arousal/Alertness: Awake/alert Behavior During Therapy: Flat affect;WFL for tasks assessed/performed Overall Cognitive Status: No family/caregiver present to determine baseline cognitive functioning                         Following Commands: Follows one step commands consistently              Exercises General Exercises - Lower Extremity Ankle Circles/Pumps: AROM;AAROM;10 reps;Both Quad Sets: AROM;10 reps;Both Short Arc Quad: AROM;10 reps;Both Heel Slides: AAROM;10 reps;Both    General Comments        Pertinent Vitals/Pain Pain Assessment: Faces Faces Pain Scale: Hurts little  more Pain Location: back Pain Descriptors / Indicators: Sore Pain Intervention(s): Monitored during session;Repositioned    Home Living                      Prior Function            PT Goals (current goals can now be found in the care plan section) Progress towards PT goals: Progressing toward goals    Frequency    Min 2X/week      PT Plan Current plan remains appropriate    Co-evaluation              AM-PAC PT "6 Clicks" Mobility   Outcome Measure  Help needed turning from your back to your side while in a flat bed without using bedrails?: Total Help needed moving from lying on your back to sitting on the side of a flat bed without using bedrails?: Total Help needed moving to and from a bed to a chair (including a wheelchair)?: Total Help needed standing up from a chair using your arms (e.g., wheelchair or bedside chair)?: Total  Help needed to walk in hospital room?: Total Help needed climbing 3-5 steps with a railing? : Total 6 Click Score: 6    End of Session   Activity Tolerance: Patient limited by fatigue;Patient limited by pain Patient left: in bed;with bed alarm set;with call bell/phone within reach   PT Visit Diagnosis: Muscle weakness (generalized) (M62.81)     Time: 5993-5701 PT Time Calculation (min) (ACUTE ONLY): 13 min  Charges:  $Therapeutic Exercise: 8-22 mins                     Carmelia Bake, PT, DPT Acute Rehabilitation Services Office: [phone] Pager: [phone]  Trena Platt 07/18/2018, 2:09 PM

## 2018-07-18 NOTE — Progress Notes (Signed)
Heparin stopped as ordered and Apixaban (Eliquis) given. SRP, RN

## 2018-07-19 DIAGNOSIS — I10 Essential (primary) hypertension: Secondary | ICD-10-CM | POA: Diagnosis not present

## 2018-07-19 DIAGNOSIS — M2550 Pain in unspecified joint: Secondary | ICD-10-CM | POA: Diagnosis not present

## 2018-07-19 DIAGNOSIS — Z7401 Bed confinement status: Secondary | ICD-10-CM | POA: Diagnosis not present

## 2018-07-19 DIAGNOSIS — I110 Hypertensive heart disease with heart failure: Secondary | ICD-10-CM | POA: Diagnosis not present

## 2018-07-19 DIAGNOSIS — G301 Alzheimer's disease with late onset: Secondary | ICD-10-CM | POA: Diagnosis not present

## 2018-07-19 DIAGNOSIS — M4646 Discitis, unspecified, lumbar region: Secondary | ICD-10-CM | POA: Diagnosis not present

## 2018-07-19 DIAGNOSIS — R262 Difficulty in walking, not elsewhere classified: Secondary | ICD-10-CM | POA: Diagnosis not present

## 2018-07-19 DIAGNOSIS — R293 Abnormal posture: Secondary | ICD-10-CM | POA: Diagnosis not present

## 2018-07-19 DIAGNOSIS — I2699 Other pulmonary embolism without acute cor pulmonale: Secondary | ICD-10-CM | POA: Diagnosis not present

## 2018-07-19 DIAGNOSIS — J9621 Acute and chronic respiratory failure with hypoxia: Secondary | ICD-10-CM | POA: Diagnosis not present

## 2018-07-19 DIAGNOSIS — E46 Unspecified protein-calorie malnutrition: Secondary | ICD-10-CM | POA: Diagnosis not present

## 2018-07-19 DIAGNOSIS — I2609 Other pulmonary embolism with acute cor pulmonale: Secondary | ICD-10-CM | POA: Diagnosis not present

## 2018-07-19 DIAGNOSIS — D649 Anemia, unspecified: Secondary | ICD-10-CM | POA: Diagnosis not present

## 2018-07-19 DIAGNOSIS — J431 Panlobular emphysema: Secondary | ICD-10-CM | POA: Diagnosis not present

## 2018-07-19 DIAGNOSIS — R0902 Hypoxemia: Secondary | ICD-10-CM | POA: Diagnosis not present

## 2018-07-19 DIAGNOSIS — M25571 Pain in right ankle and joints of right foot: Secondary | ICD-10-CM | POA: Diagnosis not present

## 2018-07-19 DIAGNOSIS — S92354D Nondisplaced fracture of fifth metatarsal bone, right foot, subsequent encounter for fracture with routine healing: Secondary | ICD-10-CM | POA: Diagnosis not present

## 2018-07-19 DIAGNOSIS — R41841 Cognitive communication deficit: Secondary | ICD-10-CM | POA: Diagnosis not present

## 2018-07-19 DIAGNOSIS — M8580 Other specified disorders of bone density and structure, unspecified site: Secondary | ICD-10-CM | POA: Diagnosis not present

## 2018-07-19 DIAGNOSIS — N132 Hydronephrosis with renal and ureteral calculous obstruction: Secondary | ICD-10-CM | POA: Diagnosis not present

## 2018-07-19 DIAGNOSIS — E119 Type 2 diabetes mellitus without complications: Secondary | ICD-10-CM | POA: Diagnosis not present

## 2018-07-19 DIAGNOSIS — M6281 Muscle weakness (generalized): Secondary | ICD-10-CM | POA: Diagnosis not present

## 2018-07-19 DIAGNOSIS — J441 Chronic obstructive pulmonary disease with (acute) exacerbation: Secondary | ICD-10-CM | POA: Diagnosis not present

## 2018-07-19 DIAGNOSIS — S92351A Displaced fracture of fifth metatarsal bone, right foot, initial encounter for closed fracture: Secondary | ICD-10-CM | POA: Diagnosis not present

## 2018-07-19 DIAGNOSIS — F0280 Dementia in other diseases classified elsewhere without behavioral disturbance: Secondary | ICD-10-CM | POA: Diagnosis not present

## 2018-07-19 DIAGNOSIS — M4626 Osteomyelitis of vertebra, lumbar region: Secondary | ICD-10-CM | POA: Diagnosis not present

## 2018-07-19 DIAGNOSIS — I5032 Chronic diastolic (congestive) heart failure: Secondary | ICD-10-CM | POA: Diagnosis not present

## 2018-07-19 DIAGNOSIS — R7989 Other specified abnormal findings of blood chemistry: Secondary | ICD-10-CM | POA: Diagnosis not present

## 2018-07-19 DIAGNOSIS — E039 Hypothyroidism, unspecified: Secondary | ICD-10-CM | POA: Diagnosis not present

## 2018-07-19 DIAGNOSIS — R1312 Dysphagia, oropharyngeal phase: Secondary | ICD-10-CM | POA: Diagnosis not present

## 2018-07-19 LAB — GLUCOSE, CAPILLARY
Glucose-Capillary: 139 mg/dL — ABNORMAL HIGH (ref 70–99)
Glucose-Capillary: 148 mg/dL — ABNORMAL HIGH (ref 70–99)

## 2018-07-19 LAB — CBC
HCT: 32.1 % — ABNORMAL LOW (ref 36.0–46.0)
Hemoglobin: 9.5 g/dL — ABNORMAL LOW (ref 12.0–15.0)
MCH: 27.6 pg (ref 26.0–34.0)
MCHC: 29.6 g/dL — ABNORMAL LOW (ref 30.0–36.0)
MCV: 93.3 fL (ref 80.0–100.0)
Platelets: 377 10*3/uL (ref 150–400)
RBC: 3.44 MIL/uL — AB (ref 3.87–5.11)
RDW: 16.4 % — ABNORMAL HIGH (ref 11.5–15.5)
WBC: 9 10*3/uL (ref 4.0–10.5)
nRBC: 0 % (ref 0.0–0.2)

## 2018-07-19 MED ORDER — HEPARIN SOD (PORK) LOCK FLUSH 100 UNIT/ML IV SOLN
250.0000 [IU] | INTRAVENOUS | Status: AC | PRN
  Administered 2018-07-19: 250 [IU]

## 2018-07-19 MED ORDER — VANCOMYCIN HCL 10 G IV SOLR
1500.0000 mg | INTRAVENOUS | Status: DC
  Administered 2018-07-19: 1500 mg via INTRAVENOUS
  Filled 2018-07-19: qty 1500

## 2018-07-19 MED ORDER — VANCOMYCIN IV (FOR PTA / DISCHARGE USE ONLY): 1500.0000 mg | INTRAVENOUS | 0 refills | Status: AC

## 2018-07-19 MED ORDER — LOPERAMIDE HCL 2 MG PO CAPS: 2.0000 mg | ORAL_CAPSULE | ORAL | 0 refills | Status: DC | PRN

## 2018-07-19 NOTE — Clinical Social Work Placement (Addendum)
D/C Summary sent Nurse call report to: [phone] Room 104 PTAR arranged for 4:00pm pick up.    CLINICAL SOCIAL WORK PLACEMENT  NOTE  Date:  07/19/2018  Patient Details  Name: Danielle Dennis MRN: [id number] Date of Birth: 09-Feb-1934  Clinical Social Work is seeking post-discharge placement for this patient at the [place] level of care (*CSW will initial, date and re-position this form in  chart as items are completed):  Yes   Patient/family provided with [facility] Work Department's list of facilities offering this level of care within the geographic area requested by the patient (or if unable, by the patient's family).  Yes   Patient/family informed of their freedom to choose among providers that offer the needed level of care, that participate in Medicare, Medicaid or managed care program needed by the patient, have an available bed and are willing to accept the patient.  Yes   Patient/family informed of [facility]'s ownership interest in [facility] and [facility], as well as of the fact that they are under no obligation to receive care at these facilities.  PASRR submitted to EDS on       PASRR number received on       Existing PASRR number confirmed on       FL2 transmitted to all facilities in geographic area requested by pt/family on       FL2 transmitted to all facilities within larger geographic area on 07/18/18     Patient informed that his/her managed care company has contracts with or will negotiate with certain facilities, including the following:            Patient/family informed of bed offers received.  Patient chooses bed at     [facility] recommends and patient chooses bed at      Patient to be transferred to   on 07/19/18.  Patient to be transferred to facility by     [facility]   Patient family notified on 07/19/18 of transfer.  Name of family member notified:  Daughter  Joy      PHYSICIAN Please prepare priority discharge summary, including medications     Additional Comment:    _______________________________________________ Lia Hopping, LCSW 07/19/2018, 10:57 AM

## 2018-07-19 NOTE — Progress Notes (Signed)
PHARMACY CONSULT NOTE --> UPDATED  OUTPATIENT  PARENTERAL ANTIBIOTIC THERAPY (OPAT)  ndication:  Discitis/osteomyelitis Regimen: meropenem 1gm IV q8h and vancomycin 1500 mg IV q24h End date: 08/20/2018  IV antibiotic discharge orders are pended. To discharging provider:  please sign these orders via discharge navigator,  Select New Orders & click on the button choice - Manage This Unsigned Work.     Thank you for allowing pharmacy to be a part of this patient's care.  Lynelle Doctor 07/19/2018, 7:07 AM

## 2018-07-19 NOTE — Discharge Summary (Signed)
Physician Discharge Summary  Danielle Dennis QQP:[id number] DOB: 1933/07/07 DOA: 07/07/2018  PCP: Emmaline Kluver, MD  Admit date: 07/07/2018 Discharge date: 07/19/2018  Admitted From: snf Disposition:  SNF  Recommendations for Outpatient Follow-up:  1. Follow up with PCP in 1-2 weeks 2. Please obtain BMP/CBC in one week  Discharge Condition:STABLE.  Diet recommendation: Heart Healthy   Brief/Interim Summary: 85 y.o.femalewith medical history significant ofHFpEF, COPD, T2DM, HTN, Hypothyroidism, ESBL e coli bacteremia and multiple other medical problems who presented to [place] hospital initially with concern for the "foley not draining right all day" on 07/03/18. Pt also noted generalized weakness and difficulty ambulating. Urine cx from 06/29/2018 showed ESBL E. Coli and pan sensitive E faecium. Pt follows with Dr. Emi Holes (urology, who had started pt on ceftin and then subsequently changed to augmentin). Initially plan was for observation and d/c to SNF on 2/13, but she had severe hypokalemia to 2.1 and acute kidney injury. On 2/14, plan for d/c to SNF, but pt had large bowel movement with blood. Pt was monitored another night and did not have any additional episdoes for blood in stool or black stool, but then started having episodes of coffee-ground low volume emesis. GI was consulted. No further signs of hematemesis and rectal bleeding probably from stercoral colitis. GI signed off on 07/10/2018.  Hospital course complicated by pulmonary embolism, clinically undetermined if chronic, and suspected discitis at L2-L3.  Disc aspirated by IR on 07/15/18. Not enough sample removed. ID following and rec c/w IV vanc and IV meropenem. IV team consulted for PICC line plaement.  Discharge Diagnoses:  Principal Problem:   GI bleed Active Problems:   UTI due to extended-spectrum beta lactamase (ESBL) producing Escherichia coli   (HFpEF) heart failure with preserved ejection fraction  (HCC)   T2DM (type 2 diabetes mellitus) (HCC)   Discitis of lumbar region  Resolved upper GI bleed Hemoglobin stable around 9 Continue with PPI twice daily. GI consulted and recommended no further plans for endoscopy at this time.   Right pulmonary embolism with mild right heart strain CTA positive for PE on 07/12/2018. Started on IV heparin and transition to Eliquis on discharge.    ACute on chronic respiratory failure with hypoxia secondary to pulmonary embolism Improving Nasal cannula oxygen to keep sats greater than 90%.   L2-L3 discitis/osteomyelitis IR consulted and she underwent aspiration on 07/15/2018.  And specimen sent for cultures. ID consulted and recommended IV vancomycin and meropenem.  PICC line placed. plan to complete 6 weeks of meropenem and vancomycin as per ID recommendations.     Candiduria in the setting of indwelling Foley catheter which was removed on 07/13/2018. Patient denies any pain at this time.   Bilateral healthcare associated pneumonia Completed a course of 5 days of broad-spectrum IV antibiotics.   Hypokalemia, hypomagnesemia Replaced.    Type 2 diabetes mellitus Better controlled. Resume home meds on discharge.  Hemoglobin A1c is 8.1 CBG (last 3)  Recent Labs    07/18/18 1640 07/18/18 2112 07/19/18 0724  GLUCAP 136* 132* 139*      AKI Resolved with hydration.    Acute encephalopathy possibly from infection Resolved   Hydronephrosis CT showed showed mild to moderate right hydroureteronephrosis terminating at the junction of middle and distal third right ureter. Recommend outpatient follow-up with urology.   Essential hypertension Blood pressure uncontrolled.   Chronic diastolic heart failure She appears to be compensated at this time.    Hypothyroidism Continue with Synthroid.  Discharge Instructions  Discharge  Instructions    Diet - low sodium heart healthy   Complete by:  As  directed    Discharge instructions   Complete by:  As directed    Please follow up with PCP in one week.   Home infusion instructions [facility] May follow [place] Dosing Protocol; May administer Cathflo as needed to maintain patency of vascular access device.; Flushing of vascular access device: per [facility] Protocol: 0.9% NaCl pre/post medica...   Complete by:  As directed    Instructions:  May follow [place] Dosing Protocol   Instructions:  May administer Cathflo as needed to maintain patency of vascular access device.   Instructions:  Flushing of vascular access device: per [facility] Protocol: 0.9% NaCl pre/post medication administration and prn patency; Heparin 100 u/ml, 67m for implanted ports and Heparin 10u/ml, 547mfor all other central venous catheters.   Instructions:  May follow [facility] Anaphylaxis Protocol for First Dose Administration in the home: 0.9% NaCl at 25-50 ml/hr to maintain IV access for protocol meds. Epinephrine 0.3 ml IV/IM PRN and Benadryl 25-50 IV/IM PRN s/s of anaphylaxis.   Instructions:  [facility] Coordinator (RN) to assist per patient IV care needs in the home PRN.     Allergies as of 07/19/2018      Reactions   Codeine Other (See Comments)   Per MAR      Medication List    STOP taking these medications   amLODipine 5 MG tablet Commonly known as:  NORVASC   bumetanide 2 MG tablet Commonly known as:  BUMEX   HYDROcodone-acetaminophen 5-325 MG tablet Commonly known as:  NORCO/VICODIN     TAKE these medications   albuterol (2.5 MG/3ML) 0.083% nebulizer solution Commonly known as:  PROVENTIL Take 3 mLs (2.5 mg total) by nebulization every 6 (six) hours as needed for wheezing or shortness of breath.   apixaban 5 MG Tabs tablet Commonly known as:  ELIQUIS Take 2 tablets (10 mg total) by mouth 2 (two) times daily for 7 days.   apixaban 5 MG Tabs tablet Commonly known as:  ELIQUIS Take 1 tablet (5 mg total) by mouth 2 (two) times  daily. Start taking on:  July 26, 2018   aspirin 81 MG chewable tablet Chew 81 mg by mouth daily.   BREO ELLIPTA 100-25 MCG/INH Aepb Generic drug:  fluticasone furoate-vilanterol Inhale 1 puff into the lungs daily.   CEROVITE SENIOR Tabs Take 1 tablet by mouth daily.   estradiol 1 MG tablet Commonly known as:  ESTRACE Take 1 mg by mouth daily.   feeding supplement (ENSURE ENLIVE) Liqd Take 237 mLs by mouth 2 (two) times daily between meals.   hydrALAZINE 25 MG tablet Commonly known as:  APRESOLINE Take 25 mg by mouth 3 (three) times daily.   insulin regular 100 units/mL injection Commonly known as:  NOVOLIN R,HUMULIN R Inject 0-10 Units into the skin 4 (four) times daily -  with meals and at bedtime. Per sliding scale: <150 = 0 units 150-200 = 2 units 201-249 = 4 units 250-300 = 6 units 301-349 = 8 units 350-400 = 10 units >400 = Notify MD   levothyroxine 125 MCG tablet Commonly known as:  SYNTHROID, LEVOTHROID Take 125 mcg by mouth daily before breakfast.   loperamide 2 MG capsule Commonly known as:  IMODIUM Take 1 capsule (2 mg total) by mouth as needed for diarrhea or loose stools.   magnesium hydroxide 400 MG/5ML suspension Commonly known as:  MILK  OF MAGNESIA Take 30 mLs by mouth daily as needed for mild constipation.   meropenem  IVPB Commonly known as:  MERREM Inject 1 g into the vein every 8 (eight) hours. Indication:  Discitis/osteomyelitis Last Day of Therapy:  08/20/2018 Labs - Once weekly:  CBC/D, BMP, CRP, ESR   metolazone 2.5 MG tablet Commonly known as:  ZAROXOLYN Take 2.5 mg by mouth 2 (two) times a week. On Tuesday and Friday   pantoprazole 40 MG tablet Commonly known as:  PROTONIX Take 40 mg by mouth daily.   polyvinyl alcohol 1.4 % ophthalmic solution Commonly known as:  LIQUIFILM TEARS Place 1 drop into both eyes at bedtime.   potassium chloride SA 20 MEQ tablet Commonly known as:  K-DUR,KLOR-CON Take 40 mEq by mouth 2 (two) times  daily.   sitaGLIPtin 50 MG tablet Commonly known as:  JANUVIA Take 50 mg by mouth daily.   vancomycin  IVPB Inject 1,500 mg into the vein daily. Indication:   Discitis/osteomyelitis Last Day of Therapy:  08/20/2018 Labs - weekly on Fridays: CBC/D, BMP, ESR, CRP, and vancomycin trough            Home Infusion Instuctions  (From admission, onward)         Start     Ordered   07/18/18 0000  Home infusion instructions [facility] May follow [place] Dosing Protocol; May administer Cathflo as needed to maintain patency of vascular access device.; Flushing of vascular access device: per [facility] Protocol: 0.9% NaCl pre/post medica...    Question Answer Comment  Instructions May follow [place] Dosing Protocol   Instructions May administer Cathflo as needed to maintain patency of vascular access device.   Instructions Flushing of vascular access device: per [facility] Protocol: 0.9% NaCl pre/post medication administration and prn patency; Heparin 100 u/ml, 17m for implanted ports and Heparin 10u/ml, 563mfor all other central venous catheters.   Instructions May follow [facility] Anaphylaxis Protocol for First Dose Administration in the home: 0.9% NaCl at 25-50 ml/hr to maintain IV access for protocol meds. Epinephrine 0.3 ml IV/IM PRN and Benadryl 25-50 IV/IM PRN s/s of anaphylaxis.   Instructions [facility] Infusion Coordinator (RN) to assist per patient IV care needs in the home PRN.      07/18/18 1135          Contact information for follow-up providers    Street, ChSharon MtMD. Schedule an appointment as soon as possible for a visit in 1 week(s).   Specialty:  Family Medicine Contact information: [phone]            Contact information for after-discharge care    [facility] .   Service:  Skilled Nursing Contact information: [id number] [phone]               Allergies  Allergen Reactions  . Codeine Other (See Comments)    Per MAR    Consultations: ID GI IR.   Procedures/Studies: Ct Abdomen Pelvis Wo Contrast  Result Date: 07/07/2018 CLINICAL DATA:  Brownish bowel movement with vomiting and cough a ground emesis. Leukocytosis to 16.1. Assess for gastrointestinal bleed. EXAM: CT ABDOMEN AND PELVIS WITHOUT CONTRAST TECHNIQUE: Multidetector CT imaging of the abdomen and pelvis was performed following the standard protocol without IV contrast. COMPARISON:  07/06/2010 FINDINGS: Lower chest: New pulmonary consolidations in both lower lobes with  air bronchograms consistent with interval development of bilateral pneumonia and atelectasis. Trace left effusion. Top-normal heart size with out pericardial effusion or thickening. Right atrial and ventricular leads are present. Hepatobiliary: The gallbladder is surgically absent. The unenhanced liver is unremarkable. Pancreas: Atrophic pancreas without mass or ductal dilatation. No inflammation. Spleen: Normal Adrenals/Urinary Tract: Normal bilateral adrenal glands. The left kidney is unremarkable. Moderate marked right-sided hydroureteronephrosis and hydroureter is again visualized extending to the pelvic brim where there is a transition in size and caliber of the ureter question stricture. No calculus is identified. No apparent intraluminal mass. Stomach is decompressed by Foley catheter. Stomach/Bowel: Large amount of retained stool is seen within the descending colon through rectosigmoid with transmural thickening suggestive fecal impaction and possible stercoral colitis. Small hiatal hernia. Contrast filled physiologic distention of the stomach. The duodenal sweep and ligament of Treitz are normal. No small bowel obstruction. The appendix is not confidently identified. Vascular/Lymphatic: Aortoiliac atherosclerosis. No aneurysm. No adenopathy. Scattered pelvic  phleboliths are noted. Reproductive: Status post hysterectomy. No adnexal masses. Other: No free air nor free fluid. No hernia. Musculoskeletal: Degenerative disc disease and scoliosis of the lumbar spine. No aggressive osseous lesions. IMPRESSION: 1. New bilateral lower lobe pulmonary consolidations with air bronchograms consistent with interval development of bilateral pneumonia and atelectasis. Trace left effusion. 2. Moderate to marked right-sided hydroureteronephrosis and hydroureter extending nor definite ureteral mass. No significant change from prior. Is identified. 3. Large amount of retained stool within the descending colon through rectosigmoid with slight transmural thickening suggestive of fecal impaction and possible stercoral colitis. 4. Aortoiliac atherosclerosis. Electronically Signed   By: Ashley Royalty M.D.   On: 07/07/2018 20:14   Ct Head Wo Contrast  Result Date: 07/12/2018 CLINICAL DATA:  Confusion and cephalopathy. History of hypertension and diabetes. EXAM: CT HEAD WITHOUT CONTRAST TECHNIQUE: Contiguous axial images were obtained from the base of the skull through the vertex without intravenous contrast. COMPARISON:  CT HEAD November 09, 2011 FINDINGS: BRAIN: No intraparenchymal hemorrhage, mass effect nor midline shift. No parenchymal brain volume loss for age. No hydrocephalus. Patchy supratentorial white matter hypodensities within normal range for patient's age, though non-specific are most compatible with chronic small vessel ischemic disease. No acute large vascular territory infarcts. No abnormal extra-axial fluid collections. Basal cisterns are patent. VASCULAR: Moderate calcific atherosclerosis of the carotid siphons. SKULL: No skull fracture. No significant scalp soft tissue swelling. SINUSES/ORBITS: RIGHT maxillary mucosal retention cyst. Mastoid air cells are well aerated.The included ocular globes and orbital contents are non-suspicious. Status post bilateral ocular lens implants.  OTHER: 2.1 cm chronic LEFT facial sebaceous cyst. IMPRESSION: Negative non-contrast CT HEAD for age. Electronically Signed   By: Elon Alas M.D.   On: 07/12/2018 16:53   Ct Guided Needle Placement  Result Date: 07/15/2018 INDICATION: 85 year old female with a history of possible discitis/osteomyelitis EXAM: CT-GUIDED NEEDLE PLACEMENT FOR ASPIRATION OF DISCITIS/OSTEOMYELITIS MEDICATIONS: The patient is currently admitted to the hospital and receiving intravenous antibiotics. The antibiotics were administered within an appropriate time frame prior to the initiation of the procedure. COMPARISON:  CT 07/12/2018 ANESTHESIA/SEDATION: Fentanyl 50 mcg IV; Versed 1.5 mg IV Moderate Sedation Time:  10 minutes The patient was continuously monitored during the procedure by the interventional radiology nurse under my direct supervision. COMPLICATIONS: None PROCEDURE: Informed written consent was obtained from the patient after a thorough discussion of the procedural risks, benefits and alternatives. All questions were addressed. Maximal Sterile Barrier Technique was utilized including caps, mask, sterile gowns, sterile gloves, sterile drape,  hand hygiene and skin antiseptic. A timeout was performed prior to the initiation of the procedure. Patient position prone on the CT gantry table. Scout CT of the lumbar region was performed for planning purposes. Using the prior imaging, the scout image, and the toe program, the L2-L3 level was identified. Note that the L2-L3 level, actually corresponds to an L3-L4 level as the patient has 6 lumbar type non rib-bearing vertebral bodies. The prior CT designated the level and L2-L3 with an aero, and this was the level that was targeted. The patient is prepped and draped in the usual sterile fashion. 1% lidocaine was used for local anesthesia. Using CT guidance, 18 gauge trocar needle was advanced to the disc space. Attempt at aspiration was performed with no native fluid withdrawn.  1 cc-2 cc of sterile saline were then infused through the needle, with subsequent aspirate of small volume of fluid. This small volume was sent for culture. Sterile bandage was placed. Patient tolerated the procedure well and remained hemodynamically stable throughout. No complications were encountered and no significant blood loss. IMPRESSION: Status post CT-guided disc aspiration of L2-L3 (using the numbering system on the most recent comparison CT). Small volume of sterile wash was sent for culture, as no native fluid could be aspirated. Signed, Dulcy Fanny. Dellia Nims, RPVI [facility] Electronically Signed   By: Corrie Mckusick D.O.   On: 07/15/2018 17:28   Dg Chest Port 1 View  Result Date: 07/11/2018 CLINICAL DATA:  Hypoxia and cough. EXAM: PORTABLE CHEST 1 VIEW COMPARISON:  06/10/2018 FINDINGS: Left-sided pacemaker unchanged. Lungs are hypoinflated demonstrate worsening airspace opacification over the right perihilar region/medial right base. Mild prominence of the left infrahilar markings. No effusion. Cardiomediastinal silhouette and remainder of the exam is unchanged. IMPRESSION: Worsening opacification over the right infrahilar region/medial right base as well as prominence of the left infrahilar markings. Findings may be due to mild interstitial edema versus infection. Electronically Signed   By: Marin Olp M.D.   On: 07/11/2018 15:45   Ct Angio Chest/abd/pel For Dissection W And/or W/wo  Result Date: 07/12/2018 CLINICAL DATA:  Abdominal pain. EXAM: CT ANGIOGRAPHY CHEST, ABDOMEN AND PELVIS TECHNIQUE: Multidetector CT imaging through the chest, abdomen and pelvis was performed using the standard protocol during bolus administration of intravenous contrast. Multiplanar reconstructed images and MIPs were obtained and reviewed to evaluate the vascular anatomy. CONTRAST:  68m ISOVUE-370 IOPAMIDOL (ISOVUE-370) INJECTION 76% COMPARISON:   Abdominal CT 07/07/2018 FINDINGS: CTA CHEST FINDINGS Cardiovascular: There is a nonocclusive right pulmonary embolus within the lobar branch of the right upper lobe, extending to the segmental branches. There is a possible smaller nonocclusive pulmonary embolus in a basilar segmental branch in the left lower lobe. There is mild right heart strain with RV/LV ratio of 1.1. Mild atherosclerotic disease of the aorta with no evidence of dissection or aneurysmal dilation. Mediastinum/Nodes: No enlarged mediastinal, hilar, or axillary lymph nodes. Thyroid gland, trachea, and esophagus demonstrate no significant findings. Small hiatal hernia. Lungs/Pleura: Bilateral lower lobe airspace consolidation versus atelectasis. Musculoskeletal: No chest wall abnormality. No acute or significant osseous findings. Review of the MIP images confirms the above findings. CTA ABDOMEN AND PELVIS FINDINGS VASCULAR Aorta: Normal caliber aorta without aneurysm, dissection, vasculitis or significant stenosis. Celiac: Patent without evidence of aneurysm, dissection, vasculitis or significant stenosis. SMA: Patent without evidence of aneurysm, dissection, vasculitis or significant stenosis. Renals: Both renal arteries are patent without evidence of aneurysm, dissection, vasculitis, fibromuscular dysplasia or significant stenosis. IMA:  Patent without evidence of aneurysm, dissection, vasculitis or significant stenosis. Inflow: Patent without evidence of aneurysm, dissection, vasculitis or significant stenosis. Veins: No obvious venous abnormality within the limitations of this arterial phase study. Review of the MIP images confirms the above findings. NON-VASCULAR Hepatobiliary: Hepatic steatosis. Post cholecystectomy. Pancreas: Unremarkable. No pancreatic ductal dilatation or surrounding inflammatory changes. Spleen: Normal in size without focal abnormality. Adrenals/Urinary Tract: Normal adrenal glands. Normal appearance of the left kidney/left  ureter and urinary bladder with Foley catheter in place. There is a right hydronephrosis and hydroureter to the level of the distal third of the right ureter. No obstructive calculus or obvious ureteral mass. Stomach/Bowel: Stomach is within normal limits. No evidence of appendicitis. No evidence of bowel wall thickening, distention, or inflammatory changes. Relatively fluid contents of the colon, may be associated with a diarrheal state. Nonspecific gaseous distension of the colon. Scattered colonic diverticula. Lymphatic: Aortic atherosclerosis. No enlarged abdominal or pelvic lymph nodes. Reproductive: Status post hysterectomy. No adnexal masses. Other: No abdominal wall hernia or abnormality. No abdominopelvic ascites. Musculoskeletal: Scoliosis of the lumbosacral spine. Multilevel osteoarthritic changes. Sclerosis and erosive endplate changes at H9-Q2 centered around the disc space. These changes are out of proportion to the degenerative disc disease seen at the remaining levels of the spine. Review of the MIP images confirms the above findings. IMPRESSION: 1. Nonocclusive right pulmonary embolus within the lobar branch of the right upper lobe, extending to the segmental branches. Possible smaller nonocclusive pulmonary embolus in a basilar segmental branch of the left lower lobe. 2. Mild right heart strain with RV/LV ratio of 1. 3. Bilateral lower lobe airspace consolidation versus atelectasis. 4. Hepatic steatosis. 5. Right hydronephrosis and hydroureter to the level of the distal third of the right ureter. No obstructive calculus or obvious ureteral mass. 6. Sclerosis and erosive endplate changes at I2-L7 centered around the disc space. These changes are out of proportion to the osteoarthritic changes at the remaining levels of the spine. Findings may represent degenerative discogenic changes, however discitis/osteomyelitis may have a similar appearance. 7. Nonspecific gaseous distension of the colon and  probable diarrheal state. These results were called by telephone at the time of interpretation on 07/12/2018 at 5:19 pm to Dr. Irene Pap , who verbally acknowledged these results. Electronically Signed   By: Fidela Salisbury M.D.   On: 07/12/2018 17:20   [place] Ekg Site Rite  Result Date: 07/16/2018 If [facility] image not attached, placement could not be confirmed due to current cardiac rhythm.     Subjective:  No new complaints.  Discharge Exam: Vitals:   07/19/18 0405 07/19/18 0826  BP: (!) 115/95   Pulse: 80   Resp: 17   Temp: 97.6 F (36.4 C)   SpO2: 94% 95%   Vitals:   07/18/18 2038 07/18/18 2117 07/19/18 0405 07/19/18 0826  BP:  (!) 142/53 (!) 115/95   Pulse:  74 80   Resp:  20 17   Temp:  97.7 F (36.5 C) 97.6 F (36.4 C)   TempSrc:  Oral Oral   SpO2: 98% 96% 94% 95%  Weight:   95.2 kg   Height:        General: Pt is alert, awake, not in acute distress Cardiovascular: RRR, S1/S2 +, no rubs, no gallops Respiratory: CTA bilaterally, no wheezing, no rhonchi Abdominal: Soft, NT, ND, bowel sounds + Extremities: no edema, no cyanosis    The results of significant diagnostics from this hospitalization (including imaging, microbiology, ancillary and laboratory) are  listed below for reference.     Microbiology: Recent Results (from the past 240 hour(s))  MRSA PCR Screening     Status: Abnormal   Collection Time: 07/09/18 11:43 AM  Result Value Ref Range Status   MRSA by PCR POSITIVE (A) NEGATIVE Final    Comment:        The GeneXpert MRSA Assay (FDA approved for NASAL specimens only), is one component of a comprehensive MRSA colonization surveillance program. It is not intended to diagnose MRSA infection nor to guide or monitor treatment for MRSA infections. RESULT CALLED TO, READ BACK BY AND VERIFIED WITH: C.ELLIS AT 1544 ON 07/09/18 BY N.THOMPSON Performed at [facility], [place] [address] [place], [place] [address]   Culture, blood  (routine x 2)     Status: None   Collection Time: 07/12/18  7:00 PM  Result Value Ref Range Status   Specimen Description BLOOD SITE NOT SPECIFIED  Final   Special Requests   Final    BOTTLES DRAWN AEROBIC AND ANAEROBIC Blood Culture adequate volume Performed at [place] [address], [place], [place] [address]    Culture   Final    NO GROWTH 5 DAYS Performed at [facility], [place] [address], [place], [place] [address]    Report Status 07/17/2018 FINAL  Final  Culture, blood (routine x 2)     Status: None   Collection Time: 07/12/18  7:16 PM  Result Value Ref Range Status   Specimen Description   Final    BLOOD Performed at [place] [address] [place], [place] [address]    Special Requests   Final    BOTTLES DRAWN AEROBIC ONLY Blood Culture adequate volume Performed at [place] [address], [place], [place] [address]    Culture   Final    NO GROWTH 5 DAYS Performed at [facility], [place] [facility] [address], [place], [place] [address]    Report Status 07/17/2018 FINAL  Final  Aerobic/Anaerobic Culture (surgical/deep wound)     Status: None (Preliminary result)   Collection Time: 07/15/18  4:32 PM  Result Value Ref Range Status   Specimen Description   Final    WOUND DEEP WOUND 12 TO 13 DISC ASPIRATION Performed at [facility] [address], [place], [place] [address]    Special Requests   Final    NONE Performed at [facility], [place] [address], [place], [place] [address]    Gram Stain   Final    RARE WBC PRESENT, PREDOMINANTLY PMN NO ORGANISMS SEEN    Culture   Final    NO GROWTH 3 DAYS NO ANAEROBES ISOLATED; CULTURE IN PROGRESS FOR 5 DAYS Performed at [facility] [address] [place], [place] [address]    Report Status PENDING  Incomplete     Labs: BNP (last 3 results) No results for input(s): BNP in the last 8760 hours. Basic  Metabolic Panel: Recent Labs  Lab 07/14/18 0502 07/14/18 1655 07/15/18 0432 07/16/18 0429 07/17/18 1034 07/18/18 0950  NA 139 138 139 142  --  140  K 2.5* 2.8* 3.1* 3.2* 3.0* 3.5  CL 105 105 109 114*  --  110  CO2 24 25 21* 22  --  22  GLUCOSE 168* 189* 184* 141*  --  163*  BUN '14 11 9 '$ 6*  --  5*  CREATININE 0.95 0.96 0.94 0.85  --  0.74  CALCIUM 7.9* 8.2* 8.1* 8.3*  --  8.6*  MG 2.2  --   --   --   --  1.8   Liver Function Tests: No results for input(s): AST, ALT, ALKPHOS, BILITOT, PROT, ALBUMIN in the last 168 hours. No results for input(s): LIPASE, AMYLASE in the last 168 hours. No results for input(s): AMMONIA in the last 168 hours. CBC: Recent Labs  Lab 07/15/18 0432 07/16/18 0429 07/17/18 0340 07/18/18 0346 07/19/18 0317  WBC 10.8* 7.2 8.3 8.7 9.0  HGB 9.8* 9.6* 9.2* 9.6* 9.5*  HCT 32.9* 32.2* 31.2* 32.3* 32.1*  MCV 91.6 92.0 92.6 92.3 93.3  PLT 423* 390 408* 379 377   Cardiac Enzymes: No results for input(s): CKTOTAL, CKMB, CKMBINDEX, TROPONINI in the last 168 hours. BNP: Invalid input(s): POCBNP CBG: Recent Labs  Lab 07/18/18 0748 07/18/18 1155 07/18/18 1640 07/18/18 2112 07/19/18 0724  GLUCAP 151* 134* 136* 132* 139*   D-Dimer No results for input(s): DDIMER in the last 72 hours. Hgb A1c No results for input(s): HGBA1C in the last 72 hours. Lipid Profile No results for input(s): CHOL, HDL, LDLCALC, TRIG, CHOLHDL, LDLDIRECT in the last 72 hours. Thyroid function studies No results for input(s): TSH, T4TOTAL, T3FREE, THYROIDAB in the last 72 hours.  Invalid input(s): FREET3 Anemia work up No results for input(s): VITAMINB12, FOLATE, FERRITIN, TIBC, IRON, RETICCTPCT in the last 72 hours. Urinalysis    Component Value Date/Time   COLORURINE YELLOW 07/07/2018 1448   APPEARANCEUR CLOUDY (A) 07/07/2018 1448   LABSPEC 1.012 07/07/2018 1448   PHURINE 5.0 07/07/2018 1448   GLUCOSEU NEGATIVE 07/07/2018 1448   HGBUR SMALL (A) 07/07/2018 1448    BILIRUBINUR NEGATIVE 07/07/2018 1448   KETONESUR NEGATIVE 07/07/2018 1448   PROTEINUR NEGATIVE 07/07/2018 1448   NITRITE NEGATIVE 07/07/2018 1448   LEUKOCYTESUR LARGE (A) 07/07/2018 1448   Sepsis Labs Invalid input(s): PROCALCITONIN,  WBC,  LACTICIDVEN Microbiology Recent Results (from the past 240 hour(s))  MRSA PCR Screening     Status: Abnormal   Collection Time: 07/09/18 11:43 AM  Result Value Ref Range Status   MRSA by PCR POSITIVE (A) NEGATIVE Final    Comment:        The GeneXpert MRSA Assay (FDA approved for NASAL specimens only), is one component of a comprehensive MRSA colonization surveillance program. It is not intended to diagnose MRSA infection nor to guide or monitor treatment for MRSA infections. RESULT CALLED TO, READ BACK BY AND VERIFIED WITH: C.ELLIS AT 1544 ON 07/09/18 BY N.THOMPSON Performed at [facility], [place] [address], [place], [place] [address]   Culture, blood (routine x 2)     Status: None   Collection Time: 07/12/18  7:00 PM  Result Value Ref Range Status   Specimen Description BLOOD SITE NOT SPECIFIED  Final   Special Requests   Final    BOTTLES DRAWN AEROBIC AND ANAEROBIC Blood Culture adequate volume Performed at [facility] [address] [place], [place] [address]    Culture   Final    NO GROWTH 5 DAYS Performed at [facility], [place] [address] [place], [place] [address]    Report Status 07/17/2018 FINAL  Final  Culture, blood (routine x 2)     Status: None   Collection Time: 07/12/18  7:16 PM  Result Value Ref Range Status   Specimen Description   Final    BLOOD Performed at [facility] [address] [place], [place]  [address]    Special Requests   Final    BOTTLES DRAWN AEROBIC ONLY Blood Culture adequate volume Performed at [facility] [address], [place], [place] [address]    Culture   Final    NO GROWTH 5 DAYS Performed at [facility], [place] [address], [place], [place] [address]    Report Status 07/17/2018 FINAL  Final  Aerobic/Anaerobic Culture (surgical/deep wound)     Status: None (Preliminary result)   Collection Time: 07/15/18  4:32 PM  Result Value Ref Range Status   Specimen Description   Final    WOUND DEEP WOUND 12 TO 13 DISC ASPIRATION Performed at [facility] [address], [place], [place] [address]    Special Requests   Final    NONE Performed at [facility], [place] [address] [place], [place] [address]    Gram Stain   Final    RARE WBC PRESENT, PREDOMINANTLY PMN NO ORGANISMS SEEN    Culture   Final    NO GROWTH 3 DAYS NO ANAEROBES ISOLATED; CULTURE IN PROGRESS FOR 5 DAYS Performed at [facility] [address], [place], [place] [address]    Report Status PENDING  Incomplete     Time coordinating discharge: 36  minutes  SIGNED:   Hosie Poisson, MD  [facility] 07/19/2018, 10:44 AM Pager   If 7PM-7AM, please contact night-coverage [email or web address] Password [email or web address]

## 2018-07-19 NOTE — Progress Notes (Signed)
Called pt dtg, Creola Corn to update that EMS is on the way to pick up pt for transport. SRP, RN

## 2018-07-19 NOTE — Progress Notes (Signed)
Called report to Tiffany, LPN at [facility]...[phone]. SRP, RN

## 2018-07-19 NOTE — Progress Notes (Signed)
Pharmacy Antibiotic Note  Danielle Dennis is a 85 y.o. female admitted on 07/07/2018 with Discitis/osteomyelitis.  Pharmacy has been consulted for Vancomycin dosing.  Plan: Change vancomycin to 1500mg  iv q24hr--while the AUC is slightly above goal, the Cmin is closer to ID desired goal of 15-20.   Height: 5\' 2"  (157.5 cm) Weight: 209 lb 14.1 oz (95.2 kg) IBW/kg (Calculated) : 50.1  Temp (24hrs), Avg:97.9 F (36.6 C), Min:97.6 F (36.4 C), Max:98.4 F (36.9 C)  Recent Labs  Lab 07/12/18 1242 07/12/18 1407 07/12/18 1703  07/14/18 0502 07/14/18 1655 07/15/18 0432 07/16/18 0429 07/17/18 0340 07/18/18 0346 07/18/18 0950 07/18/18 1934 07/18/18 2301 07/19/18 0317  WBC  --   --   --    < > 11.4*  --  10.8* 7.2 8.3 8.7  --   --   --  9.0  CREATININE  --   --   --    < > 0.95 0.96 0.94 0.85  --   --  0.74  --   --   --   LATICACIDVEN 4.0* 4.4* 3.1*  --   --   --   --   --   --   --   --   --   --   --   VANCOTROUGH  --   --   --   --   --   --   --   --   --   --   --  11*  --   --   VANCOPEAK  --   --   --   --   --   --   --   --   --   --   --   --  32  --    < > = values in this interval not displayed.    Estimated Creatinine Clearance: 58.3 mL/min (by C-G formula based on SCr of 0.74 mg/dL).    Allergies  Allergen Reactions  . Codeine Other (See Comments)    Per MAR    Antimicrobials this admission: 2/16 Invanz>>2/20 2/20 Vanc >> 2/20 ZEI>> 2/26 2/21 Diflucan>>2/22 2/26 Meropenem >>  Dose adjustments this admission: 2/23: Inc Vanc 1250mg  Q24h for improved renal fxn- AUC (using AdjBW) ~550 with Cmin 14.  (prefer more aggressive until r/o discitis) 2/27: 1934 VT = 11, 2300 VP = 32   Microbiology results: 2/8 uxs: ESBL Ecoli and pan sens E faecium (from [facility]?)  2/16 BCx x2: NGTD 2/16 UCx: 30K yeast FINAL 2/18 MRSA PCR: pos 2/21 BCx x2: neg FINAL 2/24 Disc aspirate: ngtd  Thank you for allowing pharmacy to be a part of this patient's care.  Nani Skillern Crowford 07/19/2018 7:08 AM

## 2018-07-20 LAB — AEROBIC/ANAEROBIC CULTURE W GRAM STAIN (SURGICAL/DEEP WOUND): Culture: NO GROWTH

## 2018-07-21 DIAGNOSIS — G301 Alzheimer's disease with late onset: Secondary | ICD-10-CM | POA: Diagnosis not present

## 2018-07-21 DIAGNOSIS — E119 Type 2 diabetes mellitus without complications: Secondary | ICD-10-CM | POA: Diagnosis not present

## 2018-07-21 DIAGNOSIS — E039 Hypothyroidism, unspecified: Secondary | ICD-10-CM | POA: Diagnosis not present

## 2018-07-21 DIAGNOSIS — I10 Essential (primary) hypertension: Secondary | ICD-10-CM | POA: Diagnosis not present

## 2018-07-22 DIAGNOSIS — I10 Essential (primary) hypertension: Secondary | ICD-10-CM | POA: Diagnosis not present

## 2018-07-22 DIAGNOSIS — I5032 Chronic diastolic (congestive) heart failure: Secondary | ICD-10-CM | POA: Diagnosis not present

## 2018-07-22 DIAGNOSIS — M4646 Discitis, unspecified, lumbar region: Secondary | ICD-10-CM | POA: Diagnosis not present

## 2018-07-22 DIAGNOSIS — I2699 Other pulmonary embolism without acute cor pulmonale: Secondary | ICD-10-CM | POA: Diagnosis not present

## 2018-08-05 DIAGNOSIS — I5032 Chronic diastolic (congestive) heart failure: Secondary | ICD-10-CM | POA: Diagnosis not present

## 2018-08-05 DIAGNOSIS — M4646 Discitis, unspecified, lumbar region: Secondary | ICD-10-CM | POA: Diagnosis not present

## 2018-08-05 DIAGNOSIS — E119 Type 2 diabetes mellitus without complications: Secondary | ICD-10-CM | POA: Diagnosis not present

## 2018-08-05 DIAGNOSIS — I2699 Other pulmonary embolism without acute cor pulmonale: Secondary | ICD-10-CM | POA: Diagnosis not present

## 2018-08-10 DIAGNOSIS — I5032 Chronic diastolic (congestive) heart failure: Secondary | ICD-10-CM | POA: Diagnosis not present

## 2018-08-10 DIAGNOSIS — I2699 Other pulmonary embolism without acute cor pulmonale: Secondary | ICD-10-CM | POA: Diagnosis not present

## 2018-08-10 DIAGNOSIS — E039 Hypothyroidism, unspecified: Secondary | ICD-10-CM | POA: Diagnosis not present

## 2018-08-10 DIAGNOSIS — I10 Essential (primary) hypertension: Secondary | ICD-10-CM | POA: Diagnosis not present

## 2018-08-12 DIAGNOSIS — I5032 Chronic diastolic (congestive) heart failure: Secondary | ICD-10-CM | POA: Diagnosis not present

## 2018-08-12 DIAGNOSIS — I10 Essential (primary) hypertension: Secondary | ICD-10-CM | POA: Diagnosis not present

## 2018-08-12 DIAGNOSIS — M4646 Discitis, unspecified, lumbar region: Secondary | ICD-10-CM | POA: Diagnosis not present

## 2018-08-12 DIAGNOSIS — I2699 Other pulmonary embolism without acute cor pulmonale: Secondary | ICD-10-CM | POA: Diagnosis not present

## 2018-08-19 DIAGNOSIS — M4646 Discitis, unspecified, lumbar region: Secondary | ICD-10-CM | POA: Diagnosis not present

## 2018-08-19 DIAGNOSIS — I2699 Other pulmonary embolism without acute cor pulmonale: Secondary | ICD-10-CM | POA: Diagnosis not present

## 2018-08-19 DIAGNOSIS — I5032 Chronic diastolic (congestive) heart failure: Secondary | ICD-10-CM | POA: Diagnosis not present

## 2018-08-19 DIAGNOSIS — I10 Essential (primary) hypertension: Secondary | ICD-10-CM | POA: Diagnosis not present

## 2018-08-21 ENCOUNTER — Telehealth: Payer: Self-pay | Admitting: *Deleted

## 2018-08-21 NOTE — Telephone Encounter (Signed)
I guess we will find out tomorrow.

## 2018-08-21 NOTE — Telephone Encounter (Signed)
Per Dr Megan Salon called the patient to offer evisit and the number listed in the chart is an assisted living she is no longer a patient there. Called her daughter Caryl Asp number and had to leave a message to call the office.

## 2018-08-22 ENCOUNTER — Inpatient Hospital Stay: Payer: Medicare Other | Admitting: Internal Medicine

## 2018-08-22 DIAGNOSIS — S92351A Displaced fracture of fifth metatarsal bone, right foot, initial encounter for closed fracture: Secondary | ICD-10-CM | POA: Diagnosis not present

## 2018-08-31 DIAGNOSIS — I10 Essential (primary) hypertension: Secondary | ICD-10-CM | POA: Diagnosis not present

## 2018-08-31 DIAGNOSIS — I5032 Chronic diastolic (congestive) heart failure: Secondary | ICD-10-CM | POA: Diagnosis not present

## 2018-08-31 DIAGNOSIS — E039 Hypothyroidism, unspecified: Secondary | ICD-10-CM | POA: Diagnosis not present

## 2018-08-31 DIAGNOSIS — E46 Unspecified protein-calorie malnutrition: Secondary | ICD-10-CM | POA: Diagnosis not present

## 2018-09-16 DIAGNOSIS — I110 Hypertensive heart disease with heart failure: Secondary | ICD-10-CM | POA: Diagnosis not present

## 2018-09-16 DIAGNOSIS — J431 Panlobular emphysema: Secondary | ICD-10-CM | POA: Diagnosis not present

## 2018-09-16 DIAGNOSIS — I5032 Chronic diastolic (congestive) heart failure: Secondary | ICD-10-CM | POA: Diagnosis not present

## 2018-09-16 DIAGNOSIS — E119 Type 2 diabetes mellitus without complications: Secondary | ICD-10-CM | POA: Diagnosis not present

## 2018-09-19 DIAGNOSIS — S92351A Displaced fracture of fifth metatarsal bone, right foot, initial encounter for closed fracture: Secondary | ICD-10-CM | POA: Diagnosis not present

## 2018-10-01 DIAGNOSIS — E039 Hypothyroidism, unspecified: Secondary | ICD-10-CM | POA: Diagnosis not present

## 2018-10-01 DIAGNOSIS — I10 Essential (primary) hypertension: Secondary | ICD-10-CM | POA: Diagnosis not present

## 2018-10-01 DIAGNOSIS — E46 Unspecified protein-calorie malnutrition: Secondary | ICD-10-CM | POA: Diagnosis not present

## 2018-10-01 DIAGNOSIS — I5032 Chronic diastolic (congestive) heart failure: Secondary | ICD-10-CM | POA: Diagnosis not present

## 2018-10-17 DIAGNOSIS — E039 Hypothyroidism, unspecified: Secondary | ICD-10-CM | POA: Diagnosis not present

## 2018-10-17 DIAGNOSIS — I110 Hypertensive heart disease with heart failure: Secondary | ICD-10-CM | POA: Diagnosis not present

## 2018-10-17 DIAGNOSIS — S92354D Nondisplaced fracture of fifth metatarsal bone, right foot, subsequent encounter for fracture with routine healing: Secondary | ICD-10-CM | POA: Diagnosis not present

## 2018-10-17 DIAGNOSIS — I5032 Chronic diastolic (congestive) heart failure: Secondary | ICD-10-CM | POA: Diagnosis not present

## 2018-10-19 DIAGNOSIS — F0280 Dementia in other diseases classified elsewhere without behavioral disturbance: Secondary | ICD-10-CM | POA: Diagnosis not present

## 2018-10-19 DIAGNOSIS — G301 Alzheimer's disease with late onset: Secondary | ICD-10-CM | POA: Diagnosis not present

## 2018-10-19 DIAGNOSIS — I10 Essential (primary) hypertension: Secondary | ICD-10-CM | POA: Diagnosis not present

## 2018-10-19 DIAGNOSIS — E119 Type 2 diabetes mellitus without complications: Secondary | ICD-10-CM | POA: Diagnosis not present

## 2018-10-26 DIAGNOSIS — I5032 Chronic diastolic (congestive) heart failure: Secondary | ICD-10-CM | POA: Diagnosis not present

## 2018-10-26 DIAGNOSIS — E46 Unspecified protein-calorie malnutrition: Secondary | ICD-10-CM | POA: Diagnosis not present

## 2018-10-26 DIAGNOSIS — E039 Hypothyroidism, unspecified: Secondary | ICD-10-CM | POA: Diagnosis not present

## 2018-10-26 DIAGNOSIS — I10 Essential (primary) hypertension: Secondary | ICD-10-CM | POA: Diagnosis not present

## 2018-11-14 DIAGNOSIS — M6281 Muscle weakness (generalized): Secondary | ICD-10-CM | POA: Diagnosis not present

## 2018-11-14 DIAGNOSIS — J431 Panlobular emphysema: Secondary | ICD-10-CM | POA: Diagnosis not present

## 2018-11-14 DIAGNOSIS — S92351A Displaced fracture of fifth metatarsal bone, right foot, initial encounter for closed fracture: Secondary | ICD-10-CM | POA: Diagnosis not present

## 2018-11-14 DIAGNOSIS — I5032 Chronic diastolic (congestive) heart failure: Secondary | ICD-10-CM | POA: Diagnosis not present

## 2018-11-14 DIAGNOSIS — I110 Hypertensive heart disease with heart failure: Secondary | ICD-10-CM | POA: Diagnosis not present

## 2018-11-14 DIAGNOSIS — E119 Type 2 diabetes mellitus without complications: Secondary | ICD-10-CM | POA: Diagnosis not present

## 2018-11-16 DIAGNOSIS — F0280 Dementia in other diseases classified elsewhere without behavioral disturbance: Secondary | ICD-10-CM | POA: Diagnosis not present

## 2018-11-16 DIAGNOSIS — E119 Type 2 diabetes mellitus without complications: Secondary | ICD-10-CM | POA: Diagnosis not present

## 2018-11-16 DIAGNOSIS — I10 Essential (primary) hypertension: Secondary | ICD-10-CM | POA: Diagnosis not present

## 2018-11-16 DIAGNOSIS — G301 Alzheimer's disease with late onset: Secondary | ICD-10-CM | POA: Diagnosis not present

## 2018-11-22 DIAGNOSIS — R079 Chest pain, unspecified: Secondary | ICD-10-CM | POA: Diagnosis not present

## 2018-11-26 DIAGNOSIS — M21372 Foot drop, left foot: Secondary | ICD-10-CM | POA: Diagnosis not present

## 2018-11-26 DIAGNOSIS — M6281 Muscle weakness (generalized): Secondary | ICD-10-CM | POA: Diagnosis not present

## 2018-11-26 DIAGNOSIS — M21371 Foot drop, right foot: Secondary | ICD-10-CM | POA: Diagnosis not present

## 2018-11-27 DIAGNOSIS — Z20828 Contact with and (suspected) exposure to other viral communicable diseases: Secondary | ICD-10-CM | POA: Diagnosis not present

## 2018-11-27 DIAGNOSIS — M6281 Muscle weakness (generalized): Secondary | ICD-10-CM | POA: Diagnosis not present

## 2018-11-27 DIAGNOSIS — M21372 Foot drop, left foot: Secondary | ICD-10-CM | POA: Diagnosis not present

## 2018-11-27 DIAGNOSIS — M21371 Foot drop, right foot: Secondary | ICD-10-CM | POA: Diagnosis not present

## 2018-11-28 DIAGNOSIS — M21371 Foot drop, right foot: Secondary | ICD-10-CM | POA: Diagnosis not present

## 2018-11-28 DIAGNOSIS — M6281 Muscle weakness (generalized): Secondary | ICD-10-CM | POA: Diagnosis not present

## 2018-11-28 DIAGNOSIS — M21372 Foot drop, left foot: Secondary | ICD-10-CM | POA: Diagnosis not present

## 2018-11-30 DIAGNOSIS — R319 Hematuria, unspecified: Secondary | ICD-10-CM | POA: Diagnosis not present

## 2018-11-30 DIAGNOSIS — Z79899 Other long term (current) drug therapy: Secondary | ICD-10-CM | POA: Diagnosis not present

## 2018-11-30 DIAGNOSIS — R300 Dysuria: Secondary | ICD-10-CM | POA: Diagnosis not present

## 2018-11-30 DIAGNOSIS — N390 Urinary tract infection, site not specified: Secondary | ICD-10-CM | POA: Diagnosis not present

## 2018-12-04 DIAGNOSIS — Z20828 Contact with and (suspected) exposure to other viral communicable diseases: Secondary | ICD-10-CM | POA: Diagnosis not present

## 2018-12-05 DIAGNOSIS — M21371 Foot drop, right foot: Secondary | ICD-10-CM | POA: Diagnosis not present

## 2018-12-05 DIAGNOSIS — M21372 Foot drop, left foot: Secondary | ICD-10-CM | POA: Diagnosis not present

## 2018-12-05 DIAGNOSIS — M6281 Muscle weakness (generalized): Secondary | ICD-10-CM | POA: Diagnosis not present

## 2018-12-06 DIAGNOSIS — D649 Anemia, unspecified: Secondary | ICD-10-CM | POA: Diagnosis not present

## 2018-12-06 DIAGNOSIS — I10 Essential (primary) hypertension: Secondary | ICD-10-CM | POA: Diagnosis not present

## 2018-12-07 DIAGNOSIS — E119 Type 2 diabetes mellitus without complications: Secondary | ICD-10-CM | POA: Diagnosis not present

## 2018-12-07 DIAGNOSIS — I5032 Chronic diastolic (congestive) heart failure: Secondary | ICD-10-CM | POA: Diagnosis not present

## 2018-12-07 DIAGNOSIS — I110 Hypertensive heart disease with heart failure: Secondary | ICD-10-CM | POA: Diagnosis not present

## 2018-12-07 DIAGNOSIS — E039 Hypothyroidism, unspecified: Secondary | ICD-10-CM | POA: Diagnosis not present

## 2018-12-09 DIAGNOSIS — E039 Hypothyroidism, unspecified: Secondary | ICD-10-CM | POA: Diagnosis not present

## 2018-12-09 DIAGNOSIS — Z1159 Encounter for screening for other viral diseases: Secondary | ICD-10-CM | POA: Diagnosis not present

## 2018-12-09 DIAGNOSIS — I110 Hypertensive heart disease with heart failure: Secondary | ICD-10-CM | POA: Diagnosis not present

## 2018-12-09 DIAGNOSIS — A498 Other bacterial infections of unspecified site: Secondary | ICD-10-CM | POA: Diagnosis not present

## 2018-12-09 DIAGNOSIS — I5032 Chronic diastolic (congestive) heart failure: Secondary | ICD-10-CM | POA: Diagnosis not present

## 2018-12-16 DIAGNOSIS — M21371 Foot drop, right foot: Secondary | ICD-10-CM | POA: Diagnosis not present

## 2018-12-16 DIAGNOSIS — M6281 Muscle weakness (generalized): Secondary | ICD-10-CM | POA: Diagnosis not present

## 2018-12-16 DIAGNOSIS — M21372 Foot drop, left foot: Secondary | ICD-10-CM | POA: Diagnosis not present

## 2018-12-17 DIAGNOSIS — G301 Alzheimer's disease with late onset: Secondary | ICD-10-CM | POA: Diagnosis not present

## 2018-12-17 DIAGNOSIS — E119 Type 2 diabetes mellitus without complications: Secondary | ICD-10-CM | POA: Diagnosis not present

## 2018-12-17 DIAGNOSIS — F0280 Dementia in other diseases classified elsewhere without behavioral disturbance: Secondary | ICD-10-CM | POA: Diagnosis not present

## 2018-12-17 DIAGNOSIS — I10 Essential (primary) hypertension: Secondary | ICD-10-CM | POA: Diagnosis not present

## 2018-12-18 DIAGNOSIS — Z03818 Encounter for observation for suspected exposure to other biological agents ruled out: Secondary | ICD-10-CM | POA: Diagnosis not present

## 2018-12-23 DIAGNOSIS — Z03818 Encounter for observation for suspected exposure to other biological agents ruled out: Secondary | ICD-10-CM | POA: Diagnosis not present

## 2018-12-27 DIAGNOSIS — M21372 Foot drop, left foot: Secondary | ICD-10-CM | POA: Diagnosis not present

## 2018-12-27 DIAGNOSIS — M21371 Foot drop, right foot: Secondary | ICD-10-CM | POA: Diagnosis not present

## 2018-12-27 DIAGNOSIS — R1312 Dysphagia, oropharyngeal phase: Secondary | ICD-10-CM | POA: Diagnosis not present

## 2018-12-30 DIAGNOSIS — Z03818 Encounter for observation for suspected exposure to other biological agents ruled out: Secondary | ICD-10-CM | POA: Diagnosis not present

## 2019-01-01 DIAGNOSIS — M21371 Foot drop, right foot: Secondary | ICD-10-CM | POA: Diagnosis not present

## 2019-01-01 DIAGNOSIS — M21372 Foot drop, left foot: Secondary | ICD-10-CM | POA: Diagnosis not present

## 2019-01-01 DIAGNOSIS — R1312 Dysphagia, oropharyngeal phase: Secondary | ICD-10-CM | POA: Diagnosis not present

## 2019-01-02 DIAGNOSIS — M21372 Foot drop, left foot: Secondary | ICD-10-CM | POA: Diagnosis not present

## 2019-01-02 DIAGNOSIS — M21371 Foot drop, right foot: Secondary | ICD-10-CM | POA: Diagnosis not present

## 2019-01-02 DIAGNOSIS — R1312 Dysphagia, oropharyngeal phase: Secondary | ICD-10-CM | POA: Diagnosis not present

## 2019-01-03 DIAGNOSIS — R1312 Dysphagia, oropharyngeal phase: Secondary | ICD-10-CM | POA: Diagnosis not present

## 2019-01-03 DIAGNOSIS — M21371 Foot drop, right foot: Secondary | ICD-10-CM | POA: Diagnosis not present

## 2019-01-03 DIAGNOSIS — M21372 Foot drop, left foot: Secondary | ICD-10-CM | POA: Diagnosis not present

## 2019-01-06 DIAGNOSIS — M21371 Foot drop, right foot: Secondary | ICD-10-CM | POA: Diagnosis not present

## 2019-01-06 DIAGNOSIS — I110 Hypertensive heart disease with heart failure: Secondary | ICD-10-CM | POA: Diagnosis not present

## 2019-01-06 DIAGNOSIS — I5032 Chronic diastolic (congestive) heart failure: Secondary | ICD-10-CM | POA: Diagnosis not present

## 2019-01-06 DIAGNOSIS — Z1159 Encounter for screening for other viral diseases: Secondary | ICD-10-CM | POA: Diagnosis not present

## 2019-01-06 DIAGNOSIS — R1312 Dysphagia, oropharyngeal phase: Secondary | ICD-10-CM | POA: Diagnosis not present

## 2019-01-06 DIAGNOSIS — M21372 Foot drop, left foot: Secondary | ICD-10-CM | POA: Diagnosis not present

## 2019-01-06 DIAGNOSIS — E039 Hypothyroidism, unspecified: Secondary | ICD-10-CM | POA: Diagnosis not present

## 2019-01-06 DIAGNOSIS — J431 Panlobular emphysema: Secondary | ICD-10-CM | POA: Diagnosis not present

## 2019-01-07 DIAGNOSIS — E119 Type 2 diabetes mellitus without complications: Secondary | ICD-10-CM | POA: Diagnosis not present

## 2019-01-07 DIAGNOSIS — E559 Vitamin D deficiency, unspecified: Secondary | ICD-10-CM | POA: Diagnosis not present

## 2019-01-07 DIAGNOSIS — R1312 Dysphagia, oropharyngeal phase: Secondary | ICD-10-CM | POA: Diagnosis not present

## 2019-01-07 DIAGNOSIS — Z79899 Other long term (current) drug therapy: Secondary | ICD-10-CM | POA: Diagnosis not present

## 2019-01-07 DIAGNOSIS — M21372 Foot drop, left foot: Secondary | ICD-10-CM | POA: Diagnosis not present

## 2019-01-07 DIAGNOSIS — I10 Essential (primary) hypertension: Secondary | ICD-10-CM | POA: Diagnosis not present

## 2019-01-07 DIAGNOSIS — E039 Hypothyroidism, unspecified: Secondary | ICD-10-CM | POA: Diagnosis not present

## 2019-01-07 DIAGNOSIS — M21371 Foot drop, right foot: Secondary | ICD-10-CM | POA: Diagnosis not present

## 2019-01-07 DIAGNOSIS — R7989 Other specified abnormal findings of blood chemistry: Secondary | ICD-10-CM | POA: Diagnosis not present

## 2019-01-07 DIAGNOSIS — D649 Anemia, unspecified: Secondary | ICD-10-CM | POA: Diagnosis not present

## 2019-01-08 DIAGNOSIS — M21371 Foot drop, right foot: Secondary | ICD-10-CM | POA: Diagnosis not present

## 2019-01-08 DIAGNOSIS — M21372 Foot drop, left foot: Secondary | ICD-10-CM | POA: Diagnosis not present

## 2019-01-08 DIAGNOSIS — N390 Urinary tract infection, site not specified: Secondary | ICD-10-CM | POA: Diagnosis not present

## 2019-01-08 DIAGNOSIS — Z1612 Extended spectrum beta lactamase (ESBL) resistance: Secondary | ICD-10-CM | POA: Diagnosis not present

## 2019-01-08 DIAGNOSIS — Z79899 Other long term (current) drug therapy: Secondary | ICD-10-CM | POA: Diagnosis not present

## 2019-01-08 DIAGNOSIS — R1312 Dysphagia, oropharyngeal phase: Secondary | ICD-10-CM | POA: Diagnosis not present

## 2019-01-08 DIAGNOSIS — R319 Hematuria, unspecified: Secondary | ICD-10-CM | POA: Diagnosis not present

## 2019-01-09 DIAGNOSIS — M21371 Foot drop, right foot: Secondary | ICD-10-CM | POA: Diagnosis not present

## 2019-01-09 DIAGNOSIS — M21372 Foot drop, left foot: Secondary | ICD-10-CM | POA: Diagnosis not present

## 2019-01-09 DIAGNOSIS — R1312 Dysphagia, oropharyngeal phase: Secondary | ICD-10-CM | POA: Diagnosis not present

## 2019-01-10 DIAGNOSIS — M21372 Foot drop, left foot: Secondary | ICD-10-CM | POA: Diagnosis not present

## 2019-01-10 DIAGNOSIS — M21371 Foot drop, right foot: Secondary | ICD-10-CM | POA: Diagnosis not present

## 2019-01-10 DIAGNOSIS — R1312 Dysphagia, oropharyngeal phase: Secondary | ICD-10-CM | POA: Diagnosis not present

## 2019-01-13 DIAGNOSIS — M21372 Foot drop, left foot: Secondary | ICD-10-CM | POA: Diagnosis not present

## 2019-01-13 DIAGNOSIS — M21371 Foot drop, right foot: Secondary | ICD-10-CM | POA: Diagnosis not present

## 2019-01-13 DIAGNOSIS — Z03818 Encounter for observation for suspected exposure to other biological agents ruled out: Secondary | ICD-10-CM | POA: Diagnosis not present

## 2019-01-13 DIAGNOSIS — R1312 Dysphagia, oropharyngeal phase: Secondary | ICD-10-CM | POA: Diagnosis not present

## 2019-01-14 DIAGNOSIS — M21372 Foot drop, left foot: Secondary | ICD-10-CM | POA: Diagnosis not present

## 2019-01-14 DIAGNOSIS — M21371 Foot drop, right foot: Secondary | ICD-10-CM | POA: Diagnosis not present

## 2019-01-14 DIAGNOSIS — R1312 Dysphagia, oropharyngeal phase: Secondary | ICD-10-CM | POA: Diagnosis not present

## 2019-01-18 DIAGNOSIS — I5032 Chronic diastolic (congestive) heart failure: Secondary | ICD-10-CM | POA: Diagnosis not present

## 2019-01-18 DIAGNOSIS — E46 Unspecified protein-calorie malnutrition: Secondary | ICD-10-CM | POA: Diagnosis not present

## 2019-01-18 DIAGNOSIS — I10 Essential (primary) hypertension: Secondary | ICD-10-CM | POA: Diagnosis not present

## 2019-01-20 DIAGNOSIS — Z03818 Encounter for observation for suspected exposure to other biological agents ruled out: Secondary | ICD-10-CM | POA: Diagnosis not present

## 2019-01-27 DIAGNOSIS — Z03818 Encounter for observation for suspected exposure to other biological agents ruled out: Secondary | ICD-10-CM | POA: Diagnosis not present

## 2019-01-28 DIAGNOSIS — M25532 Pain in left wrist: Secondary | ICD-10-CM | POA: Diagnosis not present

## 2019-01-28 DIAGNOSIS — M79642 Pain in left hand: Secondary | ICD-10-CM | POA: Diagnosis not present

## 2019-01-30 DIAGNOSIS — S638X2S Sprain of other part of left wrist and hand, sequela: Secondary | ICD-10-CM | POA: Diagnosis not present

## 2019-02-03 DIAGNOSIS — J431 Panlobular emphysema: Secondary | ICD-10-CM | POA: Diagnosis not present

## 2019-02-03 DIAGNOSIS — I110 Hypertensive heart disease with heart failure: Secondary | ICD-10-CM | POA: Diagnosis not present

## 2019-02-03 DIAGNOSIS — Z03818 Encounter for observation for suspected exposure to other biological agents ruled out: Secondary | ICD-10-CM | POA: Diagnosis not present

## 2019-02-03 DIAGNOSIS — I5032 Chronic diastolic (congestive) heart failure: Secondary | ICD-10-CM | POA: Diagnosis not present

## 2019-02-03 DIAGNOSIS — E039 Hypothyroidism, unspecified: Secondary | ICD-10-CM | POA: Diagnosis not present

## 2019-02-09 DIAGNOSIS — I10 Essential (primary) hypertension: Secondary | ICD-10-CM | POA: Diagnosis not present

## 2019-02-09 DIAGNOSIS — J431 Panlobular emphysema: Secondary | ICD-10-CM | POA: Diagnosis not present

## 2019-02-09 DIAGNOSIS — E119 Type 2 diabetes mellitus without complications: Secondary | ICD-10-CM | POA: Diagnosis not present

## 2019-02-09 DIAGNOSIS — I2699 Other pulmonary embolism without acute cor pulmonale: Secondary | ICD-10-CM | POA: Diagnosis not present

## 2019-02-10 DIAGNOSIS — Z741 Need for assistance with personal care: Secondary | ICD-10-CM | POA: Diagnosis not present

## 2019-02-10 DIAGNOSIS — M6281 Muscle weakness (generalized): Secondary | ICD-10-CM | POA: Diagnosis not present

## 2019-02-10 DIAGNOSIS — R278 Other lack of coordination: Secondary | ICD-10-CM | POA: Diagnosis not present

## 2019-02-12 DIAGNOSIS — R278 Other lack of coordination: Secondary | ICD-10-CM | POA: Diagnosis not present

## 2019-02-12 DIAGNOSIS — Z741 Need for assistance with personal care: Secondary | ICD-10-CM | POA: Diagnosis not present

## 2019-02-12 DIAGNOSIS — M6281 Muscle weakness (generalized): Secondary | ICD-10-CM | POA: Diagnosis not present

## 2019-02-13 DIAGNOSIS — M6281 Muscle weakness (generalized): Secondary | ICD-10-CM | POA: Diagnosis not present

## 2019-02-13 DIAGNOSIS — R278 Other lack of coordination: Secondary | ICD-10-CM | POA: Diagnosis not present

## 2019-02-13 DIAGNOSIS — Z741 Need for assistance with personal care: Secondary | ICD-10-CM | POA: Diagnosis not present

## 2019-02-14 DIAGNOSIS — R278 Other lack of coordination: Secondary | ICD-10-CM | POA: Diagnosis not present

## 2019-02-14 DIAGNOSIS — Z741 Need for assistance with personal care: Secondary | ICD-10-CM | POA: Diagnosis not present

## 2019-02-14 DIAGNOSIS — M6281 Muscle weakness (generalized): Secondary | ICD-10-CM | POA: Diagnosis not present

## 2019-02-17 DIAGNOSIS — Z741 Need for assistance with personal care: Secondary | ICD-10-CM | POA: Diagnosis not present

## 2019-02-17 DIAGNOSIS — R278 Other lack of coordination: Secondary | ICD-10-CM | POA: Diagnosis not present

## 2019-02-17 DIAGNOSIS — M6281 Muscle weakness (generalized): Secondary | ICD-10-CM | POA: Diagnosis not present

## 2019-02-18 DIAGNOSIS — I10 Essential (primary) hypertension: Secondary | ICD-10-CM | POA: Diagnosis not present

## 2019-02-18 DIAGNOSIS — R278 Other lack of coordination: Secondary | ICD-10-CM | POA: Diagnosis not present

## 2019-02-18 DIAGNOSIS — Z741 Need for assistance with personal care: Secondary | ICD-10-CM | POA: Diagnosis not present

## 2019-02-18 DIAGNOSIS — E119 Type 2 diabetes mellitus without complications: Secondary | ICD-10-CM | POA: Diagnosis not present

## 2019-02-18 DIAGNOSIS — M6281 Muscle weakness (generalized): Secondary | ICD-10-CM | POA: Diagnosis not present

## 2019-02-18 DIAGNOSIS — G301 Alzheimer's disease with late onset: Secondary | ICD-10-CM | POA: Diagnosis not present

## 2019-02-18 DIAGNOSIS — F0280 Dementia in other diseases classified elsewhere without behavioral disturbance: Secondary | ICD-10-CM | POA: Diagnosis not present

## 2019-02-19 DIAGNOSIS — Z741 Need for assistance with personal care: Secondary | ICD-10-CM | POA: Diagnosis not present

## 2019-02-19 DIAGNOSIS — M19132 Post-traumatic osteoarthritis, left wrist: Secondary | ICD-10-CM | POA: Diagnosis not present

## 2019-02-19 DIAGNOSIS — M19042 Primary osteoarthritis, left hand: Secondary | ICD-10-CM | POA: Diagnosis not present

## 2019-02-19 DIAGNOSIS — M6281 Muscle weakness (generalized): Secondary | ICD-10-CM | POA: Diagnosis not present

## 2019-02-19 DIAGNOSIS — R278 Other lack of coordination: Secondary | ICD-10-CM | POA: Diagnosis not present

## 2019-02-21 DIAGNOSIS — Z741 Need for assistance with personal care: Secondary | ICD-10-CM | POA: Diagnosis not present

## 2019-02-21 DIAGNOSIS — R278 Other lack of coordination: Secondary | ICD-10-CM | POA: Diagnosis not present

## 2019-02-21 DIAGNOSIS — M6281 Muscle weakness (generalized): Secondary | ICD-10-CM | POA: Diagnosis not present

## 2019-02-22 DIAGNOSIS — Z741 Need for assistance with personal care: Secondary | ICD-10-CM | POA: Diagnosis not present

## 2019-02-22 DIAGNOSIS — M6281 Muscle weakness (generalized): Secondary | ICD-10-CM | POA: Diagnosis not present

## 2019-02-22 DIAGNOSIS — R278 Other lack of coordination: Secondary | ICD-10-CM | POA: Diagnosis not present

## 2019-02-24 DIAGNOSIS — R278 Other lack of coordination: Secondary | ICD-10-CM | POA: Diagnosis not present

## 2019-02-24 DIAGNOSIS — M6281 Muscle weakness (generalized): Secondary | ICD-10-CM | POA: Diagnosis not present

## 2019-02-24 DIAGNOSIS — Z741 Need for assistance with personal care: Secondary | ICD-10-CM | POA: Diagnosis not present

## 2019-02-25 DIAGNOSIS — Z741 Need for assistance with personal care: Secondary | ICD-10-CM | POA: Diagnosis not present

## 2019-02-25 DIAGNOSIS — M6281 Muscle weakness (generalized): Secondary | ICD-10-CM | POA: Diagnosis not present

## 2019-02-25 DIAGNOSIS — R278 Other lack of coordination: Secondary | ICD-10-CM | POA: Diagnosis not present

## 2019-02-26 DIAGNOSIS — Z741 Need for assistance with personal care: Secondary | ICD-10-CM | POA: Diagnosis not present

## 2019-02-26 DIAGNOSIS — R278 Other lack of coordination: Secondary | ICD-10-CM | POA: Diagnosis not present

## 2019-02-26 DIAGNOSIS — M6281 Muscle weakness (generalized): Secondary | ICD-10-CM | POA: Diagnosis not present

## 2019-02-27 DIAGNOSIS — R278 Other lack of coordination: Secondary | ICD-10-CM | POA: Diagnosis not present

## 2019-02-27 DIAGNOSIS — M6281 Muscle weakness (generalized): Secondary | ICD-10-CM | POA: Diagnosis not present

## 2019-02-27 DIAGNOSIS — Z741 Need for assistance with personal care: Secondary | ICD-10-CM | POA: Diagnosis not present

## 2019-02-28 DIAGNOSIS — Z741 Need for assistance with personal care: Secondary | ICD-10-CM | POA: Diagnosis not present

## 2019-02-28 DIAGNOSIS — M6281 Muscle weakness (generalized): Secondary | ICD-10-CM | POA: Diagnosis not present

## 2019-02-28 DIAGNOSIS — R278 Other lack of coordination: Secondary | ICD-10-CM | POA: Diagnosis not present

## 2019-03-03 DIAGNOSIS — R278 Other lack of coordination: Secondary | ICD-10-CM | POA: Diagnosis not present

## 2019-03-03 DIAGNOSIS — M6281 Muscle weakness (generalized): Secondary | ICD-10-CM | POA: Diagnosis not present

## 2019-03-03 DIAGNOSIS — I5032 Chronic diastolic (congestive) heart failure: Secondary | ICD-10-CM | POA: Diagnosis not present

## 2019-03-03 DIAGNOSIS — I110 Hypertensive heart disease with heart failure: Secondary | ICD-10-CM | POA: Diagnosis not present

## 2019-03-03 DIAGNOSIS — E039 Hypothyroidism, unspecified: Secondary | ICD-10-CM | POA: Diagnosis not present

## 2019-03-03 DIAGNOSIS — Z741 Need for assistance with personal care: Secondary | ICD-10-CM | POA: Diagnosis not present

## 2019-03-03 DIAGNOSIS — J431 Panlobular emphysema: Secondary | ICD-10-CM | POA: Diagnosis not present

## 2019-03-04 DIAGNOSIS — Z741 Need for assistance with personal care: Secondary | ICD-10-CM | POA: Diagnosis not present

## 2019-03-04 DIAGNOSIS — M6281 Muscle weakness (generalized): Secondary | ICD-10-CM | POA: Diagnosis not present

## 2019-03-04 DIAGNOSIS — R278 Other lack of coordination: Secondary | ICD-10-CM | POA: Diagnosis not present

## 2019-03-05 DIAGNOSIS — M6281 Muscle weakness (generalized): Secondary | ICD-10-CM | POA: Diagnosis not present

## 2019-03-05 DIAGNOSIS — R278 Other lack of coordination: Secondary | ICD-10-CM | POA: Diagnosis not present

## 2019-03-05 DIAGNOSIS — Z741 Need for assistance with personal care: Secondary | ICD-10-CM | POA: Diagnosis not present

## 2019-03-06 DIAGNOSIS — M6281 Muscle weakness (generalized): Secondary | ICD-10-CM | POA: Diagnosis not present

## 2019-03-06 DIAGNOSIS — R278 Other lack of coordination: Secondary | ICD-10-CM | POA: Diagnosis not present

## 2019-03-06 DIAGNOSIS — Z741 Need for assistance with personal care: Secondary | ICD-10-CM | POA: Diagnosis not present

## 2019-03-07 DIAGNOSIS — Z760 Encounter for issue of repeat prescription: Secondary | ICD-10-CM | POA: Diagnosis not present

## 2019-03-07 DIAGNOSIS — M6281 Muscle weakness (generalized): Secondary | ICD-10-CM | POA: Diagnosis not present

## 2019-03-07 DIAGNOSIS — Z741 Need for assistance with personal care: Secondary | ICD-10-CM | POA: Diagnosis not present

## 2019-03-07 DIAGNOSIS — R278 Other lack of coordination: Secondary | ICD-10-CM | POA: Diagnosis not present

## 2019-03-09 DIAGNOSIS — I10 Essential (primary) hypertension: Secondary | ICD-10-CM | POA: Diagnosis not present

## 2019-03-09 DIAGNOSIS — E119 Type 2 diabetes mellitus without complications: Secondary | ICD-10-CM | POA: Diagnosis not present

## 2019-03-09 DIAGNOSIS — E871 Hypo-osmolality and hyponatremia: Secondary | ICD-10-CM | POA: Diagnosis not present

## 2019-03-09 DIAGNOSIS — I110 Hypertensive heart disease with heart failure: Secondary | ICD-10-CM | POA: Diagnosis not present

## 2019-03-10 DIAGNOSIS — M6281 Muscle weakness (generalized): Secondary | ICD-10-CM | POA: Diagnosis not present

## 2019-03-10 DIAGNOSIS — R278 Other lack of coordination: Secondary | ICD-10-CM | POA: Diagnosis not present

## 2019-03-10 DIAGNOSIS — Z741 Need for assistance with personal care: Secondary | ICD-10-CM | POA: Diagnosis not present

## 2019-03-11 DIAGNOSIS — R278 Other lack of coordination: Secondary | ICD-10-CM | POA: Diagnosis not present

## 2019-03-11 DIAGNOSIS — Z741 Need for assistance with personal care: Secondary | ICD-10-CM | POA: Diagnosis not present

## 2019-03-11 DIAGNOSIS — M6281 Muscle weakness (generalized): Secondary | ICD-10-CM | POA: Diagnosis not present

## 2019-03-12 DIAGNOSIS — R278 Other lack of coordination: Secondary | ICD-10-CM | POA: Diagnosis not present

## 2019-03-12 DIAGNOSIS — M6281 Muscle weakness (generalized): Secondary | ICD-10-CM | POA: Diagnosis not present

## 2019-03-12 DIAGNOSIS — Z741 Need for assistance with personal care: Secondary | ICD-10-CM | POA: Diagnosis not present

## 2019-03-13 DIAGNOSIS — Z741 Need for assistance with personal care: Secondary | ICD-10-CM | POA: Diagnosis not present

## 2019-03-13 DIAGNOSIS — M6281 Muscle weakness (generalized): Secondary | ICD-10-CM | POA: Diagnosis not present

## 2019-03-13 DIAGNOSIS — R278 Other lack of coordination: Secondary | ICD-10-CM | POA: Diagnosis not present

## 2019-03-14 DIAGNOSIS — Z741 Need for assistance with personal care: Secondary | ICD-10-CM | POA: Diagnosis not present

## 2019-03-14 DIAGNOSIS — R278 Other lack of coordination: Secondary | ICD-10-CM | POA: Diagnosis not present

## 2019-03-14 DIAGNOSIS — M6281 Muscle weakness (generalized): Secondary | ICD-10-CM | POA: Diagnosis not present

## 2019-03-17 DIAGNOSIS — R278 Other lack of coordination: Secondary | ICD-10-CM | POA: Diagnosis not present

## 2019-03-17 DIAGNOSIS — M6281 Muscle weakness (generalized): Secondary | ICD-10-CM | POA: Diagnosis not present

## 2019-03-17 DIAGNOSIS — Z741 Need for assistance with personal care: Secondary | ICD-10-CM | POA: Diagnosis not present

## 2019-03-18 DIAGNOSIS — M6281 Muscle weakness (generalized): Secondary | ICD-10-CM | POA: Diagnosis not present

## 2019-03-18 DIAGNOSIS — R278 Other lack of coordination: Secondary | ICD-10-CM | POA: Diagnosis not present

## 2019-03-18 DIAGNOSIS — Z741 Need for assistance with personal care: Secondary | ICD-10-CM | POA: Diagnosis not present

## 2019-03-19 DIAGNOSIS — Z741 Need for assistance with personal care: Secondary | ICD-10-CM | POA: Diagnosis not present

## 2019-03-19 DIAGNOSIS — M6281 Muscle weakness (generalized): Secondary | ICD-10-CM | POA: Diagnosis not present

## 2019-03-19 DIAGNOSIS — R278 Other lack of coordination: Secondary | ICD-10-CM | POA: Diagnosis not present

## 2019-03-20 DIAGNOSIS — G301 Alzheimer's disease with late onset: Secondary | ICD-10-CM | POA: Diagnosis not present

## 2019-03-20 DIAGNOSIS — I10 Essential (primary) hypertension: Secondary | ICD-10-CM | POA: Diagnosis not present

## 2019-03-20 DIAGNOSIS — Z741 Need for assistance with personal care: Secondary | ICD-10-CM | POA: Diagnosis not present

## 2019-03-20 DIAGNOSIS — E119 Type 2 diabetes mellitus without complications: Secondary | ICD-10-CM | POA: Diagnosis not present

## 2019-03-20 DIAGNOSIS — F0280 Dementia in other diseases classified elsewhere without behavioral disturbance: Secondary | ICD-10-CM | POA: Diagnosis not present

## 2019-03-20 DIAGNOSIS — M6281 Muscle weakness (generalized): Secondary | ICD-10-CM | POA: Diagnosis not present

## 2019-03-20 DIAGNOSIS — R278 Other lack of coordination: Secondary | ICD-10-CM | POA: Diagnosis not present

## 2019-03-21 DIAGNOSIS — Z741 Need for assistance with personal care: Secondary | ICD-10-CM | POA: Diagnosis not present

## 2019-03-21 DIAGNOSIS — M6281 Muscle weakness (generalized): Secondary | ICD-10-CM | POA: Diagnosis not present

## 2019-03-21 DIAGNOSIS — R278 Other lack of coordination: Secondary | ICD-10-CM | POA: Diagnosis not present

## 2019-03-24 DIAGNOSIS — R278 Other lack of coordination: Secondary | ICD-10-CM | POA: Diagnosis not present

## 2019-03-24 DIAGNOSIS — M21372 Foot drop, left foot: Secondary | ICD-10-CM | POA: Diagnosis not present

## 2019-03-24 DIAGNOSIS — M21371 Foot drop, right foot: Secondary | ICD-10-CM | POA: Diagnosis not present

## 2019-03-24 DIAGNOSIS — M6281 Muscle weakness (generalized): Secondary | ICD-10-CM | POA: Diagnosis not present

## 2019-03-24 DIAGNOSIS — Z741 Need for assistance with personal care: Secondary | ICD-10-CM | POA: Diagnosis not present

## 2019-03-25 DIAGNOSIS — M21372 Foot drop, left foot: Secondary | ICD-10-CM | POA: Diagnosis not present

## 2019-03-25 DIAGNOSIS — R278 Other lack of coordination: Secondary | ICD-10-CM | POA: Diagnosis not present

## 2019-03-25 DIAGNOSIS — M21371 Foot drop, right foot: Secondary | ICD-10-CM | POA: Diagnosis not present

## 2019-03-25 DIAGNOSIS — Z741 Need for assistance with personal care: Secondary | ICD-10-CM | POA: Diagnosis not present

## 2019-03-25 DIAGNOSIS — M6281 Muscle weakness (generalized): Secondary | ICD-10-CM | POA: Diagnosis not present

## 2019-03-26 DIAGNOSIS — Z741 Need for assistance with personal care: Secondary | ICD-10-CM | POA: Diagnosis not present

## 2019-03-26 DIAGNOSIS — M21372 Foot drop, left foot: Secondary | ICD-10-CM | POA: Diagnosis not present

## 2019-03-26 DIAGNOSIS — M21371 Foot drop, right foot: Secondary | ICD-10-CM | POA: Diagnosis not present

## 2019-03-26 DIAGNOSIS — R278 Other lack of coordination: Secondary | ICD-10-CM | POA: Diagnosis not present

## 2019-03-26 DIAGNOSIS — M6281 Muscle weakness (generalized): Secondary | ICD-10-CM | POA: Diagnosis not present

## 2019-03-27 DIAGNOSIS — R278 Other lack of coordination: Secondary | ICD-10-CM | POA: Diagnosis not present

## 2019-03-27 DIAGNOSIS — M21372 Foot drop, left foot: Secondary | ICD-10-CM | POA: Diagnosis not present

## 2019-03-27 DIAGNOSIS — M21371 Foot drop, right foot: Secondary | ICD-10-CM | POA: Diagnosis not present

## 2019-03-27 DIAGNOSIS — Z741 Need for assistance with personal care: Secondary | ICD-10-CM | POA: Diagnosis not present

## 2019-03-27 DIAGNOSIS — M6281 Muscle weakness (generalized): Secondary | ICD-10-CM | POA: Diagnosis not present

## 2019-03-28 DIAGNOSIS — M21371 Foot drop, right foot: Secondary | ICD-10-CM | POA: Diagnosis not present

## 2019-03-28 DIAGNOSIS — Z741 Need for assistance with personal care: Secondary | ICD-10-CM | POA: Diagnosis not present

## 2019-03-28 DIAGNOSIS — R278 Other lack of coordination: Secondary | ICD-10-CM | POA: Diagnosis not present

## 2019-03-28 DIAGNOSIS — M21372 Foot drop, left foot: Secondary | ICD-10-CM | POA: Diagnosis not present

## 2019-03-28 DIAGNOSIS — M6281 Muscle weakness (generalized): Secondary | ICD-10-CM | POA: Diagnosis not present

## 2019-03-31 DIAGNOSIS — M6281 Muscle weakness (generalized): Secondary | ICD-10-CM | POA: Diagnosis not present

## 2019-03-31 DIAGNOSIS — Z741 Need for assistance with personal care: Secondary | ICD-10-CM | POA: Diagnosis not present

## 2019-03-31 DIAGNOSIS — M21371 Foot drop, right foot: Secondary | ICD-10-CM | POA: Diagnosis not present

## 2019-03-31 DIAGNOSIS — Z03818 Encounter for observation for suspected exposure to other biological agents ruled out: Secondary | ICD-10-CM | POA: Diagnosis not present

## 2019-03-31 DIAGNOSIS — M21372 Foot drop, left foot: Secondary | ICD-10-CM | POA: Diagnosis not present

## 2019-03-31 DIAGNOSIS — R278 Other lack of coordination: Secondary | ICD-10-CM | POA: Diagnosis not present

## 2019-04-01 DIAGNOSIS — M6281 Muscle weakness (generalized): Secondary | ICD-10-CM | POA: Diagnosis not present

## 2019-04-01 DIAGNOSIS — R278 Other lack of coordination: Secondary | ICD-10-CM | POA: Diagnosis not present

## 2019-04-01 DIAGNOSIS — M21372 Foot drop, left foot: Secondary | ICD-10-CM | POA: Diagnosis not present

## 2019-04-01 DIAGNOSIS — M21371 Foot drop, right foot: Secondary | ICD-10-CM | POA: Diagnosis not present

## 2019-04-01 DIAGNOSIS — Z741 Need for assistance with personal care: Secondary | ICD-10-CM | POA: Diagnosis not present

## 2019-04-02 DIAGNOSIS — R278 Other lack of coordination: Secondary | ICD-10-CM | POA: Diagnosis not present

## 2019-04-02 DIAGNOSIS — M21372 Foot drop, left foot: Secondary | ICD-10-CM | POA: Diagnosis not present

## 2019-04-02 DIAGNOSIS — Z741 Need for assistance with personal care: Secondary | ICD-10-CM | POA: Diagnosis not present

## 2019-04-02 DIAGNOSIS — M6281 Muscle weakness (generalized): Secondary | ICD-10-CM | POA: Diagnosis not present

## 2019-04-02 DIAGNOSIS — M21371 Foot drop, right foot: Secondary | ICD-10-CM | POA: Diagnosis not present

## 2019-04-03 DIAGNOSIS — R278 Other lack of coordination: Secondary | ICD-10-CM | POA: Diagnosis not present

## 2019-04-03 DIAGNOSIS — I5032 Chronic diastolic (congestive) heart failure: Secondary | ICD-10-CM | POA: Diagnosis not present

## 2019-04-03 DIAGNOSIS — M21371 Foot drop, right foot: Secondary | ICD-10-CM | POA: Diagnosis not present

## 2019-04-03 DIAGNOSIS — E039 Hypothyroidism, unspecified: Secondary | ICD-10-CM | POA: Diagnosis not present

## 2019-04-03 DIAGNOSIS — I110 Hypertensive heart disease with heart failure: Secondary | ICD-10-CM | POA: Diagnosis not present

## 2019-04-03 DIAGNOSIS — M21372 Foot drop, left foot: Secondary | ICD-10-CM | POA: Diagnosis not present

## 2019-04-03 DIAGNOSIS — E119 Type 2 diabetes mellitus without complications: Secondary | ICD-10-CM | POA: Diagnosis not present

## 2019-04-03 DIAGNOSIS — M6281 Muscle weakness (generalized): Secondary | ICD-10-CM | POA: Diagnosis not present

## 2019-04-03 DIAGNOSIS — Z741 Need for assistance with personal care: Secondary | ICD-10-CM | POA: Diagnosis not present

## 2019-04-04 DIAGNOSIS — E871 Hypo-osmolality and hyponatremia: Secondary | ICD-10-CM | POA: Diagnosis not present

## 2019-04-04 DIAGNOSIS — M21372 Foot drop, left foot: Secondary | ICD-10-CM | POA: Diagnosis not present

## 2019-04-04 DIAGNOSIS — Z741 Need for assistance with personal care: Secondary | ICD-10-CM | POA: Diagnosis not present

## 2019-04-04 DIAGNOSIS — J441 Chronic obstructive pulmonary disease with (acute) exacerbation: Secondary | ICD-10-CM | POA: Diagnosis not present

## 2019-04-04 DIAGNOSIS — R278 Other lack of coordination: Secondary | ICD-10-CM | POA: Diagnosis not present

## 2019-04-04 DIAGNOSIS — E559 Vitamin D deficiency, unspecified: Secondary | ICD-10-CM | POA: Diagnosis not present

## 2019-04-04 DIAGNOSIS — M21371 Foot drop, right foot: Secondary | ICD-10-CM | POA: Diagnosis not present

## 2019-04-04 DIAGNOSIS — D649 Anemia, unspecified: Secondary | ICD-10-CM | POA: Diagnosis not present

## 2019-04-04 DIAGNOSIS — E039 Hypothyroidism, unspecified: Secondary | ICD-10-CM | POA: Diagnosis not present

## 2019-04-04 DIAGNOSIS — E119 Type 2 diabetes mellitus without complications: Secondary | ICD-10-CM | POA: Diagnosis not present

## 2019-04-04 DIAGNOSIS — I10 Essential (primary) hypertension: Secondary | ICD-10-CM | POA: Diagnosis not present

## 2019-04-04 DIAGNOSIS — M6281 Muscle weakness (generalized): Secondary | ICD-10-CM | POA: Diagnosis not present

## 2019-04-05 DIAGNOSIS — K047 Periapical abscess without sinus: Secondary | ICD-10-CM | POA: Diagnosis not present

## 2019-04-07 DIAGNOSIS — M21372 Foot drop, left foot: Secondary | ICD-10-CM | POA: Diagnosis not present

## 2019-04-07 DIAGNOSIS — K047 Periapical abscess without sinus: Secondary | ICD-10-CM | POA: Diagnosis not present

## 2019-04-07 DIAGNOSIS — M6281 Muscle weakness (generalized): Secondary | ICD-10-CM | POA: Diagnosis not present

## 2019-04-07 DIAGNOSIS — M21371 Foot drop, right foot: Secondary | ICD-10-CM | POA: Diagnosis not present

## 2019-04-07 DIAGNOSIS — Z03818 Encounter for observation for suspected exposure to other biological agents ruled out: Secondary | ICD-10-CM | POA: Diagnosis not present

## 2019-04-07 DIAGNOSIS — Z741 Need for assistance with personal care: Secondary | ICD-10-CM | POA: Diagnosis not present

## 2019-04-07 DIAGNOSIS — R600 Localized edema: Secondary | ICD-10-CM | POA: Diagnosis not present

## 2019-04-07 DIAGNOSIS — R278 Other lack of coordination: Secondary | ICD-10-CM | POA: Diagnosis not present

## 2019-04-08 DIAGNOSIS — R278 Other lack of coordination: Secondary | ICD-10-CM | POA: Diagnosis not present

## 2019-04-08 DIAGNOSIS — M21371 Foot drop, right foot: Secondary | ICD-10-CM | POA: Diagnosis not present

## 2019-04-08 DIAGNOSIS — M6281 Muscle weakness (generalized): Secondary | ICD-10-CM | POA: Diagnosis not present

## 2019-04-08 DIAGNOSIS — M21372 Foot drop, left foot: Secondary | ICD-10-CM | POA: Diagnosis not present

## 2019-04-08 DIAGNOSIS — Z741 Need for assistance with personal care: Secondary | ICD-10-CM | POA: Diagnosis not present

## 2019-04-09 DIAGNOSIS — Z741 Need for assistance with personal care: Secondary | ICD-10-CM | POA: Diagnosis not present

## 2019-04-09 DIAGNOSIS — M6281 Muscle weakness (generalized): Secondary | ICD-10-CM | POA: Diagnosis not present

## 2019-04-09 DIAGNOSIS — M21371 Foot drop, right foot: Secondary | ICD-10-CM | POA: Diagnosis not present

## 2019-04-09 DIAGNOSIS — R278 Other lack of coordination: Secondary | ICD-10-CM | POA: Diagnosis not present

## 2019-04-09 DIAGNOSIS — M21372 Foot drop, left foot: Secondary | ICD-10-CM | POA: Diagnosis not present

## 2019-04-10 DIAGNOSIS — M21371 Foot drop, right foot: Secondary | ICD-10-CM | POA: Diagnosis not present

## 2019-04-10 DIAGNOSIS — Z741 Need for assistance with personal care: Secondary | ICD-10-CM | POA: Diagnosis not present

## 2019-04-10 DIAGNOSIS — R278 Other lack of coordination: Secondary | ICD-10-CM | POA: Diagnosis not present

## 2019-04-10 DIAGNOSIS — M21372 Foot drop, left foot: Secondary | ICD-10-CM | POA: Diagnosis not present

## 2019-04-10 DIAGNOSIS — M6281 Muscle weakness (generalized): Secondary | ICD-10-CM | POA: Diagnosis not present

## 2019-04-11 DIAGNOSIS — M21372 Foot drop, left foot: Secondary | ICD-10-CM | POA: Diagnosis not present

## 2019-04-11 DIAGNOSIS — R278 Other lack of coordination: Secondary | ICD-10-CM | POA: Diagnosis not present

## 2019-04-11 DIAGNOSIS — Z741 Need for assistance with personal care: Secondary | ICD-10-CM | POA: Diagnosis not present

## 2019-04-11 DIAGNOSIS — M6281 Muscle weakness (generalized): Secondary | ICD-10-CM | POA: Diagnosis not present

## 2019-04-11 DIAGNOSIS — M21371 Foot drop, right foot: Secondary | ICD-10-CM | POA: Diagnosis not present

## 2019-04-12 DIAGNOSIS — M21372 Foot drop, left foot: Secondary | ICD-10-CM | POA: Diagnosis not present

## 2019-04-12 DIAGNOSIS — M6281 Muscle weakness (generalized): Secondary | ICD-10-CM | POA: Diagnosis not present

## 2019-04-12 DIAGNOSIS — Z741 Need for assistance with personal care: Secondary | ICD-10-CM | POA: Diagnosis not present

## 2019-04-12 DIAGNOSIS — M21371 Foot drop, right foot: Secondary | ICD-10-CM | POA: Diagnosis not present

## 2019-04-12 DIAGNOSIS — R278 Other lack of coordination: Secondary | ICD-10-CM | POA: Diagnosis not present

## 2019-04-13 DIAGNOSIS — Z741 Need for assistance with personal care: Secondary | ICD-10-CM | POA: Diagnosis not present

## 2019-04-13 DIAGNOSIS — M21372 Foot drop, left foot: Secondary | ICD-10-CM | POA: Diagnosis not present

## 2019-04-13 DIAGNOSIS — M21371 Foot drop, right foot: Secondary | ICD-10-CM | POA: Diagnosis not present

## 2019-04-13 DIAGNOSIS — R278 Other lack of coordination: Secondary | ICD-10-CM | POA: Diagnosis not present

## 2019-04-13 DIAGNOSIS — M6281 Muscle weakness (generalized): Secondary | ICD-10-CM | POA: Diagnosis not present

## 2019-04-14 DIAGNOSIS — Z03818 Encounter for observation for suspected exposure to other biological agents ruled out: Secondary | ICD-10-CM | POA: Diagnosis not present

## 2019-04-14 DIAGNOSIS — M6281 Muscle weakness (generalized): Secondary | ICD-10-CM | POA: Diagnosis not present

## 2019-04-14 DIAGNOSIS — M21372 Foot drop, left foot: Secondary | ICD-10-CM | POA: Diagnosis not present

## 2019-04-14 DIAGNOSIS — Z741 Need for assistance with personal care: Secondary | ICD-10-CM | POA: Diagnosis not present

## 2019-04-14 DIAGNOSIS — M21371 Foot drop, right foot: Secondary | ICD-10-CM | POA: Diagnosis not present

## 2019-04-14 DIAGNOSIS — R278 Other lack of coordination: Secondary | ICD-10-CM | POA: Diagnosis not present

## 2019-04-15 DIAGNOSIS — M6281 Muscle weakness (generalized): Secondary | ICD-10-CM | POA: Diagnosis not present

## 2019-04-15 DIAGNOSIS — M21371 Foot drop, right foot: Secondary | ICD-10-CM | POA: Diagnosis not present

## 2019-04-15 DIAGNOSIS — M21372 Foot drop, left foot: Secondary | ICD-10-CM | POA: Diagnosis not present

## 2019-04-15 DIAGNOSIS — Z741 Need for assistance with personal care: Secondary | ICD-10-CM | POA: Diagnosis not present

## 2019-04-15 DIAGNOSIS — R278 Other lack of coordination: Secondary | ICD-10-CM | POA: Diagnosis not present

## 2019-04-17 DIAGNOSIS — Z741 Need for assistance with personal care: Secondary | ICD-10-CM | POA: Diagnosis not present

## 2019-04-17 DIAGNOSIS — M21371 Foot drop, right foot: Secondary | ICD-10-CM | POA: Diagnosis not present

## 2019-04-17 DIAGNOSIS — R278 Other lack of coordination: Secondary | ICD-10-CM | POA: Diagnosis not present

## 2019-04-17 DIAGNOSIS — M6281 Muscle weakness (generalized): Secondary | ICD-10-CM | POA: Diagnosis not present

## 2019-04-17 DIAGNOSIS — M21372 Foot drop, left foot: Secondary | ICD-10-CM | POA: Diagnosis not present

## 2019-04-18 DIAGNOSIS — F0280 Dementia in other diseases classified elsewhere without behavioral disturbance: Secondary | ICD-10-CM | POA: Diagnosis not present

## 2019-04-18 DIAGNOSIS — M21372 Foot drop, left foot: Secondary | ICD-10-CM | POA: Diagnosis not present

## 2019-04-18 DIAGNOSIS — M21371 Foot drop, right foot: Secondary | ICD-10-CM | POA: Diagnosis not present

## 2019-04-18 DIAGNOSIS — G301 Alzheimer's disease with late onset: Secondary | ICD-10-CM | POA: Diagnosis not present

## 2019-04-18 DIAGNOSIS — M6281 Muscle weakness (generalized): Secondary | ICD-10-CM | POA: Diagnosis not present

## 2019-04-18 DIAGNOSIS — R278 Other lack of coordination: Secondary | ICD-10-CM | POA: Diagnosis not present

## 2019-04-18 DIAGNOSIS — I10 Essential (primary) hypertension: Secondary | ICD-10-CM | POA: Diagnosis not present

## 2019-04-18 DIAGNOSIS — Z741 Need for assistance with personal care: Secondary | ICD-10-CM | POA: Diagnosis not present

## 2019-04-18 DIAGNOSIS — E119 Type 2 diabetes mellitus without complications: Secondary | ICD-10-CM | POA: Diagnosis not present

## 2019-04-19 DIAGNOSIS — I5032 Chronic diastolic (congestive) heart failure: Secondary | ICD-10-CM | POA: Diagnosis not present

## 2019-04-19 DIAGNOSIS — Z741 Need for assistance with personal care: Secondary | ICD-10-CM | POA: Diagnosis not present

## 2019-04-19 DIAGNOSIS — E039 Hypothyroidism, unspecified: Secondary | ICD-10-CM | POA: Diagnosis not present

## 2019-04-19 DIAGNOSIS — M21372 Foot drop, left foot: Secondary | ICD-10-CM | POA: Diagnosis not present

## 2019-04-19 DIAGNOSIS — R278 Other lack of coordination: Secondary | ICD-10-CM | POA: Diagnosis not present

## 2019-04-19 DIAGNOSIS — R7881 Bacteremia: Secondary | ICD-10-CM | POA: Diagnosis not present

## 2019-04-19 DIAGNOSIS — M21371 Foot drop, right foot: Secondary | ICD-10-CM | POA: Diagnosis not present

## 2019-04-19 DIAGNOSIS — M6281 Muscle weakness (generalized): Secondary | ICD-10-CM | POA: Diagnosis not present

## 2019-04-21 DIAGNOSIS — M21372 Foot drop, left foot: Secondary | ICD-10-CM | POA: Diagnosis not present

## 2019-04-21 DIAGNOSIS — R278 Other lack of coordination: Secondary | ICD-10-CM | POA: Diagnosis not present

## 2019-04-21 DIAGNOSIS — M6281 Muscle weakness (generalized): Secondary | ICD-10-CM | POA: Diagnosis not present

## 2019-04-21 DIAGNOSIS — Z741 Need for assistance with personal care: Secondary | ICD-10-CM | POA: Diagnosis not present

## 2019-04-21 DIAGNOSIS — M21371 Foot drop, right foot: Secondary | ICD-10-CM | POA: Diagnosis not present

## 2019-04-22 DIAGNOSIS — Z03818 Encounter for observation for suspected exposure to other biological agents ruled out: Secondary | ICD-10-CM | POA: Diagnosis not present

## 2019-04-28 DIAGNOSIS — Z03818 Encounter for observation for suspected exposure to other biological agents ruled out: Secondary | ICD-10-CM | POA: Diagnosis not present

## 2019-05-01 DIAGNOSIS — J431 Panlobular emphysema: Secondary | ICD-10-CM | POA: Diagnosis not present

## 2019-05-01 DIAGNOSIS — I110 Hypertensive heart disease with heart failure: Secondary | ICD-10-CM | POA: Diagnosis not present

## 2019-05-01 DIAGNOSIS — I2699 Other pulmonary embolism without acute cor pulmonale: Secondary | ICD-10-CM | POA: Diagnosis not present

## 2019-05-01 DIAGNOSIS — E119 Type 2 diabetes mellitus without complications: Secondary | ICD-10-CM | POA: Diagnosis not present

## 2019-05-19 DIAGNOSIS — Z03818 Encounter for observation for suspected exposure to other biological agents ruled out: Secondary | ICD-10-CM | POA: Diagnosis not present

## 2019-05-24 DIAGNOSIS — M6281 Muscle weakness (generalized): Secondary | ICD-10-CM | POA: Diagnosis not present

## 2019-05-24 DIAGNOSIS — M21371 Foot drop, right foot: Secondary | ICD-10-CM | POA: Diagnosis not present

## 2019-05-24 DIAGNOSIS — M21372 Foot drop, left foot: Secondary | ICD-10-CM | POA: Diagnosis not present

## 2019-05-25 DIAGNOSIS — M21371 Foot drop, right foot: Secondary | ICD-10-CM | POA: Diagnosis not present

## 2019-05-25 DIAGNOSIS — M6281 Muscle weakness (generalized): Secondary | ICD-10-CM | POA: Diagnosis not present

## 2019-05-25 DIAGNOSIS — M21372 Foot drop, left foot: Secondary | ICD-10-CM | POA: Diagnosis not present

## 2019-05-26 DIAGNOSIS — Z03818 Encounter for observation for suspected exposure to other biological agents ruled out: Secondary | ICD-10-CM | POA: Diagnosis not present

## 2019-06-02 DIAGNOSIS — Z23 Encounter for immunization: Secondary | ICD-10-CM | POA: Diagnosis not present

## 2019-06-02 DIAGNOSIS — Z03818 Encounter for observation for suspected exposure to other biological agents ruled out: Secondary | ICD-10-CM | POA: Diagnosis not present

## 2019-06-09 DIAGNOSIS — F0280 Dementia in other diseases classified elsewhere without behavioral disturbance: Secondary | ICD-10-CM | POA: Diagnosis not present

## 2019-06-09 DIAGNOSIS — E119 Type 2 diabetes mellitus without complications: Secondary | ICD-10-CM | POA: Diagnosis not present

## 2019-06-09 DIAGNOSIS — I10 Essential (primary) hypertension: Secondary | ICD-10-CM | POA: Diagnosis not present

## 2019-06-09 DIAGNOSIS — G301 Alzheimer's disease with late onset: Secondary | ICD-10-CM | POA: Diagnosis not present

## 2019-06-09 DIAGNOSIS — M2041 Other hammer toe(s) (acquired), right foot: Secondary | ICD-10-CM | POA: Diagnosis not present

## 2019-06-09 DIAGNOSIS — Z7984 Long term (current) use of oral hypoglycemic drugs: Secondary | ICD-10-CM | POA: Diagnosis not present

## 2019-06-09 DIAGNOSIS — E1140 Type 2 diabetes mellitus with diabetic neuropathy, unspecified: Secondary | ICD-10-CM | POA: Diagnosis not present

## 2019-06-09 DIAGNOSIS — I5032 Chronic diastolic (congestive) heart failure: Secondary | ICD-10-CM | POA: Diagnosis not present

## 2019-06-09 DIAGNOSIS — B351 Tinea unguium: Secondary | ICD-10-CM | POA: Diagnosis not present

## 2019-06-09 DIAGNOSIS — Z03818 Encounter for observation for suspected exposure to other biological agents ruled out: Secondary | ICD-10-CM | POA: Diagnosis not present

## 2019-06-15 DIAGNOSIS — R110 Nausea: Secondary | ICD-10-CM | POA: Diagnosis not present

## 2019-06-16 DIAGNOSIS — Z03818 Encounter for observation for suspected exposure to other biological agents ruled out: Secondary | ICD-10-CM | POA: Diagnosis not present

## 2019-06-16 DIAGNOSIS — R0789 Other chest pain: Secondary | ICD-10-CM | POA: Diagnosis not present

## 2019-06-16 DIAGNOSIS — R110 Nausea: Secondary | ICD-10-CM | POA: Diagnosis not present

## 2019-06-16 DIAGNOSIS — D649 Anemia, unspecified: Secondary | ICD-10-CM | POA: Diagnosis not present

## 2019-06-16 DIAGNOSIS — G301 Alzheimer's disease with late onset: Secondary | ICD-10-CM | POA: Diagnosis not present

## 2019-06-16 DIAGNOSIS — E039 Hypothyroidism, unspecified: Secondary | ICD-10-CM | POA: Diagnosis not present

## 2019-06-16 DIAGNOSIS — R109 Unspecified abdominal pain: Secondary | ICD-10-CM | POA: Diagnosis not present

## 2019-06-16 DIAGNOSIS — D6489 Other specified anemias: Secondary | ICD-10-CM | POA: Diagnosis not present

## 2019-06-16 DIAGNOSIS — R319 Hematuria, unspecified: Secondary | ICD-10-CM | POA: Diagnosis not present

## 2019-06-16 DIAGNOSIS — R079 Chest pain, unspecified: Secondary | ICD-10-CM | POA: Diagnosis not present

## 2019-06-16 DIAGNOSIS — K567 Ileus, unspecified: Secondary | ICD-10-CM | POA: Diagnosis not present

## 2019-06-16 DIAGNOSIS — N390 Urinary tract infection, site not specified: Secondary | ICD-10-CM | POA: Diagnosis not present

## 2019-06-23 DIAGNOSIS — Z03818 Encounter for observation for suspected exposure to other biological agents ruled out: Secondary | ICD-10-CM | POA: Diagnosis not present

## 2019-06-25 DIAGNOSIS — I110 Hypertensive heart disease with heart failure: Secondary | ICD-10-CM | POA: Diagnosis not present

## 2019-06-25 DIAGNOSIS — J431 Panlobular emphysema: Secondary | ICD-10-CM | POA: Diagnosis not present

## 2019-06-25 DIAGNOSIS — E119 Type 2 diabetes mellitus without complications: Secondary | ICD-10-CM | POA: Diagnosis not present

## 2019-06-25 DIAGNOSIS — I5032 Chronic diastolic (congestive) heart failure: Secondary | ICD-10-CM | POA: Diagnosis not present

## 2019-06-26 DIAGNOSIS — D649 Anemia, unspecified: Secondary | ICD-10-CM | POA: Diagnosis not present

## 2019-06-26 DIAGNOSIS — E559 Vitamin D deficiency, unspecified: Secondary | ICD-10-CM | POA: Diagnosis not present

## 2019-06-26 DIAGNOSIS — E119 Type 2 diabetes mellitus without complications: Secondary | ICD-10-CM | POA: Diagnosis not present

## 2019-06-26 DIAGNOSIS — E039 Hypothyroidism, unspecified: Secondary | ICD-10-CM | POA: Diagnosis not present

## 2019-06-29 DIAGNOSIS — I10 Essential (primary) hypertension: Secondary | ICD-10-CM | POA: Diagnosis not present

## 2019-06-29 DIAGNOSIS — E039 Hypothyroidism, unspecified: Secondary | ICD-10-CM | POA: Diagnosis not present

## 2019-06-29 DIAGNOSIS — E46 Unspecified protein-calorie malnutrition: Secondary | ICD-10-CM | POA: Diagnosis not present

## 2019-06-29 DIAGNOSIS — I5032 Chronic diastolic (congestive) heart failure: Secondary | ICD-10-CM | POA: Diagnosis not present

## 2019-06-30 DIAGNOSIS — Z23 Encounter for immunization: Secondary | ICD-10-CM | POA: Diagnosis not present

## 2019-06-30 DIAGNOSIS — Z03818 Encounter for observation for suspected exposure to other biological agents ruled out: Secondary | ICD-10-CM | POA: Diagnosis not present

## 2019-07-07 DIAGNOSIS — F0280 Dementia in other diseases classified elsewhere without behavioral disturbance: Secondary | ICD-10-CM | POA: Diagnosis not present

## 2019-07-07 DIAGNOSIS — G301 Alzheimer's disease with late onset: Secondary | ICD-10-CM | POA: Diagnosis not present

## 2019-07-07 DIAGNOSIS — E119 Type 2 diabetes mellitus without complications: Secondary | ICD-10-CM | POA: Diagnosis not present

## 2019-07-07 DIAGNOSIS — I10 Essential (primary) hypertension: Secondary | ICD-10-CM | POA: Diagnosis not present

## 2019-07-08 DIAGNOSIS — D170 Benign lipomatous neoplasm of skin and subcutaneous tissue of head, face and neck: Secondary | ICD-10-CM | POA: Diagnosis not present

## 2019-07-08 DIAGNOSIS — J343 Hypertrophy of nasal turbinates: Secondary | ICD-10-CM | POA: Diagnosis not present

## 2019-07-08 DIAGNOSIS — M270 Developmental disorders of jaws: Secondary | ICD-10-CM | POA: Diagnosis not present

## 2019-07-08 DIAGNOSIS — R221 Localized swelling, mass and lump, neck: Secondary | ICD-10-CM | POA: Diagnosis not present

## 2019-07-08 DIAGNOSIS — J342 Deviated nasal septum: Secondary | ICD-10-CM | POA: Diagnosis not present

## 2019-07-08 DIAGNOSIS — Z79899 Other long term (current) drug therapy: Secondary | ICD-10-CM | POA: Diagnosis not present

## 2019-07-14 DIAGNOSIS — Z03818 Encounter for observation for suspected exposure to other biological agents ruled out: Secondary | ICD-10-CM | POA: Diagnosis not present

## 2019-07-21 DIAGNOSIS — R293 Abnormal posture: Secondary | ICD-10-CM | POA: Diagnosis not present

## 2019-07-21 DIAGNOSIS — R1312 Dysphagia, oropharyngeal phase: Secondary | ICD-10-CM | POA: Diagnosis not present

## 2019-07-21 DIAGNOSIS — K115 Sialolithiasis: Secondary | ICD-10-CM | POA: Diagnosis not present

## 2019-07-21 DIAGNOSIS — J441 Chronic obstructive pulmonary disease with (acute) exacerbation: Secondary | ICD-10-CM | POA: Diagnosis not present

## 2019-07-21 DIAGNOSIS — Z03818 Encounter for observation for suspected exposure to other biological agents ruled out: Secondary | ICD-10-CM | POA: Diagnosis not present

## 2019-07-21 DIAGNOSIS — M8580 Other specified disorders of bone density and structure, unspecified site: Secondary | ICD-10-CM | POA: Diagnosis not present

## 2019-07-21 DIAGNOSIS — M4626 Osteomyelitis of vertebra, lumbar region: Secondary | ICD-10-CM | POA: Diagnosis not present

## 2019-07-21 DIAGNOSIS — R262 Difficulty in walking, not elsewhere classified: Secondary | ICD-10-CM | POA: Diagnosis not present

## 2019-07-21 DIAGNOSIS — E119 Type 2 diabetes mellitus without complications: Secondary | ICD-10-CM | POA: Diagnosis not present

## 2019-07-21 DIAGNOSIS — N132 Hydronephrosis with renal and ureteral calculous obstruction: Secondary | ICD-10-CM | POA: Diagnosis not present

## 2019-07-21 DIAGNOSIS — J9621 Acute and chronic respiratory failure with hypoxia: Secondary | ICD-10-CM | POA: Diagnosis not present

## 2019-07-21 DIAGNOSIS — E039 Hypothyroidism, unspecified: Secondary | ICD-10-CM | POA: Diagnosis not present

## 2019-07-21 DIAGNOSIS — M6281 Muscle weakness (generalized): Secondary | ICD-10-CM | POA: Diagnosis not present

## 2019-07-21 DIAGNOSIS — R41841 Cognitive communication deficit: Secondary | ICD-10-CM | POA: Diagnosis not present

## 2019-07-21 DIAGNOSIS — I10 Essential (primary) hypertension: Secondary | ICD-10-CM | POA: Diagnosis not present

## 2019-07-21 DIAGNOSIS — I5032 Chronic diastolic (congestive) heart failure: Secondary | ICD-10-CM | POA: Diagnosis not present

## 2019-07-21 DIAGNOSIS — I2609 Other pulmonary embolism with acute cor pulmonale: Secondary | ICD-10-CM | POA: Diagnosis not present

## 2019-07-21 DIAGNOSIS — R221 Localized swelling, mass and lump, neck: Secondary | ICD-10-CM | POA: Diagnosis not present

## 2019-07-21 DIAGNOSIS — M21371 Foot drop, right foot: Secondary | ICD-10-CM | POA: Diagnosis not present

## 2019-07-21 DIAGNOSIS — M21372 Foot drop, left foot: Secondary | ICD-10-CM | POA: Diagnosis not present

## 2019-07-21 DIAGNOSIS — D170 Benign lipomatous neoplasm of skin and subcutaneous tissue of head, face and neck: Secondary | ICD-10-CM | POA: Diagnosis not present

## 2019-07-22 DIAGNOSIS — E039 Hypothyroidism, unspecified: Secondary | ICD-10-CM | POA: Diagnosis not present

## 2019-07-22 DIAGNOSIS — E119 Type 2 diabetes mellitus without complications: Secondary | ICD-10-CM | POA: Diagnosis not present

## 2019-07-22 DIAGNOSIS — J9621 Acute and chronic respiratory failure with hypoxia: Secondary | ICD-10-CM | POA: Diagnosis not present

## 2019-07-22 DIAGNOSIS — J441 Chronic obstructive pulmonary disease with (acute) exacerbation: Secondary | ICD-10-CM | POA: Diagnosis not present

## 2019-07-22 DIAGNOSIS — I10 Essential (primary) hypertension: Secondary | ICD-10-CM | POA: Diagnosis not present

## 2019-07-22 DIAGNOSIS — M4626 Osteomyelitis of vertebra, lumbar region: Secondary | ICD-10-CM | POA: Diagnosis not present

## 2019-07-23 DIAGNOSIS — M4626 Osteomyelitis of vertebra, lumbar region: Secondary | ICD-10-CM | POA: Diagnosis not present

## 2019-07-23 DIAGNOSIS — I10 Essential (primary) hypertension: Secondary | ICD-10-CM | POA: Diagnosis not present

## 2019-07-23 DIAGNOSIS — E039 Hypothyroidism, unspecified: Secondary | ICD-10-CM | POA: Diagnosis not present

## 2019-07-23 DIAGNOSIS — J9621 Acute and chronic respiratory failure with hypoxia: Secondary | ICD-10-CM | POA: Diagnosis not present

## 2019-07-23 DIAGNOSIS — E119 Type 2 diabetes mellitus without complications: Secondary | ICD-10-CM | POA: Diagnosis not present

## 2019-07-23 DIAGNOSIS — I110 Hypertensive heart disease with heart failure: Secondary | ICD-10-CM | POA: Diagnosis not present

## 2019-07-23 DIAGNOSIS — J441 Chronic obstructive pulmonary disease with (acute) exacerbation: Secondary | ICD-10-CM | POA: Diagnosis not present

## 2019-07-23 DIAGNOSIS — I5032 Chronic diastolic (congestive) heart failure: Secondary | ICD-10-CM | POA: Diagnosis not present

## 2019-07-23 DIAGNOSIS — J431 Panlobular emphysema: Secondary | ICD-10-CM | POA: Diagnosis not present

## 2019-07-24 DIAGNOSIS — E119 Type 2 diabetes mellitus without complications: Secondary | ICD-10-CM | POA: Diagnosis not present

## 2019-07-24 DIAGNOSIS — J441 Chronic obstructive pulmonary disease with (acute) exacerbation: Secondary | ICD-10-CM | POA: Diagnosis not present

## 2019-07-24 DIAGNOSIS — J431 Panlobular emphysema: Secondary | ICD-10-CM | POA: Diagnosis not present

## 2019-07-24 DIAGNOSIS — M4626 Osteomyelitis of vertebra, lumbar region: Secondary | ICD-10-CM | POA: Diagnosis not present

## 2019-07-24 DIAGNOSIS — E039 Hypothyroidism, unspecified: Secondary | ICD-10-CM | POA: Diagnosis not present

## 2019-07-24 DIAGNOSIS — J9621 Acute and chronic respiratory failure with hypoxia: Secondary | ICD-10-CM | POA: Diagnosis not present

## 2019-07-24 DIAGNOSIS — R7881 Bacteremia: Secondary | ICD-10-CM | POA: Diagnosis not present

## 2019-07-24 DIAGNOSIS — I10 Essential (primary) hypertension: Secondary | ICD-10-CM | POA: Diagnosis not present

## 2019-07-25 DIAGNOSIS — J9621 Acute and chronic respiratory failure with hypoxia: Secondary | ICD-10-CM | POA: Diagnosis not present

## 2019-07-25 DIAGNOSIS — I10 Essential (primary) hypertension: Secondary | ICD-10-CM | POA: Diagnosis not present

## 2019-07-25 DIAGNOSIS — J441 Chronic obstructive pulmonary disease with (acute) exacerbation: Secondary | ICD-10-CM | POA: Diagnosis not present

## 2019-07-25 DIAGNOSIS — E039 Hypothyroidism, unspecified: Secondary | ICD-10-CM | POA: Diagnosis not present

## 2019-07-25 DIAGNOSIS — M4626 Osteomyelitis of vertebra, lumbar region: Secondary | ICD-10-CM | POA: Diagnosis not present

## 2019-07-25 DIAGNOSIS — E119 Type 2 diabetes mellitus without complications: Secondary | ICD-10-CM | POA: Diagnosis not present

## 2019-07-28 DIAGNOSIS — J9621 Acute and chronic respiratory failure with hypoxia: Secondary | ICD-10-CM | POA: Diagnosis not present

## 2019-07-28 DIAGNOSIS — J441 Chronic obstructive pulmonary disease with (acute) exacerbation: Secondary | ICD-10-CM | POA: Diagnosis not present

## 2019-07-28 DIAGNOSIS — E039 Hypothyroidism, unspecified: Secondary | ICD-10-CM | POA: Diagnosis not present

## 2019-07-28 DIAGNOSIS — Z03818 Encounter for observation for suspected exposure to other biological agents ruled out: Secondary | ICD-10-CM | POA: Diagnosis not present

## 2019-07-28 DIAGNOSIS — M4626 Osteomyelitis of vertebra, lumbar region: Secondary | ICD-10-CM | POA: Diagnosis not present

## 2019-07-28 DIAGNOSIS — I10 Essential (primary) hypertension: Secondary | ICD-10-CM | POA: Diagnosis not present

## 2019-07-28 DIAGNOSIS — E119 Type 2 diabetes mellitus without complications: Secondary | ICD-10-CM | POA: Diagnosis not present

## 2019-07-29 DIAGNOSIS — E039 Hypothyroidism, unspecified: Secondary | ICD-10-CM | POA: Diagnosis not present

## 2019-07-29 DIAGNOSIS — I10 Essential (primary) hypertension: Secondary | ICD-10-CM | POA: Diagnosis not present

## 2019-07-29 DIAGNOSIS — E119 Type 2 diabetes mellitus without complications: Secondary | ICD-10-CM | POA: Diagnosis not present

## 2019-07-29 DIAGNOSIS — M4626 Osteomyelitis of vertebra, lumbar region: Secondary | ICD-10-CM | POA: Diagnosis not present

## 2019-07-29 DIAGNOSIS — J441 Chronic obstructive pulmonary disease with (acute) exacerbation: Secondary | ICD-10-CM | POA: Diagnosis not present

## 2019-07-29 DIAGNOSIS — J9621 Acute and chronic respiratory failure with hypoxia: Secondary | ICD-10-CM | POA: Diagnosis not present

## 2019-07-30 DIAGNOSIS — J441 Chronic obstructive pulmonary disease with (acute) exacerbation: Secondary | ICD-10-CM | POA: Diagnosis not present

## 2019-07-30 DIAGNOSIS — J9621 Acute and chronic respiratory failure with hypoxia: Secondary | ICD-10-CM | POA: Diagnosis not present

## 2019-07-30 DIAGNOSIS — E119 Type 2 diabetes mellitus without complications: Secondary | ICD-10-CM | POA: Diagnosis not present

## 2019-07-30 DIAGNOSIS — I10 Essential (primary) hypertension: Secondary | ICD-10-CM | POA: Diagnosis not present

## 2019-07-30 DIAGNOSIS — G301 Alzheimer's disease with late onset: Secondary | ICD-10-CM | POA: Diagnosis not present

## 2019-07-30 DIAGNOSIS — F0280 Dementia in other diseases classified elsewhere without behavioral disturbance: Secondary | ICD-10-CM | POA: Diagnosis not present

## 2019-07-30 DIAGNOSIS — E039 Hypothyroidism, unspecified: Secondary | ICD-10-CM | POA: Diagnosis not present

## 2019-07-30 DIAGNOSIS — M4626 Osteomyelitis of vertebra, lumbar region: Secondary | ICD-10-CM | POA: Diagnosis not present

## 2019-07-31 DIAGNOSIS — J441 Chronic obstructive pulmonary disease with (acute) exacerbation: Secondary | ICD-10-CM | POA: Diagnosis not present

## 2019-07-31 DIAGNOSIS — E119 Type 2 diabetes mellitus without complications: Secondary | ICD-10-CM | POA: Diagnosis not present

## 2019-07-31 DIAGNOSIS — E039 Hypothyroidism, unspecified: Secondary | ICD-10-CM | POA: Diagnosis not present

## 2019-07-31 DIAGNOSIS — J9621 Acute and chronic respiratory failure with hypoxia: Secondary | ICD-10-CM | POA: Diagnosis not present

## 2019-07-31 DIAGNOSIS — M4626 Osteomyelitis of vertebra, lumbar region: Secondary | ICD-10-CM | POA: Diagnosis not present

## 2019-07-31 DIAGNOSIS — I10 Essential (primary) hypertension: Secondary | ICD-10-CM | POA: Diagnosis not present

## 2019-08-03 DIAGNOSIS — M4626 Osteomyelitis of vertebra, lumbar region: Secondary | ICD-10-CM | POA: Diagnosis not present

## 2019-08-03 DIAGNOSIS — E039 Hypothyroidism, unspecified: Secondary | ICD-10-CM | POA: Diagnosis not present

## 2019-08-03 DIAGNOSIS — J441 Chronic obstructive pulmonary disease with (acute) exacerbation: Secondary | ICD-10-CM | POA: Diagnosis not present

## 2019-08-03 DIAGNOSIS — E119 Type 2 diabetes mellitus without complications: Secondary | ICD-10-CM | POA: Diagnosis not present

## 2019-08-03 DIAGNOSIS — J9621 Acute and chronic respiratory failure with hypoxia: Secondary | ICD-10-CM | POA: Diagnosis not present

## 2019-08-03 DIAGNOSIS — I10 Essential (primary) hypertension: Secondary | ICD-10-CM | POA: Diagnosis not present

## 2019-08-04 DIAGNOSIS — J9621 Acute and chronic respiratory failure with hypoxia: Secondary | ICD-10-CM | POA: Diagnosis not present

## 2019-08-04 DIAGNOSIS — M4626 Osteomyelitis of vertebra, lumbar region: Secondary | ICD-10-CM | POA: Diagnosis not present

## 2019-08-04 DIAGNOSIS — E039 Hypothyroidism, unspecified: Secondary | ICD-10-CM | POA: Diagnosis not present

## 2019-08-04 DIAGNOSIS — Z03818 Encounter for observation for suspected exposure to other biological agents ruled out: Secondary | ICD-10-CM | POA: Diagnosis not present

## 2019-08-04 DIAGNOSIS — J342 Deviated nasal septum: Secondary | ICD-10-CM | POA: Diagnosis not present

## 2019-08-04 DIAGNOSIS — I10 Essential (primary) hypertension: Secondary | ICD-10-CM | POA: Diagnosis not present

## 2019-08-04 DIAGNOSIS — R221 Localized swelling, mass and lump, neck: Secondary | ICD-10-CM | POA: Diagnosis not present

## 2019-08-04 DIAGNOSIS — E119 Type 2 diabetes mellitus without complications: Secondary | ICD-10-CM | POA: Diagnosis not present

## 2019-08-04 DIAGNOSIS — L729 Follicular cyst of the skin and subcutaneous tissue, unspecified: Secondary | ICD-10-CM | POA: Diagnosis not present

## 2019-08-04 DIAGNOSIS — J441 Chronic obstructive pulmonary disease with (acute) exacerbation: Secondary | ICD-10-CM | POA: Diagnosis not present

## 2019-08-05 DIAGNOSIS — E039 Hypothyroidism, unspecified: Secondary | ICD-10-CM | POA: Diagnosis not present

## 2019-08-05 DIAGNOSIS — M4626 Osteomyelitis of vertebra, lumbar region: Secondary | ICD-10-CM | POA: Diagnosis not present

## 2019-08-05 DIAGNOSIS — I10 Essential (primary) hypertension: Secondary | ICD-10-CM | POA: Diagnosis not present

## 2019-08-05 DIAGNOSIS — J9621 Acute and chronic respiratory failure with hypoxia: Secondary | ICD-10-CM | POA: Diagnosis not present

## 2019-08-05 DIAGNOSIS — J441 Chronic obstructive pulmonary disease with (acute) exacerbation: Secondary | ICD-10-CM | POA: Diagnosis not present

## 2019-08-05 DIAGNOSIS — E119 Type 2 diabetes mellitus without complications: Secondary | ICD-10-CM | POA: Diagnosis not present

## 2019-08-06 DIAGNOSIS — I10 Essential (primary) hypertension: Secondary | ICD-10-CM | POA: Diagnosis not present

## 2019-08-06 DIAGNOSIS — M4626 Osteomyelitis of vertebra, lumbar region: Secondary | ICD-10-CM | POA: Diagnosis not present

## 2019-08-06 DIAGNOSIS — J9621 Acute and chronic respiratory failure with hypoxia: Secondary | ICD-10-CM | POA: Diagnosis not present

## 2019-08-06 DIAGNOSIS — J441 Chronic obstructive pulmonary disease with (acute) exacerbation: Secondary | ICD-10-CM | POA: Diagnosis not present

## 2019-08-06 DIAGNOSIS — E119 Type 2 diabetes mellitus without complications: Secondary | ICD-10-CM | POA: Diagnosis not present

## 2019-08-06 DIAGNOSIS — E039 Hypothyroidism, unspecified: Secondary | ICD-10-CM | POA: Diagnosis not present

## 2019-08-07 DIAGNOSIS — J441 Chronic obstructive pulmonary disease with (acute) exacerbation: Secondary | ICD-10-CM | POA: Diagnosis not present

## 2019-08-07 DIAGNOSIS — E119 Type 2 diabetes mellitus without complications: Secondary | ICD-10-CM | POA: Diagnosis not present

## 2019-08-07 DIAGNOSIS — I10 Essential (primary) hypertension: Secondary | ICD-10-CM | POA: Diagnosis not present

## 2019-08-07 DIAGNOSIS — E039 Hypothyroidism, unspecified: Secondary | ICD-10-CM | POA: Diagnosis not present

## 2019-08-07 DIAGNOSIS — J9621 Acute and chronic respiratory failure with hypoxia: Secondary | ICD-10-CM | POA: Diagnosis not present

## 2019-08-07 DIAGNOSIS — M4626 Osteomyelitis of vertebra, lumbar region: Secondary | ICD-10-CM | POA: Diagnosis not present

## 2019-08-08 DIAGNOSIS — J9621 Acute and chronic respiratory failure with hypoxia: Secondary | ICD-10-CM | POA: Diagnosis not present

## 2019-08-08 DIAGNOSIS — I10 Essential (primary) hypertension: Secondary | ICD-10-CM | POA: Diagnosis not present

## 2019-08-08 DIAGNOSIS — J441 Chronic obstructive pulmonary disease with (acute) exacerbation: Secondary | ICD-10-CM | POA: Diagnosis not present

## 2019-08-08 DIAGNOSIS — M4626 Osteomyelitis of vertebra, lumbar region: Secondary | ICD-10-CM | POA: Diagnosis not present

## 2019-08-08 DIAGNOSIS — E119 Type 2 diabetes mellitus without complications: Secondary | ICD-10-CM | POA: Diagnosis not present

## 2019-08-08 DIAGNOSIS — E039 Hypothyroidism, unspecified: Secondary | ICD-10-CM | POA: Diagnosis not present

## 2019-08-09 DIAGNOSIS — J441 Chronic obstructive pulmonary disease with (acute) exacerbation: Secondary | ICD-10-CM | POA: Diagnosis not present

## 2019-08-09 DIAGNOSIS — E039 Hypothyroidism, unspecified: Secondary | ICD-10-CM | POA: Diagnosis not present

## 2019-08-09 DIAGNOSIS — J9621 Acute and chronic respiratory failure with hypoxia: Secondary | ICD-10-CM | POA: Diagnosis not present

## 2019-08-09 DIAGNOSIS — I10 Essential (primary) hypertension: Secondary | ICD-10-CM | POA: Diagnosis not present

## 2019-08-09 DIAGNOSIS — E119 Type 2 diabetes mellitus without complications: Secondary | ICD-10-CM | POA: Diagnosis not present

## 2019-08-09 DIAGNOSIS — M4626 Osteomyelitis of vertebra, lumbar region: Secondary | ICD-10-CM | POA: Diagnosis not present

## 2019-08-11 DIAGNOSIS — J9621 Acute and chronic respiratory failure with hypoxia: Secondary | ICD-10-CM | POA: Diagnosis not present

## 2019-08-11 DIAGNOSIS — I10 Essential (primary) hypertension: Secondary | ICD-10-CM | POA: Diagnosis not present

## 2019-08-11 DIAGNOSIS — E119 Type 2 diabetes mellitus without complications: Secondary | ICD-10-CM | POA: Diagnosis not present

## 2019-08-11 DIAGNOSIS — J441 Chronic obstructive pulmonary disease with (acute) exacerbation: Secondary | ICD-10-CM | POA: Diagnosis not present

## 2019-08-11 DIAGNOSIS — M4626 Osteomyelitis of vertebra, lumbar region: Secondary | ICD-10-CM | POA: Diagnosis not present

## 2019-08-11 DIAGNOSIS — E039 Hypothyroidism, unspecified: Secondary | ICD-10-CM | POA: Diagnosis not present

## 2019-08-11 DIAGNOSIS — Z03818 Encounter for observation for suspected exposure to other biological agents ruled out: Secondary | ICD-10-CM | POA: Diagnosis not present

## 2019-08-12 DIAGNOSIS — I10 Essential (primary) hypertension: Secondary | ICD-10-CM | POA: Diagnosis not present

## 2019-08-12 DIAGNOSIS — E119 Type 2 diabetes mellitus without complications: Secondary | ICD-10-CM | POA: Diagnosis not present

## 2019-08-12 DIAGNOSIS — E039 Hypothyroidism, unspecified: Secondary | ICD-10-CM | POA: Diagnosis not present

## 2019-08-12 DIAGNOSIS — J9621 Acute and chronic respiratory failure with hypoxia: Secondary | ICD-10-CM | POA: Diagnosis not present

## 2019-08-12 DIAGNOSIS — J441 Chronic obstructive pulmonary disease with (acute) exacerbation: Secondary | ICD-10-CM | POA: Diagnosis not present

## 2019-08-12 DIAGNOSIS — M4626 Osteomyelitis of vertebra, lumbar region: Secondary | ICD-10-CM | POA: Diagnosis not present

## 2019-08-13 DIAGNOSIS — J441 Chronic obstructive pulmonary disease with (acute) exacerbation: Secondary | ICD-10-CM | POA: Diagnosis not present

## 2019-08-13 DIAGNOSIS — J9621 Acute and chronic respiratory failure with hypoxia: Secondary | ICD-10-CM | POA: Diagnosis not present

## 2019-08-13 DIAGNOSIS — E119 Type 2 diabetes mellitus without complications: Secondary | ICD-10-CM | POA: Diagnosis not present

## 2019-08-13 DIAGNOSIS — E039 Hypothyroidism, unspecified: Secondary | ICD-10-CM | POA: Diagnosis not present

## 2019-08-13 DIAGNOSIS — M4626 Osteomyelitis of vertebra, lumbar region: Secondary | ICD-10-CM | POA: Diagnosis not present

## 2019-08-13 DIAGNOSIS — I10 Essential (primary) hypertension: Secondary | ICD-10-CM | POA: Diagnosis not present

## 2019-08-14 DIAGNOSIS — I10 Essential (primary) hypertension: Secondary | ICD-10-CM | POA: Diagnosis not present

## 2019-08-14 DIAGNOSIS — E039 Hypothyroidism, unspecified: Secondary | ICD-10-CM | POA: Diagnosis not present

## 2019-08-14 DIAGNOSIS — J441 Chronic obstructive pulmonary disease with (acute) exacerbation: Secondary | ICD-10-CM | POA: Diagnosis not present

## 2019-08-14 DIAGNOSIS — J9621 Acute and chronic respiratory failure with hypoxia: Secondary | ICD-10-CM | POA: Diagnosis not present

## 2019-08-14 DIAGNOSIS — M4626 Osteomyelitis of vertebra, lumbar region: Secondary | ICD-10-CM | POA: Diagnosis not present

## 2019-08-14 DIAGNOSIS — E119 Type 2 diabetes mellitus without complications: Secondary | ICD-10-CM | POA: Diagnosis not present

## 2019-08-15 DIAGNOSIS — E119 Type 2 diabetes mellitus without complications: Secondary | ICD-10-CM | POA: Diagnosis not present

## 2019-08-15 DIAGNOSIS — E039 Hypothyroidism, unspecified: Secondary | ICD-10-CM | POA: Diagnosis not present

## 2019-08-15 DIAGNOSIS — I10 Essential (primary) hypertension: Secondary | ICD-10-CM | POA: Diagnosis not present

## 2019-08-15 DIAGNOSIS — M4626 Osteomyelitis of vertebra, lumbar region: Secondary | ICD-10-CM | POA: Diagnosis not present

## 2019-08-15 DIAGNOSIS — J9621 Acute and chronic respiratory failure with hypoxia: Secondary | ICD-10-CM | POA: Diagnosis not present

## 2019-08-15 DIAGNOSIS — J441 Chronic obstructive pulmonary disease with (acute) exacerbation: Secondary | ICD-10-CM | POA: Diagnosis not present

## 2019-08-16 DIAGNOSIS — I10 Essential (primary) hypertension: Secondary | ICD-10-CM | POA: Diagnosis not present

## 2019-08-16 DIAGNOSIS — E039 Hypothyroidism, unspecified: Secondary | ICD-10-CM | POA: Diagnosis not present

## 2019-08-16 DIAGNOSIS — M4626 Osteomyelitis of vertebra, lumbar region: Secondary | ICD-10-CM | POA: Diagnosis not present

## 2019-08-16 DIAGNOSIS — J441 Chronic obstructive pulmonary disease with (acute) exacerbation: Secondary | ICD-10-CM | POA: Diagnosis not present

## 2019-08-16 DIAGNOSIS — E119 Type 2 diabetes mellitus without complications: Secondary | ICD-10-CM | POA: Diagnosis not present

## 2019-08-16 DIAGNOSIS — J9621 Acute and chronic respiratory failure with hypoxia: Secondary | ICD-10-CM | POA: Diagnosis not present

## 2019-08-17 DIAGNOSIS — I10 Essential (primary) hypertension: Secondary | ICD-10-CM | POA: Diagnosis not present

## 2019-08-17 DIAGNOSIS — J441 Chronic obstructive pulmonary disease with (acute) exacerbation: Secondary | ICD-10-CM | POA: Diagnosis not present

## 2019-08-17 DIAGNOSIS — J9621 Acute and chronic respiratory failure with hypoxia: Secondary | ICD-10-CM | POA: Diagnosis not present

## 2019-08-17 DIAGNOSIS — M4626 Osteomyelitis of vertebra, lumbar region: Secondary | ICD-10-CM | POA: Diagnosis not present

## 2019-08-17 DIAGNOSIS — E039 Hypothyroidism, unspecified: Secondary | ICD-10-CM | POA: Diagnosis not present

## 2019-08-17 DIAGNOSIS — E119 Type 2 diabetes mellitus without complications: Secondary | ICD-10-CM | POA: Diagnosis not present

## 2019-08-18 DIAGNOSIS — E119 Type 2 diabetes mellitus without complications: Secondary | ICD-10-CM | POA: Diagnosis not present

## 2019-08-18 DIAGNOSIS — Z03818 Encounter for observation for suspected exposure to other biological agents ruled out: Secondary | ICD-10-CM | POA: Diagnosis not present

## 2019-08-18 DIAGNOSIS — J9621 Acute and chronic respiratory failure with hypoxia: Secondary | ICD-10-CM | POA: Diagnosis not present

## 2019-08-18 DIAGNOSIS — I10 Essential (primary) hypertension: Secondary | ICD-10-CM | POA: Diagnosis not present

## 2019-08-18 DIAGNOSIS — E039 Hypothyroidism, unspecified: Secondary | ICD-10-CM | POA: Diagnosis not present

## 2019-08-18 DIAGNOSIS — M4626 Osteomyelitis of vertebra, lumbar region: Secondary | ICD-10-CM | POA: Diagnosis not present

## 2019-08-18 DIAGNOSIS — J441 Chronic obstructive pulmonary disease with (acute) exacerbation: Secondary | ICD-10-CM | POA: Diagnosis not present

## 2019-08-20 DIAGNOSIS — I5032 Chronic diastolic (congestive) heart failure: Secondary | ICD-10-CM | POA: Diagnosis not present

## 2019-08-20 DIAGNOSIS — J441 Chronic obstructive pulmonary disease with (acute) exacerbation: Secondary | ICD-10-CM | POA: Diagnosis not present

## 2019-08-20 DIAGNOSIS — I110 Hypertensive heart disease with heart failure: Secondary | ICD-10-CM | POA: Diagnosis not present

## 2019-08-20 DIAGNOSIS — J9621 Acute and chronic respiratory failure with hypoxia: Secondary | ICD-10-CM | POA: Diagnosis not present

## 2019-08-20 DIAGNOSIS — J431 Panlobular emphysema: Secondary | ICD-10-CM | POA: Diagnosis not present

## 2019-08-20 DIAGNOSIS — M4626 Osteomyelitis of vertebra, lumbar region: Secondary | ICD-10-CM | POA: Diagnosis not present

## 2019-08-20 DIAGNOSIS — E039 Hypothyroidism, unspecified: Secondary | ICD-10-CM | POA: Diagnosis not present

## 2019-08-20 DIAGNOSIS — I10 Essential (primary) hypertension: Secondary | ICD-10-CM | POA: Diagnosis not present

## 2019-08-20 DIAGNOSIS — E119 Type 2 diabetes mellitus without complications: Secondary | ICD-10-CM | POA: Diagnosis not present

## 2019-08-21 DIAGNOSIS — E119 Type 2 diabetes mellitus without complications: Secondary | ICD-10-CM | POA: Diagnosis not present

## 2019-08-21 DIAGNOSIS — M21372 Foot drop, left foot: Secondary | ICD-10-CM | POA: Diagnosis not present

## 2019-08-21 DIAGNOSIS — M4626 Osteomyelitis of vertebra, lumbar region: Secondary | ICD-10-CM | POA: Diagnosis not present

## 2019-08-21 DIAGNOSIS — R1312 Dysphagia, oropharyngeal phase: Secondary | ICD-10-CM | POA: Diagnosis not present

## 2019-08-21 DIAGNOSIS — R262 Difficulty in walking, not elsewhere classified: Secondary | ICD-10-CM | POA: Diagnosis not present

## 2019-08-21 DIAGNOSIS — R293 Abnormal posture: Secondary | ICD-10-CM | POA: Diagnosis not present

## 2019-08-21 DIAGNOSIS — J441 Chronic obstructive pulmonary disease with (acute) exacerbation: Secondary | ICD-10-CM | POA: Diagnosis not present

## 2019-08-21 DIAGNOSIS — R278 Other lack of coordination: Secondary | ICD-10-CM | POA: Diagnosis not present

## 2019-08-21 DIAGNOSIS — I10 Essential (primary) hypertension: Secondary | ICD-10-CM | POA: Diagnosis not present

## 2019-08-21 DIAGNOSIS — M8580 Other specified disorders of bone density and structure, unspecified site: Secondary | ICD-10-CM | POA: Diagnosis not present

## 2019-08-21 DIAGNOSIS — M21371 Foot drop, right foot: Secondary | ICD-10-CM | POA: Diagnosis not present

## 2019-08-21 DIAGNOSIS — I5032 Chronic diastolic (congestive) heart failure: Secondary | ICD-10-CM | POA: Diagnosis not present

## 2019-08-21 DIAGNOSIS — N132 Hydronephrosis with renal and ureteral calculous obstruction: Secondary | ICD-10-CM | POA: Diagnosis not present

## 2019-08-21 DIAGNOSIS — I2609 Other pulmonary embolism with acute cor pulmonale: Secondary | ICD-10-CM | POA: Diagnosis not present

## 2019-08-21 DIAGNOSIS — J9621 Acute and chronic respiratory failure with hypoxia: Secondary | ICD-10-CM | POA: Diagnosis not present

## 2019-08-21 DIAGNOSIS — R41841 Cognitive communication deficit: Secondary | ICD-10-CM | POA: Diagnosis not present

## 2019-08-21 DIAGNOSIS — M6281 Muscle weakness (generalized): Secondary | ICD-10-CM | POA: Diagnosis not present

## 2019-08-21 DIAGNOSIS — E039 Hypothyroidism, unspecified: Secondary | ICD-10-CM | POA: Diagnosis not present

## 2019-08-22 DIAGNOSIS — E119 Type 2 diabetes mellitus without complications: Secondary | ICD-10-CM | POA: Diagnosis not present

## 2019-08-22 DIAGNOSIS — E039 Hypothyroidism, unspecified: Secondary | ICD-10-CM | POA: Diagnosis not present

## 2019-08-22 DIAGNOSIS — M4626 Osteomyelitis of vertebra, lumbar region: Secondary | ICD-10-CM | POA: Diagnosis not present

## 2019-08-22 DIAGNOSIS — J9621 Acute and chronic respiratory failure with hypoxia: Secondary | ICD-10-CM | POA: Diagnosis not present

## 2019-08-22 DIAGNOSIS — J441 Chronic obstructive pulmonary disease with (acute) exacerbation: Secondary | ICD-10-CM | POA: Diagnosis not present

## 2019-08-22 DIAGNOSIS — I10 Essential (primary) hypertension: Secondary | ICD-10-CM | POA: Diagnosis not present

## 2019-08-23 DIAGNOSIS — E119 Type 2 diabetes mellitus without complications: Secondary | ICD-10-CM | POA: Diagnosis not present

## 2019-08-23 DIAGNOSIS — M4626 Osteomyelitis of vertebra, lumbar region: Secondary | ICD-10-CM | POA: Diagnosis not present

## 2019-08-23 DIAGNOSIS — E039 Hypothyroidism, unspecified: Secondary | ICD-10-CM | POA: Diagnosis not present

## 2019-08-23 DIAGNOSIS — I10 Essential (primary) hypertension: Secondary | ICD-10-CM | POA: Diagnosis not present

## 2019-08-23 DIAGNOSIS — J441 Chronic obstructive pulmonary disease with (acute) exacerbation: Secondary | ICD-10-CM | POA: Diagnosis not present

## 2019-08-23 DIAGNOSIS — J9621 Acute and chronic respiratory failure with hypoxia: Secondary | ICD-10-CM | POA: Diagnosis not present

## 2019-08-25 DIAGNOSIS — I10 Essential (primary) hypertension: Secondary | ICD-10-CM | POA: Diagnosis not present

## 2019-08-25 DIAGNOSIS — J9621 Acute and chronic respiratory failure with hypoxia: Secondary | ICD-10-CM | POA: Diagnosis not present

## 2019-08-25 DIAGNOSIS — E039 Hypothyroidism, unspecified: Secondary | ICD-10-CM | POA: Diagnosis not present

## 2019-08-25 DIAGNOSIS — Z03818 Encounter for observation for suspected exposure to other biological agents ruled out: Secondary | ICD-10-CM | POA: Diagnosis not present

## 2019-08-25 DIAGNOSIS — M4626 Osteomyelitis of vertebra, lumbar region: Secondary | ICD-10-CM | POA: Diagnosis not present

## 2019-08-25 DIAGNOSIS — J441 Chronic obstructive pulmonary disease with (acute) exacerbation: Secondary | ICD-10-CM | POA: Diagnosis not present

## 2019-08-25 DIAGNOSIS — E119 Type 2 diabetes mellitus without complications: Secondary | ICD-10-CM | POA: Diagnosis not present

## 2019-08-26 DIAGNOSIS — E119 Type 2 diabetes mellitus without complications: Secondary | ICD-10-CM | POA: Diagnosis not present

## 2019-08-26 DIAGNOSIS — I10 Essential (primary) hypertension: Secondary | ICD-10-CM | POA: Diagnosis not present

## 2019-08-26 DIAGNOSIS — E039 Hypothyroidism, unspecified: Secondary | ICD-10-CM | POA: Diagnosis not present

## 2019-08-26 DIAGNOSIS — J441 Chronic obstructive pulmonary disease with (acute) exacerbation: Secondary | ICD-10-CM | POA: Diagnosis not present

## 2019-08-26 DIAGNOSIS — M4626 Osteomyelitis of vertebra, lumbar region: Secondary | ICD-10-CM | POA: Diagnosis not present

## 2019-08-26 DIAGNOSIS — J9621 Acute and chronic respiratory failure with hypoxia: Secondary | ICD-10-CM | POA: Diagnosis not present

## 2019-08-27 DIAGNOSIS — E039 Hypothyroidism, unspecified: Secondary | ICD-10-CM | POA: Diagnosis not present

## 2019-08-27 DIAGNOSIS — J441 Chronic obstructive pulmonary disease with (acute) exacerbation: Secondary | ICD-10-CM | POA: Diagnosis not present

## 2019-08-27 DIAGNOSIS — J9621 Acute and chronic respiratory failure with hypoxia: Secondary | ICD-10-CM | POA: Diagnosis not present

## 2019-08-27 DIAGNOSIS — E119 Type 2 diabetes mellitus without complications: Secondary | ICD-10-CM | POA: Diagnosis not present

## 2019-08-27 DIAGNOSIS — M4626 Osteomyelitis of vertebra, lumbar region: Secondary | ICD-10-CM | POA: Diagnosis not present

## 2019-08-27 DIAGNOSIS — I10 Essential (primary) hypertension: Secondary | ICD-10-CM | POA: Diagnosis not present

## 2019-08-28 DIAGNOSIS — E039 Hypothyroidism, unspecified: Secondary | ICD-10-CM | POA: Diagnosis not present

## 2019-08-28 DIAGNOSIS — M4626 Osteomyelitis of vertebra, lumbar region: Secondary | ICD-10-CM | POA: Diagnosis not present

## 2019-08-28 DIAGNOSIS — F0280 Dementia in other diseases classified elsewhere without behavioral disturbance: Secondary | ICD-10-CM | POA: Diagnosis not present

## 2019-08-28 DIAGNOSIS — J9621 Acute and chronic respiratory failure with hypoxia: Secondary | ICD-10-CM | POA: Diagnosis not present

## 2019-08-28 DIAGNOSIS — G301 Alzheimer's disease with late onset: Secondary | ICD-10-CM | POA: Diagnosis not present

## 2019-08-28 DIAGNOSIS — E119 Type 2 diabetes mellitus without complications: Secondary | ICD-10-CM | POA: Diagnosis not present

## 2019-08-28 DIAGNOSIS — J441 Chronic obstructive pulmonary disease with (acute) exacerbation: Secondary | ICD-10-CM | POA: Diagnosis not present

## 2019-08-28 DIAGNOSIS — I10 Essential (primary) hypertension: Secondary | ICD-10-CM | POA: Diagnosis not present

## 2019-08-29 DIAGNOSIS — I10 Essential (primary) hypertension: Secondary | ICD-10-CM | POA: Diagnosis not present

## 2019-08-29 DIAGNOSIS — M4626 Osteomyelitis of vertebra, lumbar region: Secondary | ICD-10-CM | POA: Diagnosis not present

## 2019-08-29 DIAGNOSIS — J9621 Acute and chronic respiratory failure with hypoxia: Secondary | ICD-10-CM | POA: Diagnosis not present

## 2019-08-29 DIAGNOSIS — E119 Type 2 diabetes mellitus without complications: Secondary | ICD-10-CM | POA: Diagnosis not present

## 2019-08-29 DIAGNOSIS — J441 Chronic obstructive pulmonary disease with (acute) exacerbation: Secondary | ICD-10-CM | POA: Diagnosis not present

## 2019-08-29 DIAGNOSIS — E039 Hypothyroidism, unspecified: Secondary | ICD-10-CM | POA: Diagnosis not present

## 2019-08-31 DIAGNOSIS — I10 Essential (primary) hypertension: Secondary | ICD-10-CM | POA: Diagnosis not present

## 2019-08-31 DIAGNOSIS — E119 Type 2 diabetes mellitus without complications: Secondary | ICD-10-CM | POA: Diagnosis not present

## 2019-08-31 DIAGNOSIS — J441 Chronic obstructive pulmonary disease with (acute) exacerbation: Secondary | ICD-10-CM | POA: Diagnosis not present

## 2019-08-31 DIAGNOSIS — M4626 Osteomyelitis of vertebra, lumbar region: Secondary | ICD-10-CM | POA: Diagnosis not present

## 2019-08-31 DIAGNOSIS — E039 Hypothyroidism, unspecified: Secondary | ICD-10-CM | POA: Diagnosis not present

## 2019-08-31 DIAGNOSIS — J9621 Acute and chronic respiratory failure with hypoxia: Secondary | ICD-10-CM | POA: Diagnosis not present

## 2019-09-01 DIAGNOSIS — Z03818 Encounter for observation for suspected exposure to other biological agents ruled out: Secondary | ICD-10-CM | POA: Diagnosis not present

## 2019-09-03 DIAGNOSIS — L729 Follicular cyst of the skin and subcutaneous tissue, unspecified: Secondary | ICD-10-CM | POA: Diagnosis not present

## 2019-09-04 DIAGNOSIS — L729 Follicular cyst of the skin and subcutaneous tissue, unspecified: Secondary | ICD-10-CM | POA: Diagnosis not present

## 2019-09-04 DIAGNOSIS — L720 Epidermal cyst: Secondary | ICD-10-CM | POA: Diagnosis not present

## 2019-09-06 DIAGNOSIS — I10 Essential (primary) hypertension: Secondary | ICD-10-CM | POA: Diagnosis not present

## 2019-09-06 DIAGNOSIS — E46 Unspecified protein-calorie malnutrition: Secondary | ICD-10-CM | POA: Diagnosis not present

## 2019-09-06 DIAGNOSIS — I5032 Chronic diastolic (congestive) heart failure: Secondary | ICD-10-CM | POA: Diagnosis not present

## 2019-09-06 DIAGNOSIS — J431 Panlobular emphysema: Secondary | ICD-10-CM | POA: Diagnosis not present

## 2019-09-08 DIAGNOSIS — Z03818 Encounter for observation for suspected exposure to other biological agents ruled out: Secondary | ICD-10-CM | POA: Diagnosis not present

## 2019-09-09 DIAGNOSIS — L603 Nail dystrophy: Secondary | ICD-10-CM | POA: Diagnosis not present

## 2019-09-09 DIAGNOSIS — Z7984 Long term (current) use of oral hypoglycemic drugs: Secondary | ICD-10-CM | POA: Diagnosis not present

## 2019-09-09 DIAGNOSIS — E1151 Type 2 diabetes mellitus with diabetic peripheral angiopathy without gangrene: Secondary | ICD-10-CM | POA: Diagnosis not present

## 2019-09-09 DIAGNOSIS — E1140 Type 2 diabetes mellitus with diabetic neuropathy, unspecified: Secondary | ICD-10-CM | POA: Diagnosis not present

## 2019-09-09 DIAGNOSIS — B351 Tinea unguium: Secondary | ICD-10-CM | POA: Diagnosis not present

## 2019-09-15 DIAGNOSIS — Z03818 Encounter for observation for suspected exposure to other biological agents ruled out: Secondary | ICD-10-CM | POA: Diagnosis not present

## 2019-09-15 DIAGNOSIS — J343 Hypertrophy of nasal turbinates: Secondary | ICD-10-CM | POA: Diagnosis not present

## 2019-09-15 DIAGNOSIS — L729 Follicular cyst of the skin and subcutaneous tissue, unspecified: Secondary | ICD-10-CM | POA: Diagnosis not present

## 2019-09-15 DIAGNOSIS — J342 Deviated nasal septum: Secondary | ICD-10-CM | POA: Diagnosis not present

## 2019-09-17 DIAGNOSIS — E039 Hypothyroidism, unspecified: Secondary | ICD-10-CM | POA: Diagnosis not present

## 2019-09-17 DIAGNOSIS — E119 Type 2 diabetes mellitus without complications: Secondary | ICD-10-CM | POA: Diagnosis not present

## 2019-09-17 DIAGNOSIS — E559 Vitamin D deficiency, unspecified: Secondary | ICD-10-CM | POA: Diagnosis not present

## 2019-09-17 DIAGNOSIS — I110 Hypertensive heart disease with heart failure: Secondary | ICD-10-CM | POA: Diagnosis not present

## 2019-09-22 DIAGNOSIS — Z03818 Encounter for observation for suspected exposure to other biological agents ruled out: Secondary | ICD-10-CM | POA: Diagnosis not present

## 2019-09-29 DIAGNOSIS — Z03818 Encounter for observation for suspected exposure to other biological agents ruled out: Secondary | ICD-10-CM | POA: Diagnosis not present

## 2019-10-08 DIAGNOSIS — Z03818 Encounter for observation for suspected exposure to other biological agents ruled out: Secondary | ICD-10-CM | POA: Diagnosis not present

## 2019-10-15 DIAGNOSIS — G894 Chronic pain syndrome: Secondary | ICD-10-CM | POA: Diagnosis not present

## 2019-10-15 DIAGNOSIS — I5032 Chronic diastolic (congestive) heart failure: Secondary | ICD-10-CM | POA: Diagnosis not present

## 2019-10-15 DIAGNOSIS — I110 Hypertensive heart disease with heart failure: Secondary | ICD-10-CM | POA: Diagnosis not present

## 2019-10-15 DIAGNOSIS — E559 Vitamin D deficiency, unspecified: Secondary | ICD-10-CM | POA: Diagnosis not present

## 2019-10-17 DIAGNOSIS — E871 Hypo-osmolality and hyponatremia: Secondary | ICD-10-CM | POA: Diagnosis not present

## 2019-10-17 DIAGNOSIS — D649 Anemia, unspecified: Secondary | ICD-10-CM | POA: Diagnosis not present

## 2019-10-21 DIAGNOSIS — R7989 Other specified abnormal findings of blood chemistry: Secondary | ICD-10-CM | POA: Diagnosis not present

## 2019-10-21 DIAGNOSIS — D649 Anemia, unspecified: Secondary | ICD-10-CM | POA: Diagnosis not present

## 2019-10-28 DIAGNOSIS — Z03818 Encounter for observation for suspected exposure to other biological agents ruled out: Secondary | ICD-10-CM | POA: Diagnosis not present

## 2019-10-29 DIAGNOSIS — I10 Essential (primary) hypertension: Secondary | ICD-10-CM | POA: Diagnosis not present

## 2019-10-29 DIAGNOSIS — E039 Hypothyroidism, unspecified: Secondary | ICD-10-CM | POA: Diagnosis not present

## 2019-10-29 DIAGNOSIS — I110 Hypertensive heart disease with heart failure: Secondary | ICD-10-CM | POA: Diagnosis not present

## 2019-10-29 DIAGNOSIS — G301 Alzheimer's disease with late onset: Secondary | ICD-10-CM | POA: Diagnosis not present

## 2019-10-29 DIAGNOSIS — I5032 Chronic diastolic (congestive) heart failure: Secondary | ICD-10-CM | POA: Diagnosis not present

## 2019-10-29 DIAGNOSIS — E119 Type 2 diabetes mellitus without complications: Secondary | ICD-10-CM | POA: Diagnosis not present

## 2019-10-29 DIAGNOSIS — E559 Vitamin D deficiency, unspecified: Secondary | ICD-10-CM | POA: Diagnosis not present

## 2019-10-29 DIAGNOSIS — J431 Panlobular emphysema: Secondary | ICD-10-CM | POA: Diagnosis not present

## 2019-10-29 DIAGNOSIS — F0280 Dementia in other diseases classified elsewhere without behavioral disturbance: Secondary | ICD-10-CM | POA: Diagnosis not present

## 2019-10-31 DIAGNOSIS — E119 Type 2 diabetes mellitus without complications: Secondary | ICD-10-CM | POA: Diagnosis not present

## 2019-10-31 DIAGNOSIS — E559 Vitamin D deficiency, unspecified: Secondary | ICD-10-CM | POA: Diagnosis not present

## 2019-10-31 DIAGNOSIS — I10 Essential (primary) hypertension: Secondary | ICD-10-CM | POA: Diagnosis not present

## 2019-10-31 DIAGNOSIS — I110 Hypertensive heart disease with heart failure: Secondary | ICD-10-CM | POA: Diagnosis not present

## 2019-11-03 DIAGNOSIS — Z03818 Encounter for observation for suspected exposure to other biological agents ruled out: Secondary | ICD-10-CM | POA: Diagnosis not present

## 2019-11-24 DIAGNOSIS — E039 Hypothyroidism, unspecified: Secondary | ICD-10-CM | POA: Diagnosis not present

## 2019-11-24 DIAGNOSIS — E119 Type 2 diabetes mellitus without complications: Secondary | ICD-10-CM | POA: Diagnosis not present

## 2019-11-24 DIAGNOSIS — I110 Hypertensive heart disease with heart failure: Secondary | ICD-10-CM | POA: Diagnosis not present

## 2019-11-24 DIAGNOSIS — E871 Hypo-osmolality and hyponatremia: Secondary | ICD-10-CM | POA: Diagnosis not present

## 2019-11-27 DIAGNOSIS — E559 Vitamin D deficiency, unspecified: Secondary | ICD-10-CM | POA: Diagnosis not present

## 2019-11-27 DIAGNOSIS — F3341 Major depressive disorder, recurrent, in partial remission: Secondary | ICD-10-CM | POA: Diagnosis not present

## 2019-11-27 DIAGNOSIS — I5032 Chronic diastolic (congestive) heart failure: Secondary | ICD-10-CM | POA: Diagnosis not present

## 2019-11-27 DIAGNOSIS — I10 Essential (primary) hypertension: Secondary | ICD-10-CM | POA: Diagnosis not present

## 2019-11-27 DIAGNOSIS — I110 Hypertensive heart disease with heart failure: Secondary | ICD-10-CM | POA: Diagnosis not present

## 2019-11-27 DIAGNOSIS — J431 Panlobular emphysema: Secondary | ICD-10-CM | POA: Diagnosis not present

## 2019-11-27 DIAGNOSIS — G301 Alzheimer's disease with late onset: Secondary | ICD-10-CM | POA: Diagnosis not present

## 2019-11-27 DIAGNOSIS — F0280 Dementia in other diseases classified elsewhere without behavioral disturbance: Secondary | ICD-10-CM | POA: Diagnosis not present

## 2019-11-27 DIAGNOSIS — E039 Hypothyroidism, unspecified: Secondary | ICD-10-CM | POA: Diagnosis not present

## 2019-11-27 DIAGNOSIS — E119 Type 2 diabetes mellitus without complications: Secondary | ICD-10-CM | POA: Diagnosis not present

## 2019-12-05 DIAGNOSIS — J431 Panlobular emphysema: Secondary | ICD-10-CM | POA: Diagnosis not present

## 2019-12-05 DIAGNOSIS — G301 Alzheimer's disease with late onset: Secondary | ICD-10-CM | POA: Diagnosis not present

## 2019-12-05 DIAGNOSIS — I110 Hypertensive heart disease with heart failure: Secondary | ICD-10-CM | POA: Diagnosis not present

## 2019-12-05 DIAGNOSIS — M21371 Foot drop, right foot: Secondary | ICD-10-CM | POA: Diagnosis not present

## 2019-12-05 DIAGNOSIS — R262 Difficulty in walking, not elsewhere classified: Secondary | ICD-10-CM | POA: Diagnosis not present

## 2019-12-05 DIAGNOSIS — E039 Hypothyroidism, unspecified: Secondary | ICD-10-CM | POA: Diagnosis not present

## 2019-12-05 DIAGNOSIS — I2609 Other pulmonary embolism with acute cor pulmonale: Secondary | ICD-10-CM | POA: Diagnosis not present

## 2019-12-05 DIAGNOSIS — R41841 Cognitive communication deficit: Secondary | ICD-10-CM | POA: Diagnosis not present

## 2019-12-05 DIAGNOSIS — M21372 Foot drop, left foot: Secondary | ICD-10-CM | POA: Diagnosis not present

## 2019-12-05 DIAGNOSIS — I10 Essential (primary) hypertension: Secondary | ICD-10-CM | POA: Diagnosis not present

## 2019-12-05 DIAGNOSIS — R293 Abnormal posture: Secondary | ICD-10-CM | POA: Diagnosis not present

## 2019-12-05 DIAGNOSIS — J441 Chronic obstructive pulmonary disease with (acute) exacerbation: Secondary | ICD-10-CM | POA: Diagnosis not present

## 2019-12-05 DIAGNOSIS — G894 Chronic pain syndrome: Secondary | ICD-10-CM | POA: Diagnosis not present

## 2019-12-05 DIAGNOSIS — I5032 Chronic diastolic (congestive) heart failure: Secondary | ICD-10-CM | POA: Diagnosis not present

## 2019-12-05 DIAGNOSIS — F419 Anxiety disorder, unspecified: Secondary | ICD-10-CM | POA: Diagnosis not present

## 2019-12-05 DIAGNOSIS — N132 Hydronephrosis with renal and ureteral calculous obstruction: Secondary | ICD-10-CM | POA: Diagnosis not present

## 2019-12-05 DIAGNOSIS — M8580 Other specified disorders of bone density and structure, unspecified site: Secondary | ICD-10-CM | POA: Diagnosis not present

## 2019-12-05 DIAGNOSIS — M6281 Muscle weakness (generalized): Secondary | ICD-10-CM | POA: Diagnosis not present

## 2019-12-05 DIAGNOSIS — R278 Other lack of coordination: Secondary | ICD-10-CM | POA: Diagnosis not present

## 2019-12-05 DIAGNOSIS — F3341 Major depressive disorder, recurrent, in partial remission: Secondary | ICD-10-CM | POA: Diagnosis not present

## 2019-12-05 DIAGNOSIS — J9621 Acute and chronic respiratory failure with hypoxia: Secondary | ICD-10-CM | POA: Diagnosis not present

## 2019-12-05 DIAGNOSIS — E559 Vitamin D deficiency, unspecified: Secondary | ICD-10-CM | POA: Diagnosis not present

## 2019-12-05 DIAGNOSIS — M4626 Osteomyelitis of vertebra, lumbar region: Secondary | ICD-10-CM | POA: Diagnosis not present

## 2019-12-05 DIAGNOSIS — R1312 Dysphagia, oropharyngeal phase: Secondary | ICD-10-CM | POA: Diagnosis not present

## 2019-12-05 DIAGNOSIS — E119 Type 2 diabetes mellitus without complications: Secondary | ICD-10-CM | POA: Diagnosis not present

## 2019-12-08 DIAGNOSIS — E559 Vitamin D deficiency, unspecified: Secondary | ICD-10-CM | POA: Diagnosis not present

## 2019-12-08 DIAGNOSIS — E119 Type 2 diabetes mellitus without complications: Secondary | ICD-10-CM | POA: Diagnosis not present

## 2019-12-08 DIAGNOSIS — I110 Hypertensive heart disease with heart failure: Secondary | ICD-10-CM | POA: Diagnosis not present

## 2019-12-08 DIAGNOSIS — E039 Hypothyroidism, unspecified: Secondary | ICD-10-CM | POA: Diagnosis not present

## 2019-12-08 DIAGNOSIS — J431 Panlobular emphysema: Secondary | ICD-10-CM | POA: Diagnosis not present

## 2019-12-08 DIAGNOSIS — I5032 Chronic diastolic (congestive) heart failure: Secondary | ICD-10-CM | POA: Diagnosis not present

## 2019-12-09 DIAGNOSIS — E119 Type 2 diabetes mellitus without complications: Secondary | ICD-10-CM | POA: Diagnosis not present

## 2019-12-09 DIAGNOSIS — J431 Panlobular emphysema: Secondary | ICD-10-CM | POA: Diagnosis not present

## 2019-12-09 DIAGNOSIS — E039 Hypothyroidism, unspecified: Secondary | ICD-10-CM | POA: Diagnosis not present

## 2019-12-09 DIAGNOSIS — I5032 Chronic diastolic (congestive) heart failure: Secondary | ICD-10-CM | POA: Diagnosis not present

## 2019-12-09 DIAGNOSIS — E559 Vitamin D deficiency, unspecified: Secondary | ICD-10-CM | POA: Diagnosis not present

## 2019-12-09 DIAGNOSIS — I110 Hypertensive heart disease with heart failure: Secondary | ICD-10-CM | POA: Diagnosis not present

## 2019-12-10 DIAGNOSIS — E119 Type 2 diabetes mellitus without complications: Secondary | ICD-10-CM | POA: Diagnosis not present

## 2019-12-10 DIAGNOSIS — I110 Hypertensive heart disease with heart failure: Secondary | ICD-10-CM | POA: Diagnosis not present

## 2019-12-10 DIAGNOSIS — I5032 Chronic diastolic (congestive) heart failure: Secondary | ICD-10-CM | POA: Diagnosis not present

## 2019-12-10 DIAGNOSIS — E039 Hypothyroidism, unspecified: Secondary | ICD-10-CM | POA: Diagnosis not present

## 2019-12-10 DIAGNOSIS — E559 Vitamin D deficiency, unspecified: Secondary | ICD-10-CM | POA: Diagnosis not present

## 2019-12-10 DIAGNOSIS — J431 Panlobular emphysema: Secondary | ICD-10-CM | POA: Diagnosis not present

## 2019-12-11 DIAGNOSIS — E119 Type 2 diabetes mellitus without complications: Secondary | ICD-10-CM | POA: Diagnosis not present

## 2019-12-11 DIAGNOSIS — E559 Vitamin D deficiency, unspecified: Secondary | ICD-10-CM | POA: Diagnosis not present

## 2019-12-11 DIAGNOSIS — E039 Hypothyroidism, unspecified: Secondary | ICD-10-CM | POA: Diagnosis not present

## 2019-12-11 DIAGNOSIS — J431 Panlobular emphysema: Secondary | ICD-10-CM | POA: Diagnosis not present

## 2019-12-11 DIAGNOSIS — I110 Hypertensive heart disease with heart failure: Secondary | ICD-10-CM | POA: Diagnosis not present

## 2019-12-11 DIAGNOSIS — I5032 Chronic diastolic (congestive) heart failure: Secondary | ICD-10-CM | POA: Diagnosis not present

## 2019-12-12 DIAGNOSIS — I5032 Chronic diastolic (congestive) heart failure: Secondary | ICD-10-CM | POA: Diagnosis not present

## 2019-12-12 DIAGNOSIS — E559 Vitamin D deficiency, unspecified: Secondary | ICD-10-CM | POA: Diagnosis not present

## 2019-12-12 DIAGNOSIS — J431 Panlobular emphysema: Secondary | ICD-10-CM | POA: Diagnosis not present

## 2019-12-12 DIAGNOSIS — I110 Hypertensive heart disease with heart failure: Secondary | ICD-10-CM | POA: Diagnosis not present

## 2019-12-12 DIAGNOSIS — E039 Hypothyroidism, unspecified: Secondary | ICD-10-CM | POA: Diagnosis not present

## 2019-12-12 DIAGNOSIS — G894 Chronic pain syndrome: Secondary | ICD-10-CM | POA: Diagnosis not present

## 2019-12-12 DIAGNOSIS — E119 Type 2 diabetes mellitus without complications: Secondary | ICD-10-CM | POA: Diagnosis not present

## 2019-12-15 DIAGNOSIS — I5032 Chronic diastolic (congestive) heart failure: Secondary | ICD-10-CM | POA: Diagnosis not present

## 2019-12-15 DIAGNOSIS — J431 Panlobular emphysema: Secondary | ICD-10-CM | POA: Diagnosis not present

## 2019-12-15 DIAGNOSIS — E119 Type 2 diabetes mellitus without complications: Secondary | ICD-10-CM | POA: Diagnosis not present

## 2019-12-15 DIAGNOSIS — E559 Vitamin D deficiency, unspecified: Secondary | ICD-10-CM | POA: Diagnosis not present

## 2019-12-15 DIAGNOSIS — E039 Hypothyroidism, unspecified: Secondary | ICD-10-CM | POA: Diagnosis not present

## 2019-12-15 DIAGNOSIS — I110 Hypertensive heart disease with heart failure: Secondary | ICD-10-CM | POA: Diagnosis not present

## 2019-12-16 DIAGNOSIS — J431 Panlobular emphysema: Secondary | ICD-10-CM | POA: Diagnosis not present

## 2019-12-16 DIAGNOSIS — E119 Type 2 diabetes mellitus without complications: Secondary | ICD-10-CM | POA: Diagnosis not present

## 2019-12-16 DIAGNOSIS — E039 Hypothyroidism, unspecified: Secondary | ICD-10-CM | POA: Diagnosis not present

## 2019-12-16 DIAGNOSIS — I110 Hypertensive heart disease with heart failure: Secondary | ICD-10-CM | POA: Diagnosis not present

## 2019-12-16 DIAGNOSIS — I5032 Chronic diastolic (congestive) heart failure: Secondary | ICD-10-CM | POA: Diagnosis not present

## 2019-12-16 DIAGNOSIS — E559 Vitamin D deficiency, unspecified: Secondary | ICD-10-CM | POA: Diagnosis not present

## 2019-12-17 DIAGNOSIS — E559 Vitamin D deficiency, unspecified: Secondary | ICD-10-CM | POA: Diagnosis not present

## 2019-12-17 DIAGNOSIS — I5032 Chronic diastolic (congestive) heart failure: Secondary | ICD-10-CM | POA: Diagnosis not present

## 2019-12-17 DIAGNOSIS — E039 Hypothyroidism, unspecified: Secondary | ICD-10-CM | POA: Diagnosis not present

## 2019-12-17 DIAGNOSIS — I110 Hypertensive heart disease with heart failure: Secondary | ICD-10-CM | POA: Diagnosis not present

## 2019-12-17 DIAGNOSIS — J431 Panlobular emphysema: Secondary | ICD-10-CM | POA: Diagnosis not present

## 2019-12-17 DIAGNOSIS — E119 Type 2 diabetes mellitus without complications: Secondary | ICD-10-CM | POA: Diagnosis not present

## 2019-12-17 DIAGNOSIS — Z03818 Encounter for observation for suspected exposure to other biological agents ruled out: Secondary | ICD-10-CM | POA: Diagnosis not present

## 2019-12-18 DIAGNOSIS — I5032 Chronic diastolic (congestive) heart failure: Secondary | ICD-10-CM | POA: Diagnosis not present

## 2019-12-18 DIAGNOSIS — E039 Hypothyroidism, unspecified: Secondary | ICD-10-CM | POA: Diagnosis not present

## 2019-12-18 DIAGNOSIS — I110 Hypertensive heart disease with heart failure: Secondary | ICD-10-CM | POA: Diagnosis not present

## 2019-12-18 DIAGNOSIS — E119 Type 2 diabetes mellitus without complications: Secondary | ICD-10-CM | POA: Diagnosis not present

## 2019-12-18 DIAGNOSIS — J431 Panlobular emphysema: Secondary | ICD-10-CM | POA: Diagnosis not present

## 2019-12-18 DIAGNOSIS — E559 Vitamin D deficiency, unspecified: Secondary | ICD-10-CM | POA: Diagnosis not present

## 2019-12-19 DIAGNOSIS — E039 Hypothyroidism, unspecified: Secondary | ICD-10-CM | POA: Diagnosis not present

## 2019-12-19 DIAGNOSIS — E119 Type 2 diabetes mellitus without complications: Secondary | ICD-10-CM | POA: Diagnosis not present

## 2019-12-19 DIAGNOSIS — I110 Hypertensive heart disease with heart failure: Secondary | ICD-10-CM | POA: Diagnosis not present

## 2019-12-19 DIAGNOSIS — J431 Panlobular emphysema: Secondary | ICD-10-CM | POA: Diagnosis not present

## 2019-12-19 DIAGNOSIS — E559 Vitamin D deficiency, unspecified: Secondary | ICD-10-CM | POA: Diagnosis not present

## 2019-12-19 DIAGNOSIS — I5032 Chronic diastolic (congestive) heart failure: Secondary | ICD-10-CM | POA: Diagnosis not present

## 2019-12-22 DIAGNOSIS — M21371 Foot drop, right foot: Secondary | ICD-10-CM | POA: Diagnosis not present

## 2019-12-22 DIAGNOSIS — R262 Difficulty in walking, not elsewhere classified: Secondary | ICD-10-CM | POA: Diagnosis not present

## 2019-12-22 DIAGNOSIS — J431 Panlobular emphysema: Secondary | ICD-10-CM | POA: Diagnosis not present

## 2019-12-22 DIAGNOSIS — M21372 Foot drop, left foot: Secondary | ICD-10-CM | POA: Diagnosis not present

## 2019-12-22 DIAGNOSIS — J441 Chronic obstructive pulmonary disease with (acute) exacerbation: Secondary | ICD-10-CM | POA: Diagnosis not present

## 2019-12-22 DIAGNOSIS — G301 Alzheimer's disease with late onset: Secondary | ICD-10-CM | POA: Diagnosis not present

## 2019-12-22 DIAGNOSIS — R1311 Dysphagia, oral phase: Secondary | ICD-10-CM | POA: Diagnosis not present

## 2019-12-22 DIAGNOSIS — F3341 Major depressive disorder, recurrent, in partial remission: Secondary | ICD-10-CM | POA: Diagnosis not present

## 2019-12-22 DIAGNOSIS — R278 Other lack of coordination: Secondary | ICD-10-CM | POA: Diagnosis not present

## 2019-12-22 DIAGNOSIS — I2609 Other pulmonary embolism with acute cor pulmonale: Secondary | ICD-10-CM | POA: Diagnosis not present

## 2019-12-22 DIAGNOSIS — F419 Anxiety disorder, unspecified: Secondary | ICD-10-CM | POA: Diagnosis not present

## 2019-12-22 DIAGNOSIS — E039 Hypothyroidism, unspecified: Secondary | ICD-10-CM | POA: Diagnosis not present

## 2019-12-22 DIAGNOSIS — I5032 Chronic diastolic (congestive) heart failure: Secondary | ICD-10-CM | POA: Diagnosis not present

## 2019-12-22 DIAGNOSIS — G894 Chronic pain syndrome: Secondary | ICD-10-CM | POA: Diagnosis not present

## 2019-12-22 DIAGNOSIS — I110 Hypertensive heart disease with heart failure: Secondary | ICD-10-CM | POA: Diagnosis not present

## 2019-12-22 DIAGNOSIS — J9621 Acute and chronic respiratory failure with hypoxia: Secondary | ICD-10-CM | POA: Diagnosis not present

## 2019-12-22 DIAGNOSIS — E119 Type 2 diabetes mellitus without complications: Secondary | ICD-10-CM | POA: Diagnosis not present

## 2019-12-22 DIAGNOSIS — M4626 Osteomyelitis of vertebra, lumbar region: Secondary | ICD-10-CM | POA: Diagnosis not present

## 2019-12-22 DIAGNOSIS — R41841 Cognitive communication deficit: Secondary | ICD-10-CM | POA: Diagnosis not present

## 2019-12-22 DIAGNOSIS — R1312 Dysphagia, oropharyngeal phase: Secondary | ICD-10-CM | POA: Diagnosis not present

## 2019-12-22 DIAGNOSIS — R293 Abnormal posture: Secondary | ICD-10-CM | POA: Diagnosis not present

## 2019-12-22 DIAGNOSIS — M6281 Muscle weakness (generalized): Secondary | ICD-10-CM | POA: Diagnosis not present

## 2019-12-22 DIAGNOSIS — Z03818 Encounter for observation for suspected exposure to other biological agents ruled out: Secondary | ICD-10-CM | POA: Diagnosis not present

## 2019-12-22 DIAGNOSIS — M8580 Other specified disorders of bone density and structure, unspecified site: Secondary | ICD-10-CM | POA: Diagnosis not present

## 2019-12-22 DIAGNOSIS — E559 Vitamin D deficiency, unspecified: Secondary | ICD-10-CM | POA: Diagnosis not present

## 2019-12-22 DIAGNOSIS — I10 Essential (primary) hypertension: Secondary | ICD-10-CM | POA: Diagnosis not present

## 2019-12-23 DIAGNOSIS — J431 Panlobular emphysema: Secondary | ICD-10-CM | POA: Diagnosis not present

## 2019-12-23 DIAGNOSIS — I110 Hypertensive heart disease with heart failure: Secondary | ICD-10-CM | POA: Diagnosis not present

## 2019-12-23 DIAGNOSIS — I5032 Chronic diastolic (congestive) heart failure: Secondary | ICD-10-CM | POA: Diagnosis not present

## 2019-12-23 DIAGNOSIS — E039 Hypothyroidism, unspecified: Secondary | ICD-10-CM | POA: Diagnosis not present

## 2019-12-23 DIAGNOSIS — E119 Type 2 diabetes mellitus without complications: Secondary | ICD-10-CM | POA: Diagnosis not present

## 2019-12-23 DIAGNOSIS — E559 Vitamin D deficiency, unspecified: Secondary | ICD-10-CM | POA: Diagnosis not present

## 2019-12-24 DIAGNOSIS — I5032 Chronic diastolic (congestive) heart failure: Secondary | ICD-10-CM | POA: Diagnosis not present

## 2019-12-24 DIAGNOSIS — I110 Hypertensive heart disease with heart failure: Secondary | ICD-10-CM | POA: Diagnosis not present

## 2019-12-24 DIAGNOSIS — E039 Hypothyroidism, unspecified: Secondary | ICD-10-CM | POA: Diagnosis not present

## 2019-12-24 DIAGNOSIS — E559 Vitamin D deficiency, unspecified: Secondary | ICD-10-CM | POA: Diagnosis not present

## 2019-12-24 DIAGNOSIS — J431 Panlobular emphysema: Secondary | ICD-10-CM | POA: Diagnosis not present

## 2019-12-24 DIAGNOSIS — E119 Type 2 diabetes mellitus without complications: Secondary | ICD-10-CM | POA: Diagnosis not present

## 2019-12-25 DIAGNOSIS — I110 Hypertensive heart disease with heart failure: Secondary | ICD-10-CM | POA: Diagnosis not present

## 2019-12-25 DIAGNOSIS — E119 Type 2 diabetes mellitus without complications: Secondary | ICD-10-CM | POA: Diagnosis not present

## 2019-12-25 DIAGNOSIS — I5032 Chronic diastolic (congestive) heart failure: Secondary | ICD-10-CM | POA: Diagnosis not present

## 2019-12-25 DIAGNOSIS — J431 Panlobular emphysema: Secondary | ICD-10-CM | POA: Diagnosis not present

## 2019-12-25 DIAGNOSIS — E559 Vitamin D deficiency, unspecified: Secondary | ICD-10-CM | POA: Diagnosis not present

## 2019-12-25 DIAGNOSIS — E039 Hypothyroidism, unspecified: Secondary | ICD-10-CM | POA: Diagnosis not present

## 2019-12-26 DIAGNOSIS — I5032 Chronic diastolic (congestive) heart failure: Secondary | ICD-10-CM | POA: Diagnosis not present

## 2019-12-26 DIAGNOSIS — J431 Panlobular emphysema: Secondary | ICD-10-CM | POA: Diagnosis not present

## 2019-12-26 DIAGNOSIS — I110 Hypertensive heart disease with heart failure: Secondary | ICD-10-CM | POA: Diagnosis not present

## 2019-12-26 DIAGNOSIS — E559 Vitamin D deficiency, unspecified: Secondary | ICD-10-CM | POA: Diagnosis not present

## 2019-12-26 DIAGNOSIS — E119 Type 2 diabetes mellitus without complications: Secondary | ICD-10-CM | POA: Diagnosis not present

## 2019-12-26 DIAGNOSIS — E039 Hypothyroidism, unspecified: Secondary | ICD-10-CM | POA: Diagnosis not present

## 2019-12-27 DIAGNOSIS — E119 Type 2 diabetes mellitus without complications: Secondary | ICD-10-CM | POA: Diagnosis not present

## 2019-12-27 DIAGNOSIS — F3341 Major depressive disorder, recurrent, in partial remission: Secondary | ICD-10-CM | POA: Diagnosis not present

## 2019-12-27 DIAGNOSIS — J431 Panlobular emphysema: Secondary | ICD-10-CM | POA: Diagnosis not present

## 2019-12-27 DIAGNOSIS — I10 Essential (primary) hypertension: Secondary | ICD-10-CM | POA: Diagnosis not present

## 2019-12-27 DIAGNOSIS — I5032 Chronic diastolic (congestive) heart failure: Secondary | ICD-10-CM | POA: Diagnosis not present

## 2019-12-27 DIAGNOSIS — F0280 Dementia in other diseases classified elsewhere without behavioral disturbance: Secondary | ICD-10-CM | POA: Diagnosis not present

## 2019-12-27 DIAGNOSIS — G301 Alzheimer's disease with late onset: Secondary | ICD-10-CM | POA: Diagnosis not present

## 2019-12-27 DIAGNOSIS — I110 Hypertensive heart disease with heart failure: Secondary | ICD-10-CM | POA: Diagnosis not present

## 2019-12-27 DIAGNOSIS — E039 Hypothyroidism, unspecified: Secondary | ICD-10-CM | POA: Diagnosis not present

## 2019-12-27 DIAGNOSIS — E559 Vitamin D deficiency, unspecified: Secondary | ICD-10-CM | POA: Diagnosis not present

## 2019-12-29 DIAGNOSIS — J431 Panlobular emphysema: Secondary | ICD-10-CM | POA: Diagnosis not present

## 2019-12-29 DIAGNOSIS — I110 Hypertensive heart disease with heart failure: Secondary | ICD-10-CM | POA: Diagnosis not present

## 2019-12-29 DIAGNOSIS — E559 Vitamin D deficiency, unspecified: Secondary | ICD-10-CM | POA: Diagnosis not present

## 2019-12-29 DIAGNOSIS — Z03818 Encounter for observation for suspected exposure to other biological agents ruled out: Secondary | ICD-10-CM | POA: Diagnosis not present

## 2019-12-29 DIAGNOSIS — E039 Hypothyroidism, unspecified: Secondary | ICD-10-CM | POA: Diagnosis not present

## 2019-12-29 DIAGNOSIS — I5032 Chronic diastolic (congestive) heart failure: Secondary | ICD-10-CM | POA: Diagnosis not present

## 2019-12-29 DIAGNOSIS — E119 Type 2 diabetes mellitus without complications: Secondary | ICD-10-CM | POA: Diagnosis not present

## 2019-12-30 DIAGNOSIS — I110 Hypertensive heart disease with heart failure: Secondary | ICD-10-CM | POA: Diagnosis not present

## 2019-12-30 DIAGNOSIS — J431 Panlobular emphysema: Secondary | ICD-10-CM | POA: Diagnosis not present

## 2019-12-30 DIAGNOSIS — E039 Hypothyroidism, unspecified: Secondary | ICD-10-CM | POA: Diagnosis not present

## 2019-12-30 DIAGNOSIS — E119 Type 2 diabetes mellitus without complications: Secondary | ICD-10-CM | POA: Diagnosis not present

## 2019-12-30 DIAGNOSIS — E559 Vitamin D deficiency, unspecified: Secondary | ICD-10-CM | POA: Diagnosis not present

## 2019-12-30 DIAGNOSIS — I5032 Chronic diastolic (congestive) heart failure: Secondary | ICD-10-CM | POA: Diagnosis not present

## 2019-12-31 DIAGNOSIS — E559 Vitamin D deficiency, unspecified: Secondary | ICD-10-CM | POA: Diagnosis not present

## 2019-12-31 DIAGNOSIS — E119 Type 2 diabetes mellitus without complications: Secondary | ICD-10-CM | POA: Diagnosis not present

## 2019-12-31 DIAGNOSIS — I110 Hypertensive heart disease with heart failure: Secondary | ICD-10-CM | POA: Diagnosis not present

## 2019-12-31 DIAGNOSIS — E039 Hypothyroidism, unspecified: Secondary | ICD-10-CM | POA: Diagnosis not present

## 2019-12-31 DIAGNOSIS — I5032 Chronic diastolic (congestive) heart failure: Secondary | ICD-10-CM | POA: Diagnosis not present

## 2019-12-31 DIAGNOSIS — J431 Panlobular emphysema: Secondary | ICD-10-CM | POA: Diagnosis not present

## 2020-01-01 DIAGNOSIS — I5032 Chronic diastolic (congestive) heart failure: Secondary | ICD-10-CM | POA: Diagnosis not present

## 2020-01-01 DIAGNOSIS — E559 Vitamin D deficiency, unspecified: Secondary | ICD-10-CM | POA: Diagnosis not present

## 2020-01-01 DIAGNOSIS — J431 Panlobular emphysema: Secondary | ICD-10-CM | POA: Diagnosis not present

## 2020-01-01 DIAGNOSIS — I110 Hypertensive heart disease with heart failure: Secondary | ICD-10-CM | POA: Diagnosis not present

## 2020-01-01 DIAGNOSIS — E039 Hypothyroidism, unspecified: Secondary | ICD-10-CM | POA: Diagnosis not present

## 2020-01-01 DIAGNOSIS — E119 Type 2 diabetes mellitus without complications: Secondary | ICD-10-CM | POA: Diagnosis not present

## 2020-01-05 ENCOUNTER — Other Ambulatory Visit: Payer: Self-pay

## 2020-01-05 ENCOUNTER — Encounter: Payer: Self-pay | Admitting: Cardiology

## 2020-01-05 ENCOUNTER — Ambulatory Visit (INDEPENDENT_AMBULATORY_CARE_PROVIDER_SITE_OTHER): Payer: Medicare Other | Admitting: Cardiology

## 2020-01-05 VITALS — BP 130/80 | HR 96 | Ht 62.0 in | Wt 278.0 lb

## 2020-01-05 DIAGNOSIS — Z03818 Encounter for observation for suspected exposure to other biological agents ruled out: Secondary | ICD-10-CM | POA: Diagnosis not present

## 2020-01-05 DIAGNOSIS — I442 Atrioventricular block, complete: Secondary | ICD-10-CM | POA: Diagnosis not present

## 2020-01-05 DIAGNOSIS — Z950 Presence of cardiac pacemaker: Secondary | ICD-10-CM | POA: Diagnosis not present

## 2020-01-05 LAB — CUP PACEART INCLINIC DEVICE CHECK
Battery Impedance: 208 Ohm
Battery Remaining Longevity: 131 mo
Battery Voltage: 2.78 V
Brady Statistic AP VP Percent: 4 %
Brady Statistic AP VS Percent: 0 %
Brady Statistic AS VP Percent: 96 %
Brady Statistic AS VS Percent: 0 %
Date Time Interrogation Session: 20210816173004
Implantable Lead Implant Date: 20170727
Implantable Lead Implant Date: 20170727
Implantable Lead Location: 753859
Implantable Lead Location: 753860
Implantable Lead Model: 5076
Implantable Lead Model: 5076
Implantable Pulse Generator Implant Date: 20170727
Lead Channel Impedance Value: 537 Ohm
Lead Channel Impedance Value: 602 Ohm
Lead Channel Pacing Threshold Amplitude: 0.5 V
Lead Channel Pacing Threshold Amplitude: 0.5 V
Lead Channel Pacing Threshold Pulse Width: 0.4 ms
Lead Channel Pacing Threshold Pulse Width: 0.4 ms
Lead Channel Sensing Intrinsic Amplitude: 4 mV
Lead Channel Setting Pacing Amplitude: 1.5 V
Lead Channel Setting Pacing Amplitude: 2 V
Lead Channel Setting Pacing Pulse Width: 0.4 ms
Lead Channel Setting Sensing Sensitivity: 4 mV

## 2020-01-05 NOTE — Progress Notes (Signed)
Electrophysiology Office Note   Date:  01/05/2020   ID:  Danielle Dennis, DOB [birth year], MRN [id number]  PCP:  Bonnita Nasuti, MD  Cardiologist:   Primary Electrophysiologist:  Rhetta Cleek Meredith Leeds, MD    Chief Complaint: Pacemaker   History of Present Illness: Danielle Dennis is a 86 y.o. female who is being seen today for the evaluation of pacemaker at the request of Koleen Distance, NP. Presenting today for electrophysiology evaluation.  She has a history of HFpEF, COPD, diabetes, hypertension, hypothyroidism.  She also has a Medtronic dual-chamber pacemaker implanted for complete heart block.  Today, she denies symptoms of palpitations, chest pain, shortness of breath, orthopnea, PND, lower extremity edema, claudication, dizziness, presyncope, syncope, bleeding, or neurologic sequela. The patient is tolerating medications without difficulties.    Past Medical History:  Diagnosis Date   CHF (congestive heart failure) (HCC)    COPD (chronic obstructive pulmonary disease) (HCC)    Diabetes mellitus without complication (HCC)    GERD (gastroesophageal reflux disease)    Hypertension    Hypothyroidism    Presence of permanent cardiac pacemaker    History reviewed. No pertinent surgical history.   Current Outpatient Medications  Medication Sig Dispense Refill   apixaban (ELIQUIS) 5 MG TABS tablet Take 1 tablet (5 mg total) by mouth 2 (two) times daily. 60 tablet 1   aspirin 81 MG chewable tablet Chew 81 mg by mouth daily.     busPIRone (BUSPAR) 5 MG tablet Take 2.5 mg by mouth 2 (two) times daily.     calcium carbonate (TUMS - DOSED IN MG ELEMENTAL CALCIUM) 500 MG chewable tablet Chew 1 tablet by mouth 2 (two) times daily.     estradiol (ESTRACE) 1 MG tablet Take 1 mg by mouth daily.     fluticasone furoate-vilanterol (BREO ELLIPTA) 100-25 MCG/INH AEPB Inhale 1 puff into the lungs daily.     guaifenesin (ROBITUSSIN) 100 MG/5ML syrup Take 200 mg by mouth every 4 (four)  hours as needed for cough.     hydrALAZINE (APRESOLINE) 25 MG tablet Take 50 mg by mouth 3 (three) times daily.      levothyroxine (SYNTHROID, LEVOTHROID) 125 MCG tablet Take 125 mcg by mouth daily before breakfast.     metolazone (ZAROXOLYN) 2.5 MG tablet Take 2.5 mg by mouth 2 (two) times a week. On Tuesday and Friday     Multiple Vitamins-Minerals (CEROVITE SENIOR) TABS Take 1 tablet by mouth daily.     ondansetron (ZOFRAN) 4 MG tablet Take 4 mg by mouth every 6 (six) hours as needed for nausea or vomiting.     oxyCODONE (OXY IR/ROXICODONE) 5 MG immediate release tablet Take 5 mg by mouth every 12 (twelve) hours as needed for severe pain.     pantoprazole (PROTONIX) 40 MG tablet Take 40 mg by mouth daily.     polyethylene glycol (MIRALAX / GLYCOLAX) 17 g packet Take 17 g by mouth daily.     polyvinyl alcohol (LIQUIFILM TEARS) 1.4 % ophthalmic solution Place 1 drop into both eyes at bedtime.     potassium chloride SA (K-DUR,KLOR-CON) 20 MEQ tablet Take 40 mEq by mouth 2 (two) times daily.      sennosides-docusate sodium (SENOKOT-S) 8.6-50 MG tablet Take 2 tablets by mouth daily.     sitaGLIPtin (JANUVIA) 50 MG tablet Take 50 mg by mouth daily.     Vitamin D, Ergocalciferol, (DRISDOL) 1.25 MG (50000 UNIT) CAPS capsule Take 50,000 Units by mouth every 28 (twenty-eight) days.  albuterol (PROVENTIL) (2.5 MG/3ML) 0.083% nebulizer solution Take 3 mLs (2.5 mg total) by nebulization every 6 (six) hours as needed for wheezing or shortness of breath. (Patient not taking: Reported on 01/05/2020) 75 mL 12   apixaban (ELIQUIS) 5 MG TABS tablet Take 2 tablets (10 mg total) by mouth 2 (two) times daily for 7 days. 28 tablet 0   feeding supplement, ENSURE ENLIVE, (ENSURE ENLIVE) LIQD Take 237 mLs by mouth 2 (two) times daily between meals. (Patient not taking: Reported on 01/05/2020) 237 mL 12   insulin regular (NOVOLIN R,HUMULIN R) 100 units/mL injection Inject 0-10 Units into the skin 4 (four)  times daily -  with meals and at bedtime. Per sliding scale: <150 = 0 units 150-200 = 2 units 201-249 = 4 units 250-300 = 6 units 301-349 = 8 units 350-400 = 10 units >400 = Notify MD (Patient not taking: Reported on 01/05/2020)     loperamide (IMODIUM) 2 MG capsule Take 1 capsule (2 mg total) by mouth as needed for diarrhea or loose stools. (Patient not taking: Reported on 01/05/2020) 30 capsule 0   magnesium hydroxide (MILK OF MAGNESIA) 400 MG/5ML suspension Take 30 mLs by mouth daily as needed for mild constipation. (Patient not taking: Reported on 01/05/2020)     No current facility-administered medications for this visit.    Allergies:   Codeine   Social History:  The patient  reports that she has never smoked. She has never used smokeless tobacco. She reports previous alcohol use. She reports previous drug use.   Family History:  The patient's family history includes Heart Problems in her brother.    ROS:  Please see the history of present illness.   Otherwise, review of systems is positive for none.   All other systems are reviewed and negative.  Is nothing for me to my me something for somebody  PHYSICAL EXAM: VS:  BP 130/80    Pulse 96    Ht 5\' 2"  (1.575 m)    Wt 278 lb (126.1 kg)    SpO2 95%    BMI 50.85 kg/m  , BMI Body mass index is 50.85 kg/m. GEN: Well nourished, well developed, in no acute distress  HEENT: normal  Neck: no JVD, carotid bruits, or masses Cardiac: Danielle Dennis Dennis a family history thus tough on her RRR; no murmurs, rubs, or gallops,no edema  Respiratory:  clear to auscultation bilaterally, normal work of breathing GI: soft, nontender, nondistended, + BS MS: no deformity or atrophy  Skin: warm and dry, device pocket is well healed Neuro:  Strength and sensation are intact Psych: euthymic mood, full affect  EKG:  EKG is ordered today. Personal review of the ekg ordered shows sinus rhythm, ventricular paced  Device interrogation is reviewed today  in detail.  See PaceArt for details.   Recent Labs: No results found for requested labs within last 8760 hours.    Lipid Panel  No results found for: CHOL, TRIG, HDL, CHOLHDL, VLDL, LDLCALC, LDLDIRECT   Wt Readings from Last 3 Encounters:  01/05/20 278 lb (126.1 kg)  07/19/18 209 lb 14.1 oz (95.2 kg)      Other studies Reviewed: Additional studies/ records that were reviewed today include: Epic notes   ASSESSMENT AND PLAN:  1.  Complete heart block: Status post Medtronic dual-chamber pacemaker.  Device functioning appropriately.  No changes.  We Danielle Dennis set her up to follow in device clinic.  2.  Hypertension: Currently well controlled  Current medicines are reviewed at length with the patient today.   The patient does not have concerns regarding her medicines.  The following changes were made today:  none  Labs/ tests ordered today include:  No orders of the defined types were placed in this encounter.    Disposition:   FU with Danielle Dennis 1 year  Signed, Alaria Oconnor Meredith Leeds, MD  01/05/2020 4:46 PM     [place] [address] [phone] (office) [phone] (fax)

## 2020-01-05 NOTE — Patient Instructions (Signed)
Medication Instructions:  Your physician recommends that you continue on your current medications as directed. Please refer to the Current Medication list given to you today.  *If you need a refill on your cardiac medications before your next appointment, please call your pharmacy*   Lab Work: None ordered   Testing/Procedures: None ordered   Follow-Up: Remote monitoring is used to monitor your Pacemaker of ICD from home. This monitoring reduces the number of office visits required to check your device to one time per year. It allows [place] to keep an eye on the functioning of your device to ensure it is working properly. You are scheduled for a device check from home on 04/05/2020. You may send your transmission at any time that day. If you have a wireless device, the transmission will be sent automatically. After your physician reviews your transmission, you will receive a postcard with your next transmission date.  At [facility], you and your health needs are our priority.  As part of our continuing mission to provide you with exceptional heart care, we have created designated Provider Care Teams.  These Care Teams include your primary Cardiologist (physician) and Advanced Practice Providers (APPs -  Physician Assistants and Nurse Practitioners) who all work together to provide you with the care you need, when you need it.  We recommend signing up for the patient portal called "MyChart".  Sign up information is provided on this After Visit Summary.  MyChart is used to connect with patients for Virtual Visits (Telemedicine).  Patients are able to view lab/test results, encounter notes, upcoming appointments, etc.  Non-urgent messages can be sent to your provider as well.   To learn more about what you can do with MyChart, go to [email or web address].    Your next appointment:   1 year(s)  The format for your next appointment:   In Person  Provider:   Allegra Lai, MD   Thank you  for choosing [facility]!!   Trinidad Curet, RN [phone]    Other Instructions

## 2020-01-07 ENCOUNTER — Telehealth: Payer: Self-pay | Admitting: *Deleted

## 2020-01-07 NOTE — Telephone Encounter (Signed)
Attempted to reach pt's nurse at [facility] to discuss [facility] monitor status. Pt successfully transferred in [place], but last transmission received was in 01/2018. Pt reported during visit on 8/16 that monitor is plugged in at bedside. Will ensure monitor is setup and educate staff on monitor use.

## 2020-01-08 DIAGNOSIS — D649 Anemia, unspecified: Secondary | ICD-10-CM | POA: Diagnosis not present

## 2020-01-08 DIAGNOSIS — R946 Abnormal results of thyroid function studies: Secondary | ICD-10-CM | POA: Diagnosis not present

## 2020-01-08 DIAGNOSIS — E559 Vitamin D deficiency, unspecified: Secondary | ICD-10-CM | POA: Diagnosis not present

## 2020-01-08 DIAGNOSIS — E039 Hypothyroidism, unspecified: Secondary | ICD-10-CM | POA: Diagnosis not present

## 2020-01-08 DIAGNOSIS — D6489 Other specified anemias: Secondary | ICD-10-CM | POA: Diagnosis not present

## 2020-01-08 DIAGNOSIS — E119 Type 2 diabetes mellitus without complications: Secondary | ICD-10-CM | POA: Diagnosis not present

## 2020-01-08 NOTE — Telephone Encounter (Signed)
Transmission received. I put her on the correct 91 day schedule.

## 2020-01-08 NOTE — Telephone Encounter (Signed)
I spoke with the nurse Maudie Mercury to let her know we did not receive a transmission from the pt monitor since 01/2018. She states they did one last week. I told her it did not come through on our end. I gave her verbal instructions and fax paper instructions on how to send a transmission. I gave her my direct number to call to make sure we get it. [place] [phone] and fax [phone].

## 2020-01-09 ENCOUNTER — Ambulatory Visit (INDEPENDENT_AMBULATORY_CARE_PROVIDER_SITE_OTHER): Payer: Medicare Other | Admitting: *Deleted

## 2020-01-09 DIAGNOSIS — I110 Hypertensive heart disease with heart failure: Secondary | ICD-10-CM | POA: Diagnosis not present

## 2020-01-09 DIAGNOSIS — G301 Alzheimer's disease with late onset: Secondary | ICD-10-CM | POA: Diagnosis not present

## 2020-01-09 DIAGNOSIS — I442 Atrioventricular block, complete: Secondary | ICD-10-CM

## 2020-01-09 DIAGNOSIS — E039 Hypothyroidism, unspecified: Secondary | ICD-10-CM | POA: Diagnosis not present

## 2020-01-09 DIAGNOSIS — J431 Panlobular emphysema: Secondary | ICD-10-CM | POA: Diagnosis not present

## 2020-01-09 DIAGNOSIS — I5032 Chronic diastolic (congestive) heart failure: Secondary | ICD-10-CM | POA: Diagnosis not present

## 2020-01-09 LAB — CUP PACEART REMOTE DEVICE CHECK
Battery Impedance: 209 Ohm
Battery Remaining Longevity: 132 mo
Battery Voltage: 2.78 V
Brady Statistic AP VP Percent: 12 %
Brady Statistic AP VS Percent: 0 %
Brady Statistic AS VP Percent: 88 %
Brady Statistic AS VS Percent: 0 %
Date Time Interrogation Session: 20210819165102
Implantable Lead Implant Date: 20170727
Implantable Lead Implant Date: 20170727
Implantable Lead Location: 753859
Implantable Lead Location: 753860
Implantable Lead Model: 5076
Implantable Lead Model: 5076
Implantable Pulse Generator Implant Date: 20170727
Lead Channel Impedance Value: 536 Ohm
Lead Channel Impedance Value: 586 Ohm
Lead Channel Pacing Threshold Amplitude: 0.375 V
Lead Channel Pacing Threshold Amplitude: 0.75 V
Lead Channel Pacing Threshold Pulse Width: 0.4 ms
Lead Channel Pacing Threshold Pulse Width: 0.4 ms
Lead Channel Setting Pacing Amplitude: 1.5 V
Lead Channel Setting Pacing Amplitude: 2 V
Lead Channel Setting Pacing Pulse Width: 0.4 ms
Lead Channel Setting Sensing Sensitivity: 4 mV

## 2020-01-12 DIAGNOSIS — Z03818 Encounter for observation for suspected exposure to other biological agents ruled out: Secondary | ICD-10-CM | POA: Diagnosis not present

## 2020-01-12 NOTE — Progress Notes (Signed)
Remote pacemaker transmission.   

## 2020-01-14 NOTE — Addendum Note (Signed)
Addended by: Jeremy Johann on: 01/14/2020 04:37 PM   Modules accepted: Orders

## 2020-01-16 DIAGNOSIS — E039 Hypothyroidism, unspecified: Secondary | ICD-10-CM | POA: Diagnosis not present

## 2020-01-16 DIAGNOSIS — E559 Vitamin D deficiency, unspecified: Secondary | ICD-10-CM | POA: Diagnosis not present

## 2020-01-16 DIAGNOSIS — I10 Essential (primary) hypertension: Secondary | ICD-10-CM | POA: Diagnosis not present

## 2020-01-16 DIAGNOSIS — E119 Type 2 diabetes mellitus without complications: Secondary | ICD-10-CM | POA: Diagnosis not present

## 2020-01-19 DIAGNOSIS — Z03818 Encounter for observation for suspected exposure to other biological agents ruled out: Secondary | ICD-10-CM | POA: Diagnosis not present

## 2020-01-27 DIAGNOSIS — Z03818 Encounter for observation for suspected exposure to other biological agents ruled out: Secondary | ICD-10-CM | POA: Diagnosis not present

## 2020-01-30 DIAGNOSIS — E559 Vitamin D deficiency, unspecified: Secondary | ICD-10-CM | POA: Diagnosis not present

## 2020-01-30 DIAGNOSIS — I10 Essential (primary) hypertension: Secondary | ICD-10-CM | POA: Diagnosis not present

## 2020-01-30 DIAGNOSIS — I110 Hypertensive heart disease with heart failure: Secondary | ICD-10-CM | POA: Diagnosis not present

## 2020-01-30 DIAGNOSIS — E039 Hypothyroidism, unspecified: Secondary | ICD-10-CM | POA: Diagnosis not present

## 2020-02-02 DIAGNOSIS — Z03818 Encounter for observation for suspected exposure to other biological agents ruled out: Secondary | ICD-10-CM | POA: Diagnosis not present

## 2020-02-03 DIAGNOSIS — I110 Hypertensive heart disease with heart failure: Secondary | ICD-10-CM | POA: Diagnosis not present

## 2020-02-03 DIAGNOSIS — E559 Vitamin D deficiency, unspecified: Secondary | ICD-10-CM | POA: Diagnosis not present

## 2020-02-03 DIAGNOSIS — G301 Alzheimer's disease with late onset: Secondary | ICD-10-CM | POA: Diagnosis not present

## 2020-02-03 DIAGNOSIS — J431 Panlobular emphysema: Secondary | ICD-10-CM | POA: Diagnosis not present

## 2020-02-03 DIAGNOSIS — F3341 Major depressive disorder, recurrent, in partial remission: Secondary | ICD-10-CM | POA: Diagnosis not present

## 2020-02-03 DIAGNOSIS — E039 Hypothyroidism, unspecified: Secondary | ICD-10-CM | POA: Diagnosis not present

## 2020-02-03 DIAGNOSIS — F0280 Dementia in other diseases classified elsewhere without behavioral disturbance: Secondary | ICD-10-CM | POA: Diagnosis not present

## 2020-02-03 DIAGNOSIS — E119 Type 2 diabetes mellitus without complications: Secondary | ICD-10-CM | POA: Diagnosis not present

## 2020-02-03 DIAGNOSIS — I10 Essential (primary) hypertension: Secondary | ICD-10-CM | POA: Diagnosis not present

## 2020-02-03 DIAGNOSIS — I5032 Chronic diastolic (congestive) heart failure: Secondary | ICD-10-CM | POA: Diagnosis not present

## 2020-02-06 DIAGNOSIS — I110 Hypertensive heart disease with heart failure: Secondary | ICD-10-CM | POA: Diagnosis not present

## 2020-02-06 DIAGNOSIS — F419 Anxiety disorder, unspecified: Secondary | ICD-10-CM | POA: Diagnosis not present

## 2020-02-06 DIAGNOSIS — E119 Type 2 diabetes mellitus without complications: Secondary | ICD-10-CM | POA: Diagnosis not present

## 2020-02-06 DIAGNOSIS — I5032 Chronic diastolic (congestive) heart failure: Secondary | ICD-10-CM | POA: Diagnosis not present

## 2020-02-06 DIAGNOSIS — J431 Panlobular emphysema: Secondary | ICD-10-CM | POA: Diagnosis not present

## 2020-02-09 DIAGNOSIS — Z03818 Encounter for observation for suspected exposure to other biological agents ruled out: Secondary | ICD-10-CM | POA: Diagnosis not present

## 2020-02-13 DIAGNOSIS — K5900 Constipation, unspecified: Secondary | ICD-10-CM | POA: Diagnosis not present

## 2020-02-14 DIAGNOSIS — Z950 Presence of cardiac pacemaker: Secondary | ICD-10-CM | POA: Diagnosis not present

## 2020-02-15 DIAGNOSIS — R109 Unspecified abdominal pain: Secondary | ICD-10-CM | POA: Diagnosis not present

## 2020-02-16 DIAGNOSIS — Z03818 Encounter for observation for suspected exposure to other biological agents ruled out: Secondary | ICD-10-CM | POA: Diagnosis not present

## 2020-02-16 DIAGNOSIS — I5032 Chronic diastolic (congestive) heart failure: Secondary | ICD-10-CM | POA: Diagnosis not present

## 2020-02-16 DIAGNOSIS — J189 Pneumonia, unspecified organism: Secondary | ICD-10-CM | POA: Diagnosis not present

## 2020-02-16 DIAGNOSIS — J431 Panlobular emphysema: Secondary | ICD-10-CM | POA: Diagnosis not present

## 2020-03-05 DIAGNOSIS — J431 Panlobular emphysema: Secondary | ICD-10-CM | POA: Diagnosis not present

## 2020-03-05 DIAGNOSIS — I5032 Chronic diastolic (congestive) heart failure: Secondary | ICD-10-CM | POA: Diagnosis not present

## 2020-03-05 DIAGNOSIS — I110 Hypertensive heart disease with heart failure: Secondary | ICD-10-CM | POA: Diagnosis not present

## 2020-03-05 DIAGNOSIS — E039 Hypothyroidism, unspecified: Secondary | ICD-10-CM | POA: Diagnosis not present

## 2020-03-06 DIAGNOSIS — J431 Panlobular emphysema: Secondary | ICD-10-CM | POA: Diagnosis not present

## 2020-03-06 DIAGNOSIS — I10 Essential (primary) hypertension: Secondary | ICD-10-CM | POA: Diagnosis not present

## 2020-03-06 DIAGNOSIS — I5032 Chronic diastolic (congestive) heart failure: Secondary | ICD-10-CM | POA: Diagnosis not present

## 2020-03-06 DIAGNOSIS — F3341 Major depressive disorder, recurrent, in partial remission: Secondary | ICD-10-CM | POA: Diagnosis not present

## 2020-03-06 DIAGNOSIS — I110 Hypertensive heart disease with heart failure: Secondary | ICD-10-CM | POA: Diagnosis not present

## 2020-03-06 DIAGNOSIS — E119 Type 2 diabetes mellitus without complications: Secondary | ICD-10-CM | POA: Diagnosis not present

## 2020-03-06 DIAGNOSIS — G301 Alzheimer's disease with late onset: Secondary | ICD-10-CM | POA: Diagnosis not present

## 2020-03-06 DIAGNOSIS — F0280 Dementia in other diseases classified elsewhere without behavioral disturbance: Secondary | ICD-10-CM | POA: Diagnosis not present

## 2020-03-06 DIAGNOSIS — E559 Vitamin D deficiency, unspecified: Secondary | ICD-10-CM | POA: Diagnosis not present

## 2020-03-06 DIAGNOSIS — E039 Hypothyroidism, unspecified: Secondary | ICD-10-CM | POA: Diagnosis not present

## 2020-03-17 DIAGNOSIS — R262 Difficulty in walking, not elsewhere classified: Secondary | ICD-10-CM | POA: Diagnosis not present

## 2020-03-17 DIAGNOSIS — M8580 Other specified disorders of bone density and structure, unspecified site: Secondary | ICD-10-CM | POA: Diagnosis not present

## 2020-03-17 DIAGNOSIS — I110 Hypertensive heart disease with heart failure: Secondary | ICD-10-CM | POA: Diagnosis not present

## 2020-03-17 DIAGNOSIS — M4626 Osteomyelitis of vertebra, lumbar region: Secondary | ICD-10-CM | POA: Diagnosis not present

## 2020-03-17 DIAGNOSIS — I10 Essential (primary) hypertension: Secondary | ICD-10-CM | POA: Diagnosis not present

## 2020-03-17 DIAGNOSIS — R278 Other lack of coordination: Secondary | ICD-10-CM | POA: Diagnosis not present

## 2020-03-17 DIAGNOSIS — J9621 Acute and chronic respiratory failure with hypoxia: Secondary | ICD-10-CM | POA: Diagnosis not present

## 2020-03-17 DIAGNOSIS — F3341 Major depressive disorder, recurrent, in partial remission: Secondary | ICD-10-CM | POA: Diagnosis not present

## 2020-03-17 DIAGNOSIS — R1312 Dysphagia, oropharyngeal phase: Secondary | ICD-10-CM | POA: Diagnosis not present

## 2020-03-17 DIAGNOSIS — I2609 Other pulmonary embolism with acute cor pulmonale: Secondary | ICD-10-CM | POA: Diagnosis not present

## 2020-03-17 DIAGNOSIS — Z23 Encounter for immunization: Secondary | ICD-10-CM | POA: Diagnosis not present

## 2020-03-17 DIAGNOSIS — E559 Vitamin D deficiency, unspecified: Secondary | ICD-10-CM | POA: Diagnosis not present

## 2020-03-17 DIAGNOSIS — R1311 Dysphagia, oral phase: Secondary | ICD-10-CM | POA: Diagnosis not present

## 2020-03-17 DIAGNOSIS — J431 Panlobular emphysema: Secondary | ICD-10-CM | POA: Diagnosis not present

## 2020-03-17 DIAGNOSIS — M21372 Foot drop, left foot: Secondary | ICD-10-CM | POA: Diagnosis not present

## 2020-03-17 DIAGNOSIS — M21371 Foot drop, right foot: Secondary | ICD-10-CM | POA: Diagnosis not present

## 2020-03-17 DIAGNOSIS — J441 Chronic obstructive pulmonary disease with (acute) exacerbation: Secondary | ICD-10-CM | POA: Diagnosis not present

## 2020-03-17 DIAGNOSIS — G894 Chronic pain syndrome: Secondary | ICD-10-CM | POA: Diagnosis not present

## 2020-03-17 DIAGNOSIS — R41841 Cognitive communication deficit: Secondary | ICD-10-CM | POA: Diagnosis not present

## 2020-03-17 DIAGNOSIS — R293 Abnormal posture: Secondary | ICD-10-CM | POA: Diagnosis not present

## 2020-03-17 DIAGNOSIS — G301 Alzheimer's disease with late onset: Secondary | ICD-10-CM | POA: Diagnosis not present

## 2020-03-17 DIAGNOSIS — M6281 Muscle weakness (generalized): Secondary | ICD-10-CM | POA: Diagnosis not present

## 2020-03-17 DIAGNOSIS — E039 Hypothyroidism, unspecified: Secondary | ICD-10-CM | POA: Diagnosis not present

## 2020-03-17 DIAGNOSIS — F419 Anxiety disorder, unspecified: Secondary | ICD-10-CM | POA: Diagnosis not present

## 2020-03-17 DIAGNOSIS — I5032 Chronic diastolic (congestive) heart failure: Secondary | ICD-10-CM | POA: Diagnosis not present

## 2020-03-19 DIAGNOSIS — Z23 Encounter for immunization: Secondary | ICD-10-CM | POA: Diagnosis not present

## 2020-03-20 DIAGNOSIS — E039 Hypothyroidism, unspecified: Secondary | ICD-10-CM | POA: Diagnosis not present

## 2020-03-20 DIAGNOSIS — I5032 Chronic diastolic (congestive) heart failure: Secondary | ICD-10-CM | POA: Diagnosis not present

## 2020-03-20 DIAGNOSIS — E119 Type 2 diabetes mellitus without complications: Secondary | ICD-10-CM | POA: Diagnosis not present

## 2020-03-20 DIAGNOSIS — I110 Hypertensive heart disease with heart failure: Secondary | ICD-10-CM | POA: Diagnosis not present

## 2020-03-25 DIAGNOSIS — J431 Panlobular emphysema: Secondary | ICD-10-CM | POA: Diagnosis not present

## 2020-03-25 DIAGNOSIS — I110 Hypertensive heart disease with heart failure: Secondary | ICD-10-CM | POA: Diagnosis not present

## 2020-03-25 DIAGNOSIS — R278 Other lack of coordination: Secondary | ICD-10-CM | POA: Diagnosis not present

## 2020-03-25 DIAGNOSIS — J441 Chronic obstructive pulmonary disease with (acute) exacerbation: Secondary | ICD-10-CM | POA: Diagnosis not present

## 2020-03-25 DIAGNOSIS — K5900 Constipation, unspecified: Secondary | ICD-10-CM | POA: Diagnosis not present

## 2020-03-25 DIAGNOSIS — R1311 Dysphagia, oral phase: Secondary | ICD-10-CM | POA: Diagnosis not present

## 2020-03-25 DIAGNOSIS — I5032 Chronic diastolic (congestive) heart failure: Secondary | ICD-10-CM | POA: Diagnosis not present

## 2020-03-25 DIAGNOSIS — M6281 Muscle weakness (generalized): Secondary | ICD-10-CM | POA: Diagnosis not present

## 2020-03-25 DIAGNOSIS — F3341 Major depressive disorder, recurrent, in partial remission: Secondary | ICD-10-CM | POA: Diagnosis not present

## 2020-03-25 DIAGNOSIS — I2609 Other pulmonary embolism with acute cor pulmonale: Secondary | ICD-10-CM | POA: Diagnosis not present

## 2020-03-25 DIAGNOSIS — Z7984 Long term (current) use of oral hypoglycemic drugs: Secondary | ICD-10-CM | POA: Diagnosis not present

## 2020-03-25 DIAGNOSIS — M21371 Foot drop, right foot: Secondary | ICD-10-CM | POA: Diagnosis not present

## 2020-03-25 DIAGNOSIS — Z7951 Long term (current) use of inhaled steroids: Secondary | ICD-10-CM | POA: Diagnosis not present

## 2020-03-25 DIAGNOSIS — E039 Hypothyroidism, unspecified: Secondary | ICD-10-CM | POA: Diagnosis not present

## 2020-03-25 DIAGNOSIS — E559 Vitamin D deficiency, unspecified: Secondary | ICD-10-CM | POA: Diagnosis not present

## 2020-03-25 DIAGNOSIS — F0280 Dementia in other diseases classified elsewhere without behavioral disturbance: Secondary | ICD-10-CM | POA: Diagnosis not present

## 2020-03-25 DIAGNOSIS — G301 Alzheimer's disease with late onset: Secondary | ICD-10-CM | POA: Diagnosis not present

## 2020-03-25 DIAGNOSIS — E119 Type 2 diabetes mellitus without complications: Secondary | ICD-10-CM | POA: Diagnosis not present

## 2020-03-25 DIAGNOSIS — F419 Anxiety disorder, unspecified: Secondary | ICD-10-CM | POA: Diagnosis not present

## 2020-03-25 DIAGNOSIS — Z7901 Long term (current) use of anticoagulants: Secondary | ICD-10-CM | POA: Diagnosis not present

## 2020-03-25 DIAGNOSIS — H524 Presbyopia: Secondary | ICD-10-CM | POA: Diagnosis not present

## 2020-03-25 DIAGNOSIS — M21372 Foot drop, left foot: Secondary | ICD-10-CM | POA: Diagnosis not present

## 2020-03-25 DIAGNOSIS — G894 Chronic pain syndrome: Secondary | ICD-10-CM | POA: Diagnosis not present

## 2020-03-25 DIAGNOSIS — H04123 Dry eye syndrome of bilateral lacrimal glands: Secondary | ICD-10-CM | POA: Diagnosis not present

## 2020-03-25 DIAGNOSIS — R293 Abnormal posture: Secondary | ICD-10-CM | POA: Diagnosis not present

## 2020-03-26 DIAGNOSIS — E119 Type 2 diabetes mellitus without complications: Secondary | ICD-10-CM | POA: Diagnosis not present

## 2020-03-26 DIAGNOSIS — I110 Hypertensive heart disease with heart failure: Secondary | ICD-10-CM | POA: Diagnosis not present

## 2020-03-26 DIAGNOSIS — I5032 Chronic diastolic (congestive) heart failure: Secondary | ICD-10-CM | POA: Diagnosis not present

## 2020-03-26 DIAGNOSIS — G894 Chronic pain syndrome: Secondary | ICD-10-CM | POA: Diagnosis not present

## 2020-03-26 DIAGNOSIS — J431 Panlobular emphysema: Secondary | ICD-10-CM | POA: Diagnosis not present

## 2020-03-26 DIAGNOSIS — E039 Hypothyroidism, unspecified: Secondary | ICD-10-CM | POA: Diagnosis not present

## 2020-03-27 DIAGNOSIS — G894 Chronic pain syndrome: Secondary | ICD-10-CM | POA: Diagnosis not present

## 2020-03-27 DIAGNOSIS — J431 Panlobular emphysema: Secondary | ICD-10-CM | POA: Diagnosis not present

## 2020-03-27 DIAGNOSIS — I5032 Chronic diastolic (congestive) heart failure: Secondary | ICD-10-CM | POA: Diagnosis not present

## 2020-03-27 DIAGNOSIS — E119 Type 2 diabetes mellitus without complications: Secondary | ICD-10-CM | POA: Diagnosis not present

## 2020-03-27 DIAGNOSIS — E039 Hypothyroidism, unspecified: Secondary | ICD-10-CM | POA: Diagnosis not present

## 2020-03-27 DIAGNOSIS — I110 Hypertensive heart disease with heart failure: Secondary | ICD-10-CM | POA: Diagnosis not present

## 2020-03-28 DIAGNOSIS — E039 Hypothyroidism, unspecified: Secondary | ICD-10-CM | POA: Diagnosis not present

## 2020-03-28 DIAGNOSIS — I5032 Chronic diastolic (congestive) heart failure: Secondary | ICD-10-CM | POA: Diagnosis not present

## 2020-03-28 DIAGNOSIS — G894 Chronic pain syndrome: Secondary | ICD-10-CM | POA: Diagnosis not present

## 2020-03-28 DIAGNOSIS — J431 Panlobular emphysema: Secondary | ICD-10-CM | POA: Diagnosis not present

## 2020-03-28 DIAGNOSIS — I110 Hypertensive heart disease with heart failure: Secondary | ICD-10-CM | POA: Diagnosis not present

## 2020-03-28 DIAGNOSIS — E119 Type 2 diabetes mellitus without complications: Secondary | ICD-10-CM | POA: Diagnosis not present

## 2020-03-29 DIAGNOSIS — I110 Hypertensive heart disease with heart failure: Secondary | ICD-10-CM | POA: Diagnosis not present

## 2020-03-29 DIAGNOSIS — E119 Type 2 diabetes mellitus without complications: Secondary | ICD-10-CM | POA: Diagnosis not present

## 2020-03-29 DIAGNOSIS — J431 Panlobular emphysema: Secondary | ICD-10-CM | POA: Diagnosis not present

## 2020-03-29 DIAGNOSIS — G894 Chronic pain syndrome: Secondary | ICD-10-CM | POA: Diagnosis not present

## 2020-03-29 DIAGNOSIS — I5032 Chronic diastolic (congestive) heart failure: Secondary | ICD-10-CM | POA: Diagnosis not present

## 2020-03-29 DIAGNOSIS — E039 Hypothyroidism, unspecified: Secondary | ICD-10-CM | POA: Diagnosis not present

## 2020-04-01 DIAGNOSIS — G894 Chronic pain syndrome: Secondary | ICD-10-CM | POA: Diagnosis not present

## 2020-04-01 DIAGNOSIS — I110 Hypertensive heart disease with heart failure: Secondary | ICD-10-CM | POA: Diagnosis not present

## 2020-04-01 DIAGNOSIS — J431 Panlobular emphysema: Secondary | ICD-10-CM | POA: Diagnosis not present

## 2020-04-01 DIAGNOSIS — E039 Hypothyroidism, unspecified: Secondary | ICD-10-CM | POA: Diagnosis not present

## 2020-04-01 DIAGNOSIS — E119 Type 2 diabetes mellitus without complications: Secondary | ICD-10-CM | POA: Diagnosis not present

## 2020-04-01 DIAGNOSIS — I5032 Chronic diastolic (congestive) heart failure: Secondary | ICD-10-CM | POA: Diagnosis not present

## 2020-04-02 DIAGNOSIS — E119 Type 2 diabetes mellitus without complications: Secondary | ICD-10-CM | POA: Diagnosis not present

## 2020-04-02 DIAGNOSIS — G894 Chronic pain syndrome: Secondary | ICD-10-CM | POA: Diagnosis not present

## 2020-04-02 DIAGNOSIS — I110 Hypertensive heart disease with heart failure: Secondary | ICD-10-CM | POA: Diagnosis not present

## 2020-04-02 DIAGNOSIS — J431 Panlobular emphysema: Secondary | ICD-10-CM | POA: Diagnosis not present

## 2020-04-02 DIAGNOSIS — I5032 Chronic diastolic (congestive) heart failure: Secondary | ICD-10-CM | POA: Diagnosis not present

## 2020-04-02 DIAGNOSIS — E039 Hypothyroidism, unspecified: Secondary | ICD-10-CM | POA: Diagnosis not present

## 2020-04-05 DIAGNOSIS — G894 Chronic pain syndrome: Secondary | ICD-10-CM | POA: Diagnosis not present

## 2020-04-05 DIAGNOSIS — E039 Hypothyroidism, unspecified: Secondary | ICD-10-CM | POA: Diagnosis not present

## 2020-04-05 DIAGNOSIS — E119 Type 2 diabetes mellitus without complications: Secondary | ICD-10-CM | POA: Diagnosis not present

## 2020-04-05 DIAGNOSIS — I5032 Chronic diastolic (congestive) heart failure: Secondary | ICD-10-CM | POA: Diagnosis not present

## 2020-04-05 DIAGNOSIS — J431 Panlobular emphysema: Secondary | ICD-10-CM | POA: Diagnosis not present

## 2020-04-05 DIAGNOSIS — I110 Hypertensive heart disease with heart failure: Secondary | ICD-10-CM | POA: Diagnosis not present

## 2020-04-06 DIAGNOSIS — E039 Hypothyroidism, unspecified: Secondary | ICD-10-CM | POA: Diagnosis not present

## 2020-04-06 DIAGNOSIS — I5032 Chronic diastolic (congestive) heart failure: Secondary | ICD-10-CM | POA: Diagnosis not present

## 2020-04-06 DIAGNOSIS — J431 Panlobular emphysema: Secondary | ICD-10-CM | POA: Diagnosis not present

## 2020-04-06 DIAGNOSIS — E119 Type 2 diabetes mellitus without complications: Secondary | ICD-10-CM | POA: Diagnosis not present

## 2020-04-06 DIAGNOSIS — G894 Chronic pain syndrome: Secondary | ICD-10-CM | POA: Diagnosis not present

## 2020-04-06 DIAGNOSIS — I110 Hypertensive heart disease with heart failure: Secondary | ICD-10-CM | POA: Diagnosis not present

## 2020-04-07 DIAGNOSIS — I110 Hypertensive heart disease with heart failure: Secondary | ICD-10-CM | POA: Diagnosis not present

## 2020-04-07 DIAGNOSIS — G894 Chronic pain syndrome: Secondary | ICD-10-CM | POA: Diagnosis not present

## 2020-04-07 DIAGNOSIS — E119 Type 2 diabetes mellitus without complications: Secondary | ICD-10-CM | POA: Diagnosis not present

## 2020-04-07 DIAGNOSIS — J431 Panlobular emphysema: Secondary | ICD-10-CM | POA: Diagnosis not present

## 2020-04-07 DIAGNOSIS — E039 Hypothyroidism, unspecified: Secondary | ICD-10-CM | POA: Diagnosis not present

## 2020-04-07 DIAGNOSIS — I5032 Chronic diastolic (congestive) heart failure: Secondary | ICD-10-CM | POA: Diagnosis not present

## 2020-04-08 DIAGNOSIS — E039 Hypothyroidism, unspecified: Secondary | ICD-10-CM | POA: Diagnosis not present

## 2020-04-08 DIAGNOSIS — G894 Chronic pain syndrome: Secondary | ICD-10-CM | POA: Diagnosis not present

## 2020-04-08 DIAGNOSIS — I5032 Chronic diastolic (congestive) heart failure: Secondary | ICD-10-CM | POA: Diagnosis not present

## 2020-04-08 DIAGNOSIS — Z780 Asymptomatic menopausal state: Secondary | ICD-10-CM | POA: Diagnosis not present

## 2020-04-08 DIAGNOSIS — J431 Panlobular emphysema: Secondary | ICD-10-CM | POA: Diagnosis not present

## 2020-04-08 DIAGNOSIS — E1142 Type 2 diabetes mellitus with diabetic polyneuropathy: Secondary | ICD-10-CM | POA: Diagnosis not present

## 2020-04-08 DIAGNOSIS — I110 Hypertensive heart disease with heart failure: Secondary | ICD-10-CM | POA: Diagnosis not present

## 2020-04-08 DIAGNOSIS — I10 Essential (primary) hypertension: Secondary | ICD-10-CM | POA: Diagnosis not present

## 2020-04-08 DIAGNOSIS — M6281 Muscle weakness (generalized): Secondary | ICD-10-CM | POA: Diagnosis not present

## 2020-04-08 DIAGNOSIS — J449 Chronic obstructive pulmonary disease, unspecified: Secondary | ICD-10-CM | POA: Diagnosis not present

## 2020-04-08 DIAGNOSIS — E119 Type 2 diabetes mellitus without complications: Secondary | ICD-10-CM | POA: Diagnosis not present

## 2020-04-08 DIAGNOSIS — G8929 Other chronic pain: Secondary | ICD-10-CM | POA: Diagnosis not present

## 2020-04-08 DIAGNOSIS — R300 Dysuria: Secondary | ICD-10-CM | POA: Diagnosis not present

## 2020-04-09 ENCOUNTER — Ambulatory Visit (INDEPENDENT_AMBULATORY_CARE_PROVIDER_SITE_OTHER): Payer: Medicare Other

## 2020-04-09 DIAGNOSIS — I442 Atrioventricular block, complete: Secondary | ICD-10-CM | POA: Diagnosis not present

## 2020-04-09 DIAGNOSIS — R300 Dysuria: Secondary | ICD-10-CM | POA: Diagnosis not present

## 2020-04-10 DIAGNOSIS — E559 Vitamin D deficiency, unspecified: Secondary | ICD-10-CM | POA: Diagnosis not present

## 2020-04-10 DIAGNOSIS — E039 Hypothyroidism, unspecified: Secondary | ICD-10-CM | POA: Diagnosis not present

## 2020-04-10 DIAGNOSIS — D6489 Other specified anemias: Secondary | ICD-10-CM | POA: Diagnosis not present

## 2020-04-10 DIAGNOSIS — E119 Type 2 diabetes mellitus without complications: Secondary | ICD-10-CM | POA: Diagnosis not present

## 2020-04-10 DIAGNOSIS — R946 Abnormal results of thyroid function studies: Secondary | ICD-10-CM | POA: Diagnosis not present

## 2020-04-11 DIAGNOSIS — I5032 Chronic diastolic (congestive) heart failure: Secondary | ICD-10-CM | POA: Diagnosis not present

## 2020-04-11 DIAGNOSIS — E119 Type 2 diabetes mellitus without complications: Secondary | ICD-10-CM | POA: Diagnosis not present

## 2020-04-11 DIAGNOSIS — J431 Panlobular emphysema: Secondary | ICD-10-CM | POA: Diagnosis not present

## 2020-04-11 DIAGNOSIS — G894 Chronic pain syndrome: Secondary | ICD-10-CM | POA: Diagnosis not present

## 2020-04-11 DIAGNOSIS — E039 Hypothyroidism, unspecified: Secondary | ICD-10-CM | POA: Diagnosis not present

## 2020-04-11 DIAGNOSIS — I110 Hypertensive heart disease with heart failure: Secondary | ICD-10-CM | POA: Diagnosis not present

## 2020-04-12 DIAGNOSIS — G894 Chronic pain syndrome: Secondary | ICD-10-CM | POA: Diagnosis not present

## 2020-04-12 DIAGNOSIS — I5032 Chronic diastolic (congestive) heart failure: Secondary | ICD-10-CM | POA: Diagnosis not present

## 2020-04-12 DIAGNOSIS — J431 Panlobular emphysema: Secondary | ICD-10-CM | POA: Diagnosis not present

## 2020-04-12 DIAGNOSIS — E039 Hypothyroidism, unspecified: Secondary | ICD-10-CM | POA: Diagnosis not present

## 2020-04-12 DIAGNOSIS — I110 Hypertensive heart disease with heart failure: Secondary | ICD-10-CM | POA: Diagnosis not present

## 2020-04-12 DIAGNOSIS — E119 Type 2 diabetes mellitus without complications: Secondary | ICD-10-CM | POA: Diagnosis not present

## 2020-04-12 LAB — CUP PACEART REMOTE DEVICE CHECK
Battery Impedance: 257 Ohm
Battery Remaining Longevity: 123 mo
Battery Voltage: 2.78 V
Brady Statistic AP VP Percent: 11 %
Brady Statistic AP VS Percent: 0 %
Brady Statistic AS VP Percent: 89 %
Brady Statistic AS VS Percent: 0 %
Date Time Interrogation Session: 20211122140735
Implantable Lead Implant Date: 20170727
Implantable Lead Implant Date: 20170727
Implantable Lead Location: 753859
Implantable Lead Location: 753860
Implantable Lead Model: 5076
Implantable Lead Model: 5076
Implantable Pulse Generator Implant Date: 20170727
Lead Channel Impedance Value: 529 Ohm
Lead Channel Impedance Value: 586 Ohm
Lead Channel Pacing Threshold Amplitude: 0.375 V
Lead Channel Pacing Threshold Amplitude: 0.625 V
Lead Channel Pacing Threshold Pulse Width: 0.4 ms
Lead Channel Pacing Threshold Pulse Width: 0.4 ms
Lead Channel Setting Pacing Amplitude: 1.5 V
Lead Channel Setting Pacing Amplitude: 2 V
Lead Channel Setting Pacing Pulse Width: 0.4 ms
Lead Channel Setting Sensing Sensitivity: 4 mV

## 2020-04-13 DIAGNOSIS — G894 Chronic pain syndrome: Secondary | ICD-10-CM | POA: Diagnosis not present

## 2020-04-13 DIAGNOSIS — I110 Hypertensive heart disease with heart failure: Secondary | ICD-10-CM | POA: Diagnosis not present

## 2020-04-13 DIAGNOSIS — E119 Type 2 diabetes mellitus without complications: Secondary | ICD-10-CM | POA: Diagnosis not present

## 2020-04-13 DIAGNOSIS — J431 Panlobular emphysema: Secondary | ICD-10-CM | POA: Diagnosis not present

## 2020-04-13 DIAGNOSIS — I5032 Chronic diastolic (congestive) heart failure: Secondary | ICD-10-CM | POA: Diagnosis not present

## 2020-04-13 DIAGNOSIS — E039 Hypothyroidism, unspecified: Secondary | ICD-10-CM | POA: Diagnosis not present

## 2020-04-13 NOTE — Progress Notes (Signed)
Remote pacemaker transmission.   

## 2020-04-14 DIAGNOSIS — I5032 Chronic diastolic (congestive) heart failure: Secondary | ICD-10-CM | POA: Diagnosis not present

## 2020-04-14 DIAGNOSIS — J431 Panlobular emphysema: Secondary | ICD-10-CM | POA: Diagnosis not present

## 2020-04-14 DIAGNOSIS — E039 Hypothyroidism, unspecified: Secondary | ICD-10-CM | POA: Diagnosis not present

## 2020-04-14 DIAGNOSIS — G894 Chronic pain syndrome: Secondary | ICD-10-CM | POA: Diagnosis not present

## 2020-04-14 DIAGNOSIS — I110 Hypertensive heart disease with heart failure: Secondary | ICD-10-CM | POA: Diagnosis not present

## 2020-04-14 DIAGNOSIS — E119 Type 2 diabetes mellitus without complications: Secondary | ICD-10-CM | POA: Diagnosis not present

## 2020-04-16 DIAGNOSIS — E039 Hypothyroidism, unspecified: Secondary | ICD-10-CM | POA: Diagnosis not present

## 2020-04-16 DIAGNOSIS — E119 Type 2 diabetes mellitus without complications: Secondary | ICD-10-CM | POA: Diagnosis not present

## 2020-04-16 DIAGNOSIS — I5032 Chronic diastolic (congestive) heart failure: Secondary | ICD-10-CM | POA: Diagnosis not present

## 2020-04-16 DIAGNOSIS — G894 Chronic pain syndrome: Secondary | ICD-10-CM | POA: Diagnosis not present

## 2020-04-16 DIAGNOSIS — I110 Hypertensive heart disease with heart failure: Secondary | ICD-10-CM | POA: Diagnosis not present

## 2020-04-16 DIAGNOSIS — J431 Panlobular emphysema: Secondary | ICD-10-CM | POA: Diagnosis not present

## 2020-04-17 DIAGNOSIS — J431 Panlobular emphysema: Secondary | ICD-10-CM | POA: Diagnosis not present

## 2020-04-17 DIAGNOSIS — I5032 Chronic diastolic (congestive) heart failure: Secondary | ICD-10-CM | POA: Diagnosis not present

## 2020-04-17 DIAGNOSIS — E039 Hypothyroidism, unspecified: Secondary | ICD-10-CM | POA: Diagnosis not present

## 2020-04-17 DIAGNOSIS — E119 Type 2 diabetes mellitus without complications: Secondary | ICD-10-CM | POA: Diagnosis not present

## 2020-04-17 DIAGNOSIS — G894 Chronic pain syndrome: Secondary | ICD-10-CM | POA: Diagnosis not present

## 2020-04-17 DIAGNOSIS — I110 Hypertensive heart disease with heart failure: Secondary | ICD-10-CM | POA: Diagnosis not present

## 2020-04-19 DIAGNOSIS — I5032 Chronic diastolic (congestive) heart failure: Secondary | ICD-10-CM | POA: Diagnosis not present

## 2020-04-19 DIAGNOSIS — E039 Hypothyroidism, unspecified: Secondary | ICD-10-CM | POA: Diagnosis not present

## 2020-04-19 DIAGNOSIS — E119 Type 2 diabetes mellitus without complications: Secondary | ICD-10-CM | POA: Diagnosis not present

## 2020-04-19 DIAGNOSIS — G894 Chronic pain syndrome: Secondary | ICD-10-CM | POA: Diagnosis not present

## 2020-04-19 DIAGNOSIS — J431 Panlobular emphysema: Secondary | ICD-10-CM | POA: Diagnosis not present

## 2020-04-19 DIAGNOSIS — I110 Hypertensive heart disease with heart failure: Secondary | ICD-10-CM | POA: Diagnosis not present

## 2020-04-20 DIAGNOSIS — I110 Hypertensive heart disease with heart failure: Secondary | ICD-10-CM | POA: Diagnosis not present

## 2020-04-20 DIAGNOSIS — E119 Type 2 diabetes mellitus without complications: Secondary | ICD-10-CM | POA: Diagnosis not present

## 2020-04-20 DIAGNOSIS — E039 Hypothyroidism, unspecified: Secondary | ICD-10-CM | POA: Diagnosis not present

## 2020-04-20 DIAGNOSIS — I5032 Chronic diastolic (congestive) heart failure: Secondary | ICD-10-CM | POA: Diagnosis not present

## 2020-04-20 DIAGNOSIS — J431 Panlobular emphysema: Secondary | ICD-10-CM | POA: Diagnosis not present

## 2020-04-20 DIAGNOSIS — G894 Chronic pain syndrome: Secondary | ICD-10-CM | POA: Diagnosis not present

## 2020-04-21 DIAGNOSIS — J449 Chronic obstructive pulmonary disease, unspecified: Secondary | ICD-10-CM | POA: Diagnosis not present

## 2020-04-21 DIAGNOSIS — Z780 Asymptomatic menopausal state: Secondary | ICD-10-CM | POA: Diagnosis not present

## 2020-04-21 DIAGNOSIS — H524 Presbyopia: Secondary | ICD-10-CM | POA: Diagnosis not present

## 2020-04-21 DIAGNOSIS — Z7984 Long term (current) use of oral hypoglycemic drugs: Secondary | ICD-10-CM | POA: Diagnosis not present

## 2020-04-21 DIAGNOSIS — Z7901 Long term (current) use of anticoagulants: Secondary | ICD-10-CM | POA: Diagnosis not present

## 2020-04-21 DIAGNOSIS — R278 Other lack of coordination: Secondary | ICD-10-CM | POA: Diagnosis not present

## 2020-04-21 DIAGNOSIS — G8929 Other chronic pain: Secondary | ICD-10-CM | POA: Diagnosis not present

## 2020-04-21 DIAGNOSIS — J431 Panlobular emphysema: Secondary | ICD-10-CM | POA: Diagnosis not present

## 2020-04-21 DIAGNOSIS — F419 Anxiety disorder, unspecified: Secondary | ICD-10-CM | POA: Diagnosis not present

## 2020-04-21 DIAGNOSIS — E1142 Type 2 diabetes mellitus with diabetic polyneuropathy: Secondary | ICD-10-CM | POA: Diagnosis not present

## 2020-04-21 DIAGNOSIS — M6281 Muscle weakness (generalized): Secondary | ICD-10-CM | POA: Diagnosis not present

## 2020-04-21 DIAGNOSIS — G894 Chronic pain syndrome: Secondary | ICD-10-CM | POA: Diagnosis not present

## 2020-04-21 DIAGNOSIS — K219 Gastro-esophageal reflux disease without esophagitis: Secondary | ICD-10-CM | POA: Diagnosis not present

## 2020-04-21 DIAGNOSIS — E039 Hypothyroidism, unspecified: Secondary | ICD-10-CM | POA: Diagnosis not present

## 2020-04-21 DIAGNOSIS — K5900 Constipation, unspecified: Secondary | ICD-10-CM | POA: Diagnosis not present

## 2020-04-21 DIAGNOSIS — M21372 Foot drop, left foot: Secondary | ICD-10-CM | POA: Diagnosis not present

## 2020-04-21 DIAGNOSIS — F3341 Major depressive disorder, recurrent, in partial remission: Secondary | ICD-10-CM | POA: Diagnosis not present

## 2020-04-21 DIAGNOSIS — G301 Alzheimer's disease with late onset: Secondary | ICD-10-CM | POA: Diagnosis not present

## 2020-04-21 DIAGNOSIS — F0390 Unspecified dementia without behavioral disturbance: Secondary | ICD-10-CM | POA: Diagnosis not present

## 2020-04-21 DIAGNOSIS — I110 Hypertensive heart disease with heart failure: Secondary | ICD-10-CM | POA: Diagnosis not present

## 2020-04-21 DIAGNOSIS — R293 Abnormal posture: Secondary | ICD-10-CM | POA: Diagnosis not present

## 2020-04-21 DIAGNOSIS — Z86711 Personal history of pulmonary embolism: Secondary | ICD-10-CM | POA: Diagnosis not present

## 2020-04-21 DIAGNOSIS — I5032 Chronic diastolic (congestive) heart failure: Secondary | ICD-10-CM | POA: Diagnosis not present

## 2020-04-21 DIAGNOSIS — M21371 Foot drop, right foot: Secondary | ICD-10-CM | POA: Diagnosis not present

## 2020-04-21 DIAGNOSIS — H04123 Dry eye syndrome of bilateral lacrimal glands: Secondary | ICD-10-CM | POA: Diagnosis not present

## 2020-04-23 DIAGNOSIS — J431 Panlobular emphysema: Secondary | ICD-10-CM | POA: Diagnosis not present

## 2020-04-23 DIAGNOSIS — M21372 Foot drop, left foot: Secondary | ICD-10-CM | POA: Diagnosis not present

## 2020-04-23 DIAGNOSIS — E1142 Type 2 diabetes mellitus with diabetic polyneuropathy: Secondary | ICD-10-CM | POA: Diagnosis not present

## 2020-04-23 DIAGNOSIS — R278 Other lack of coordination: Secondary | ICD-10-CM | POA: Diagnosis not present

## 2020-04-23 DIAGNOSIS — I5032 Chronic diastolic (congestive) heart failure: Secondary | ICD-10-CM | POA: Diagnosis not present

## 2020-04-23 DIAGNOSIS — M21371 Foot drop, right foot: Secondary | ICD-10-CM | POA: Diagnosis not present

## 2020-04-24 DIAGNOSIS — J431 Panlobular emphysema: Secondary | ICD-10-CM | POA: Diagnosis not present

## 2020-04-24 DIAGNOSIS — E1142 Type 2 diabetes mellitus with diabetic polyneuropathy: Secondary | ICD-10-CM | POA: Diagnosis not present

## 2020-04-24 DIAGNOSIS — R278 Other lack of coordination: Secondary | ICD-10-CM | POA: Diagnosis not present

## 2020-04-24 DIAGNOSIS — M21371 Foot drop, right foot: Secondary | ICD-10-CM | POA: Diagnosis not present

## 2020-04-24 DIAGNOSIS — I5032 Chronic diastolic (congestive) heart failure: Secondary | ICD-10-CM | POA: Diagnosis not present

## 2020-04-24 DIAGNOSIS — M21372 Foot drop, left foot: Secondary | ICD-10-CM | POA: Diagnosis not present

## 2020-04-25 DIAGNOSIS — E1142 Type 2 diabetes mellitus with diabetic polyneuropathy: Secondary | ICD-10-CM | POA: Diagnosis not present

## 2020-04-25 DIAGNOSIS — M21372 Foot drop, left foot: Secondary | ICD-10-CM | POA: Diagnosis not present

## 2020-04-25 DIAGNOSIS — I5032 Chronic diastolic (congestive) heart failure: Secondary | ICD-10-CM | POA: Diagnosis not present

## 2020-04-25 DIAGNOSIS — R278 Other lack of coordination: Secondary | ICD-10-CM | POA: Diagnosis not present

## 2020-04-25 DIAGNOSIS — M21371 Foot drop, right foot: Secondary | ICD-10-CM | POA: Diagnosis not present

## 2020-04-25 DIAGNOSIS — J431 Panlobular emphysema: Secondary | ICD-10-CM | POA: Diagnosis not present

## 2020-04-27 DIAGNOSIS — I5032 Chronic diastolic (congestive) heart failure: Secondary | ICD-10-CM | POA: Diagnosis not present

## 2020-04-27 DIAGNOSIS — M21372 Foot drop, left foot: Secondary | ICD-10-CM | POA: Diagnosis not present

## 2020-04-27 DIAGNOSIS — M21371 Foot drop, right foot: Secondary | ICD-10-CM | POA: Diagnosis not present

## 2020-04-27 DIAGNOSIS — J431 Panlobular emphysema: Secondary | ICD-10-CM | POA: Diagnosis not present

## 2020-04-27 DIAGNOSIS — R278 Other lack of coordination: Secondary | ICD-10-CM | POA: Diagnosis not present

## 2020-04-27 DIAGNOSIS — E1142 Type 2 diabetes mellitus with diabetic polyneuropathy: Secondary | ICD-10-CM | POA: Diagnosis not present

## 2020-04-28 DIAGNOSIS — J431 Panlobular emphysema: Secondary | ICD-10-CM | POA: Diagnosis not present

## 2020-04-28 DIAGNOSIS — R278 Other lack of coordination: Secondary | ICD-10-CM | POA: Diagnosis not present

## 2020-04-28 DIAGNOSIS — E1142 Type 2 diabetes mellitus with diabetic polyneuropathy: Secondary | ICD-10-CM | POA: Diagnosis not present

## 2020-04-28 DIAGNOSIS — M21372 Foot drop, left foot: Secondary | ICD-10-CM | POA: Diagnosis not present

## 2020-04-28 DIAGNOSIS — M21371 Foot drop, right foot: Secondary | ICD-10-CM | POA: Diagnosis not present

## 2020-04-28 DIAGNOSIS — I5032 Chronic diastolic (congestive) heart failure: Secondary | ICD-10-CM | POA: Diagnosis not present

## 2020-05-01 DIAGNOSIS — E1142 Type 2 diabetes mellitus with diabetic polyneuropathy: Secondary | ICD-10-CM | POA: Diagnosis not present

## 2020-05-01 DIAGNOSIS — M21372 Foot drop, left foot: Secondary | ICD-10-CM | POA: Diagnosis not present

## 2020-05-01 DIAGNOSIS — M21371 Foot drop, right foot: Secondary | ICD-10-CM | POA: Diagnosis not present

## 2020-05-01 DIAGNOSIS — R278 Other lack of coordination: Secondary | ICD-10-CM | POA: Diagnosis not present

## 2020-05-01 DIAGNOSIS — J431 Panlobular emphysema: Secondary | ICD-10-CM | POA: Diagnosis not present

## 2020-05-01 DIAGNOSIS — I5032 Chronic diastolic (congestive) heart failure: Secondary | ICD-10-CM | POA: Diagnosis not present

## 2020-05-02 DIAGNOSIS — M21371 Foot drop, right foot: Secondary | ICD-10-CM | POA: Diagnosis not present

## 2020-05-02 DIAGNOSIS — R278 Other lack of coordination: Secondary | ICD-10-CM | POA: Diagnosis not present

## 2020-05-02 DIAGNOSIS — J431 Panlobular emphysema: Secondary | ICD-10-CM | POA: Diagnosis not present

## 2020-05-02 DIAGNOSIS — I5032 Chronic diastolic (congestive) heart failure: Secondary | ICD-10-CM | POA: Diagnosis not present

## 2020-05-02 DIAGNOSIS — E1142 Type 2 diabetes mellitus with diabetic polyneuropathy: Secondary | ICD-10-CM | POA: Diagnosis not present

## 2020-05-02 DIAGNOSIS — M21372 Foot drop, left foot: Secondary | ICD-10-CM | POA: Diagnosis not present

## 2020-05-03 DIAGNOSIS — J431 Panlobular emphysema: Secondary | ICD-10-CM | POA: Diagnosis not present

## 2020-05-03 DIAGNOSIS — I5032 Chronic diastolic (congestive) heart failure: Secondary | ICD-10-CM | POA: Diagnosis not present

## 2020-05-03 DIAGNOSIS — M21371 Foot drop, right foot: Secondary | ICD-10-CM | POA: Diagnosis not present

## 2020-05-03 DIAGNOSIS — R278 Other lack of coordination: Secondary | ICD-10-CM | POA: Diagnosis not present

## 2020-05-03 DIAGNOSIS — M21372 Foot drop, left foot: Secondary | ICD-10-CM | POA: Diagnosis not present

## 2020-05-03 DIAGNOSIS — E1142 Type 2 diabetes mellitus with diabetic polyneuropathy: Secondary | ICD-10-CM | POA: Diagnosis not present

## 2020-05-04 DIAGNOSIS — E1142 Type 2 diabetes mellitus with diabetic polyneuropathy: Secondary | ICD-10-CM | POA: Diagnosis not present

## 2020-05-04 DIAGNOSIS — J431 Panlobular emphysema: Secondary | ICD-10-CM | POA: Diagnosis not present

## 2020-05-04 DIAGNOSIS — M21372 Foot drop, left foot: Secondary | ICD-10-CM | POA: Diagnosis not present

## 2020-05-04 DIAGNOSIS — R278 Other lack of coordination: Secondary | ICD-10-CM | POA: Diagnosis not present

## 2020-05-04 DIAGNOSIS — M21371 Foot drop, right foot: Secondary | ICD-10-CM | POA: Diagnosis not present

## 2020-05-04 DIAGNOSIS — I5032 Chronic diastolic (congestive) heart failure: Secondary | ICD-10-CM | POA: Diagnosis not present

## 2020-05-05 DIAGNOSIS — E1142 Type 2 diabetes mellitus with diabetic polyneuropathy: Secondary | ICD-10-CM | POA: Diagnosis not present

## 2020-05-05 DIAGNOSIS — M21372 Foot drop, left foot: Secondary | ICD-10-CM | POA: Diagnosis not present

## 2020-05-05 DIAGNOSIS — J431 Panlobular emphysema: Secondary | ICD-10-CM | POA: Diagnosis not present

## 2020-05-05 DIAGNOSIS — M21371 Foot drop, right foot: Secondary | ICD-10-CM | POA: Diagnosis not present

## 2020-05-05 DIAGNOSIS — I5032 Chronic diastolic (congestive) heart failure: Secondary | ICD-10-CM | POA: Diagnosis not present

## 2020-05-05 DIAGNOSIS — R278 Other lack of coordination: Secondary | ICD-10-CM | POA: Diagnosis not present

## 2020-05-06 DIAGNOSIS — E1142 Type 2 diabetes mellitus with diabetic polyneuropathy: Secondary | ICD-10-CM | POA: Diagnosis not present

## 2020-05-06 DIAGNOSIS — M21371 Foot drop, right foot: Secondary | ICD-10-CM | POA: Diagnosis not present

## 2020-05-06 DIAGNOSIS — I5032 Chronic diastolic (congestive) heart failure: Secondary | ICD-10-CM | POA: Diagnosis not present

## 2020-05-06 DIAGNOSIS — M21372 Foot drop, left foot: Secondary | ICD-10-CM | POA: Diagnosis not present

## 2020-05-06 DIAGNOSIS — J431 Panlobular emphysema: Secondary | ICD-10-CM | POA: Diagnosis not present

## 2020-05-06 DIAGNOSIS — R278 Other lack of coordination: Secondary | ICD-10-CM | POA: Diagnosis not present

## 2020-05-11 DIAGNOSIS — R278 Other lack of coordination: Secondary | ICD-10-CM | POA: Diagnosis not present

## 2020-05-11 DIAGNOSIS — M21372 Foot drop, left foot: Secondary | ICD-10-CM | POA: Diagnosis not present

## 2020-05-11 DIAGNOSIS — M21371 Foot drop, right foot: Secondary | ICD-10-CM | POA: Diagnosis not present

## 2020-05-11 DIAGNOSIS — I5032 Chronic diastolic (congestive) heart failure: Secondary | ICD-10-CM | POA: Diagnosis not present

## 2020-05-11 DIAGNOSIS — E1142 Type 2 diabetes mellitus with diabetic polyneuropathy: Secondary | ICD-10-CM | POA: Diagnosis not present

## 2020-05-11 DIAGNOSIS — J431 Panlobular emphysema: Secondary | ICD-10-CM | POA: Diagnosis not present

## 2020-05-12 DIAGNOSIS — E1142 Type 2 diabetes mellitus with diabetic polyneuropathy: Secondary | ICD-10-CM | POA: Diagnosis not present

## 2020-05-12 DIAGNOSIS — I5032 Chronic diastolic (congestive) heart failure: Secondary | ICD-10-CM | POA: Diagnosis not present

## 2020-05-12 DIAGNOSIS — J431 Panlobular emphysema: Secondary | ICD-10-CM | POA: Diagnosis not present

## 2020-05-12 DIAGNOSIS — R278 Other lack of coordination: Secondary | ICD-10-CM | POA: Diagnosis not present

## 2020-05-12 DIAGNOSIS — M21371 Foot drop, right foot: Secondary | ICD-10-CM | POA: Diagnosis not present

## 2020-05-12 DIAGNOSIS — M21372 Foot drop, left foot: Secondary | ICD-10-CM | POA: Diagnosis not present

## 2020-05-13 DIAGNOSIS — M21372 Foot drop, left foot: Secondary | ICD-10-CM | POA: Diagnosis not present

## 2020-05-13 DIAGNOSIS — E1142 Type 2 diabetes mellitus with diabetic polyneuropathy: Secondary | ICD-10-CM | POA: Diagnosis not present

## 2020-05-13 DIAGNOSIS — R278 Other lack of coordination: Secondary | ICD-10-CM | POA: Diagnosis not present

## 2020-05-13 DIAGNOSIS — I5032 Chronic diastolic (congestive) heart failure: Secondary | ICD-10-CM | POA: Diagnosis not present

## 2020-05-13 DIAGNOSIS — M21371 Foot drop, right foot: Secondary | ICD-10-CM | POA: Diagnosis not present

## 2020-05-13 DIAGNOSIS — J431 Panlobular emphysema: Secondary | ICD-10-CM | POA: Diagnosis not present

## 2020-05-14 DIAGNOSIS — R278 Other lack of coordination: Secondary | ICD-10-CM | POA: Diagnosis not present

## 2020-05-14 DIAGNOSIS — J431 Panlobular emphysema: Secondary | ICD-10-CM | POA: Diagnosis not present

## 2020-05-14 DIAGNOSIS — M21371 Foot drop, right foot: Secondary | ICD-10-CM | POA: Diagnosis not present

## 2020-05-14 DIAGNOSIS — I5032 Chronic diastolic (congestive) heart failure: Secondary | ICD-10-CM | POA: Diagnosis not present

## 2020-05-14 DIAGNOSIS — E1142 Type 2 diabetes mellitus with diabetic polyneuropathy: Secondary | ICD-10-CM | POA: Diagnosis not present

## 2020-05-14 DIAGNOSIS — M21372 Foot drop, left foot: Secondary | ICD-10-CM | POA: Diagnosis not present

## 2020-05-17 DIAGNOSIS — I5032 Chronic diastolic (congestive) heart failure: Secondary | ICD-10-CM | POA: Diagnosis not present

## 2020-05-17 DIAGNOSIS — M21371 Foot drop, right foot: Secondary | ICD-10-CM | POA: Diagnosis not present

## 2020-05-17 DIAGNOSIS — M21372 Foot drop, left foot: Secondary | ICD-10-CM | POA: Diagnosis not present

## 2020-05-17 DIAGNOSIS — E1142 Type 2 diabetes mellitus with diabetic polyneuropathy: Secondary | ICD-10-CM | POA: Diagnosis not present

## 2020-05-17 DIAGNOSIS — J431 Panlobular emphysema: Secondary | ICD-10-CM | POA: Diagnosis not present

## 2020-05-17 DIAGNOSIS — R278 Other lack of coordination: Secondary | ICD-10-CM | POA: Diagnosis not present

## 2020-05-18 DIAGNOSIS — R278 Other lack of coordination: Secondary | ICD-10-CM | POA: Diagnosis not present

## 2020-05-18 DIAGNOSIS — J431 Panlobular emphysema: Secondary | ICD-10-CM | POA: Diagnosis not present

## 2020-05-18 DIAGNOSIS — M21371 Foot drop, right foot: Secondary | ICD-10-CM | POA: Diagnosis not present

## 2020-05-18 DIAGNOSIS — I5032 Chronic diastolic (congestive) heart failure: Secondary | ICD-10-CM | POA: Diagnosis not present

## 2020-05-18 DIAGNOSIS — E1142 Type 2 diabetes mellitus with diabetic polyneuropathy: Secondary | ICD-10-CM | POA: Diagnosis not present

## 2020-05-18 DIAGNOSIS — M21372 Foot drop, left foot: Secondary | ICD-10-CM | POA: Diagnosis not present

## 2020-05-19 DIAGNOSIS — M21372 Foot drop, left foot: Secondary | ICD-10-CM | POA: Diagnosis not present

## 2020-05-19 DIAGNOSIS — E1142 Type 2 diabetes mellitus with diabetic polyneuropathy: Secondary | ICD-10-CM | POA: Diagnosis not present

## 2020-05-19 DIAGNOSIS — Z03818 Encounter for observation for suspected exposure to other biological agents ruled out: Secondary | ICD-10-CM | POA: Diagnosis not present

## 2020-05-19 DIAGNOSIS — J431 Panlobular emphysema: Secondary | ICD-10-CM | POA: Diagnosis not present

## 2020-05-19 DIAGNOSIS — R278 Other lack of coordination: Secondary | ICD-10-CM | POA: Diagnosis not present

## 2020-05-19 DIAGNOSIS — I5032 Chronic diastolic (congestive) heart failure: Secondary | ICD-10-CM | POA: Diagnosis not present

## 2020-05-19 DIAGNOSIS — M21371 Foot drop, right foot: Secondary | ICD-10-CM | POA: Diagnosis not present

## 2020-05-20 DIAGNOSIS — M21371 Foot drop, right foot: Secondary | ICD-10-CM | POA: Diagnosis not present

## 2020-05-20 DIAGNOSIS — I5032 Chronic diastolic (congestive) heart failure: Secondary | ICD-10-CM | POA: Diagnosis not present

## 2020-05-20 DIAGNOSIS — E1142 Type 2 diabetes mellitus with diabetic polyneuropathy: Secondary | ICD-10-CM | POA: Diagnosis not present

## 2020-05-20 DIAGNOSIS — R278 Other lack of coordination: Secondary | ICD-10-CM | POA: Diagnosis not present

## 2020-05-20 DIAGNOSIS — M21372 Foot drop, left foot: Secondary | ICD-10-CM | POA: Diagnosis not present

## 2020-05-20 DIAGNOSIS — J431 Panlobular emphysema: Secondary | ICD-10-CM | POA: Diagnosis not present

## 2020-05-21 DIAGNOSIS — M21372 Foot drop, left foot: Secondary | ICD-10-CM | POA: Diagnosis not present

## 2020-05-21 DIAGNOSIS — I5032 Chronic diastolic (congestive) heart failure: Secondary | ICD-10-CM | POA: Diagnosis not present

## 2020-05-21 DIAGNOSIS — M21371 Foot drop, right foot: Secondary | ICD-10-CM | POA: Diagnosis not present

## 2020-05-21 DIAGNOSIS — J431 Panlobular emphysema: Secondary | ICD-10-CM | POA: Diagnosis not present

## 2020-05-21 DIAGNOSIS — E1142 Type 2 diabetes mellitus with diabetic polyneuropathy: Secondary | ICD-10-CM | POA: Diagnosis not present

## 2020-05-21 DIAGNOSIS — R278 Other lack of coordination: Secondary | ICD-10-CM | POA: Diagnosis not present

## 2020-07-09 ENCOUNTER — Ambulatory Visit: Payer: Medicare Other

## 2020-07-13 LAB — CUP PACEART REMOTE DEVICE CHECK
Battery Impedance: 257 Ohm
Battery Remaining Longevity: 126 mo
Battery Voltage: 2.78 V
Brady Statistic AP VP Percent: 10 %
Brady Statistic AP VS Percent: 0 %
Brady Statistic AS VP Percent: 90 %
Brady Statistic AS VS Percent: 0 %
Date Time Interrogation Session: 20220221180231
Implantable Lead Implant Date: 20170727
Implantable Lead Implant Date: 20170727
Implantable Lead Location: 753859
Implantable Lead Location: 753860
Implantable Lead Model: 5076
Implantable Lead Model: 5076
Implantable Pulse Generator Implant Date: 20170727
Lead Channel Impedance Value: 536 Ohm
Lead Channel Impedance Value: 698 Ohm
Lead Channel Pacing Threshold Amplitude: 0.375 V
Lead Channel Pacing Threshold Amplitude: 0.625 V
Lead Channel Pacing Threshold Pulse Width: 0.4 ms
Lead Channel Pacing Threshold Pulse Width: 0.4 ms
Lead Channel Setting Pacing Amplitude: 1.5 V
Lead Channel Setting Pacing Amplitude: 2 V
Lead Channel Setting Pacing Pulse Width: 0.4 ms
Lead Channel Setting Sensing Sensitivity: 4 mV

## 2020-12-31 IMAGING — CT CT ANGIO CHEST-ABD-PELV FOR DISSECTION W/ AND WO/W CM
2 of 8 series · 10 of 46 positions shown, 15 images · IV contrast (iopamidol)
Comparison: Abdominal CT 07/07/2018

CLINICAL DATA: Abdominal pain.

EXAM:
CT ANGIOGRAPHY CHEST, ABDOMEN AND PELVIS
TECHNIQUE: Multidetector CT imaging through the chest, abdomen and pelvis was
performed using the standard protocol during bolus administration of
intravenous contrast. Multiplanar reconstructed images and MIPs were
obtained and reviewed to evaluate the vascular anatomy.
CONTRAST:  80mL 8FKLYR-5NX IOPAMIDOL (8FKLYR-5NX) INJECTION 76%

[Series 9: axial arterial · axial · arterial · 0.85mm/px · z∈[-514,-82]mm · 7 of 192 slices shown, 12 images]
[im 24/192  soft-tissue]
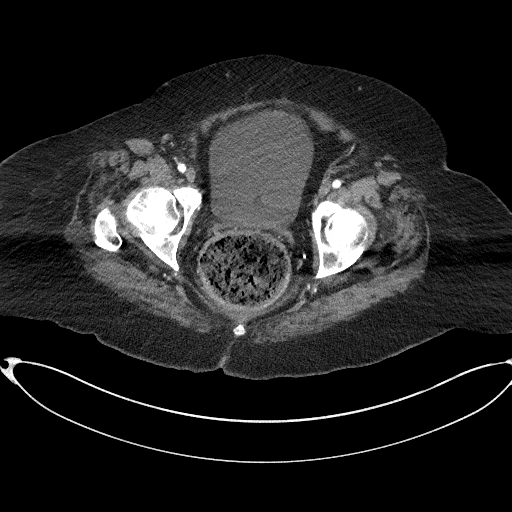
[im 24/192  bone]
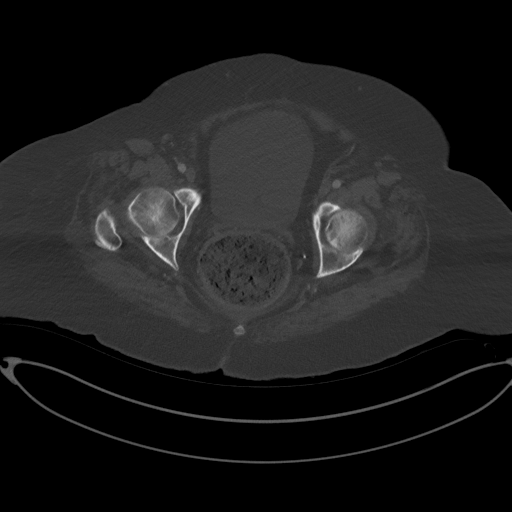
[im 48/192  soft-tissue]
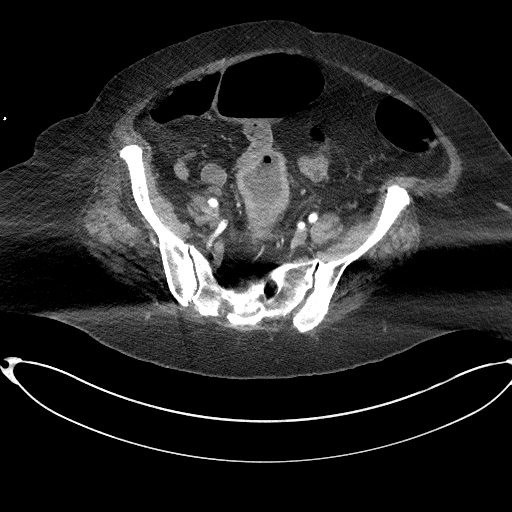
[im 72/192  soft-tissue]
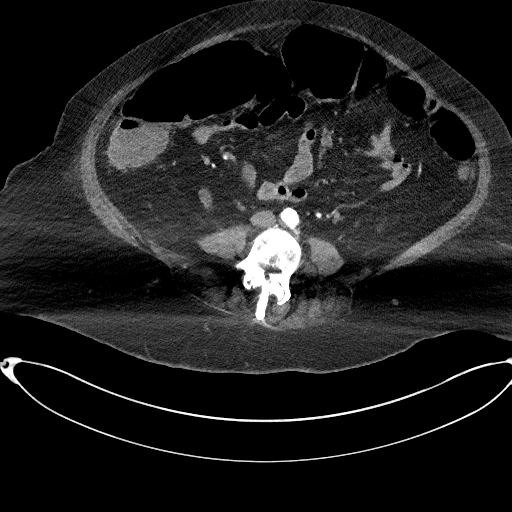
[im 96/192  soft-tissue]
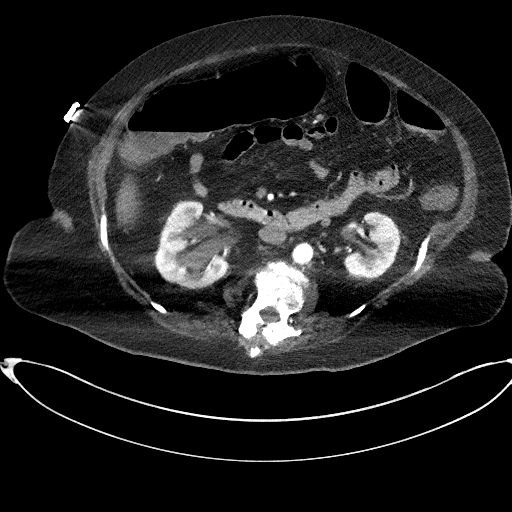
[im 96/192  lung]
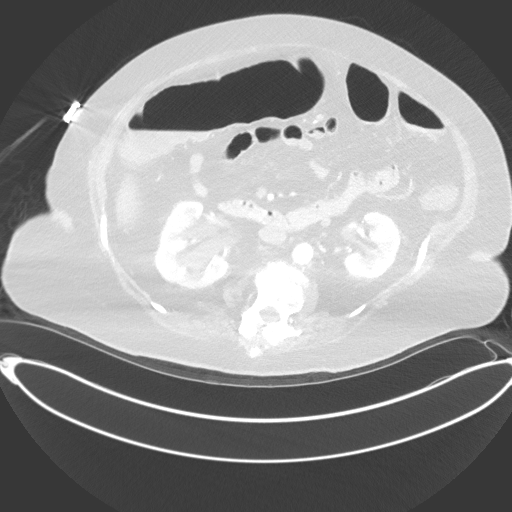
[im 120/192  soft-tissue]
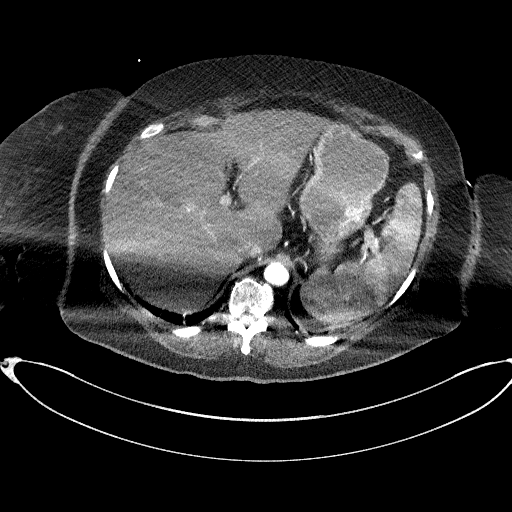
[im 120/192  lung]
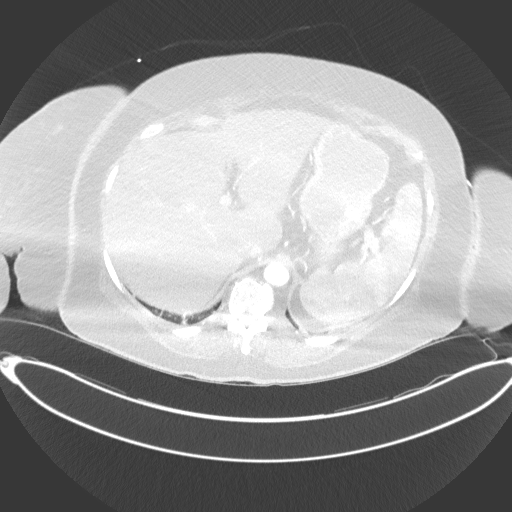
[im 144/192  soft-tissue]
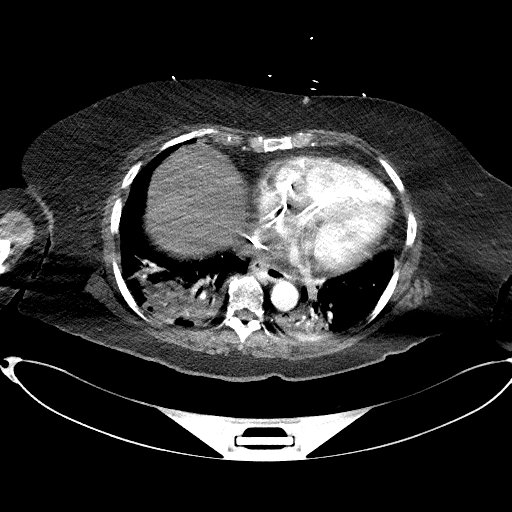
[im 144/192  lung]
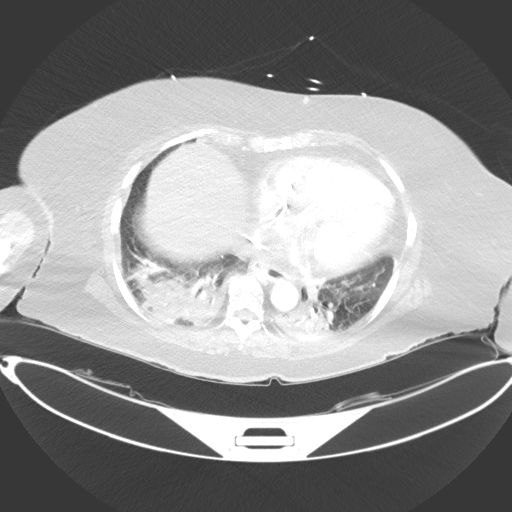
[im 168/192  soft-tissue]
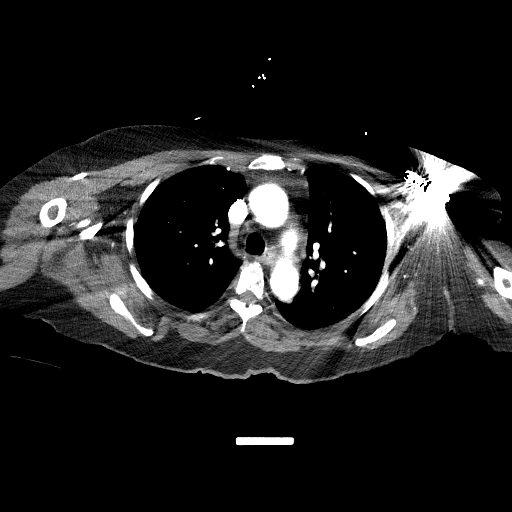
[im 168/192  lung]
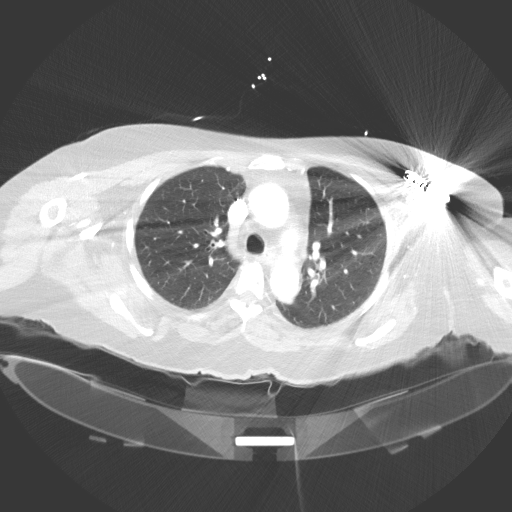

[Series 12: coronals · coronal · 0.80mm/px · 3 of 156 slices shown]
[im 39/156  soft-tissue]
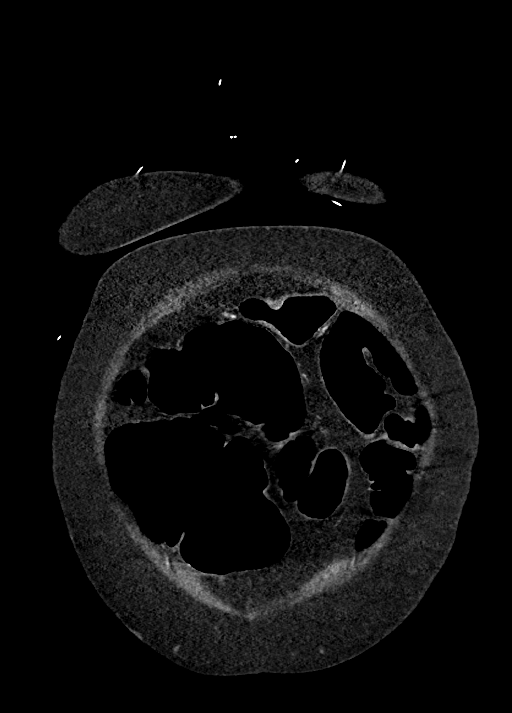
[im 78/156  soft-tissue]
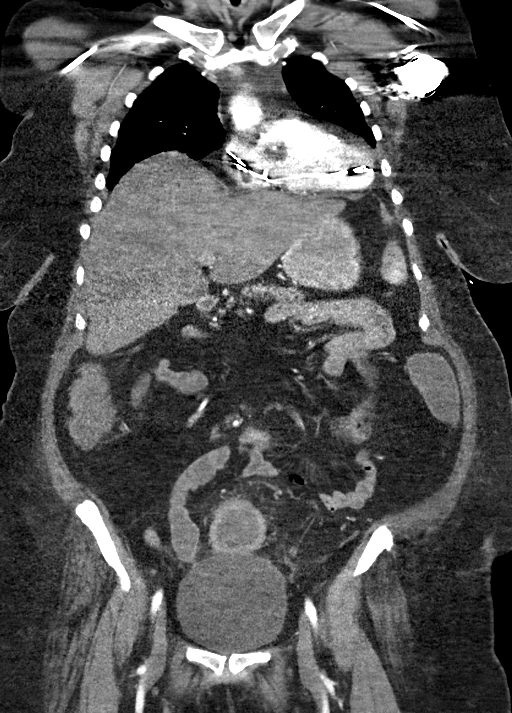
[im 117/156  soft-tissue]
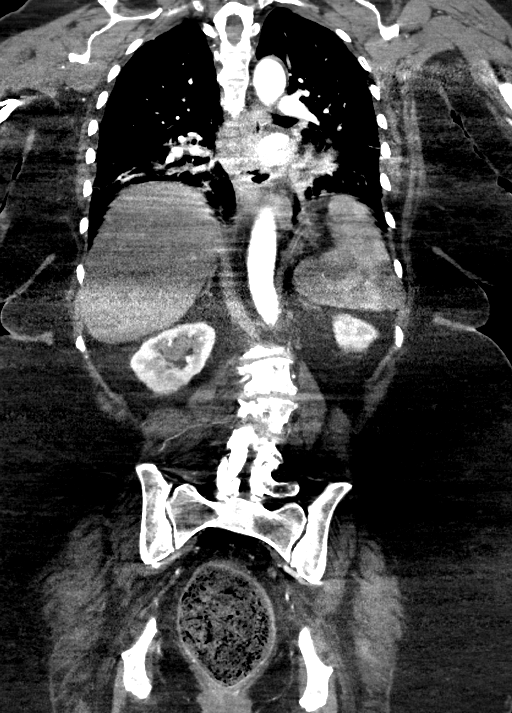

[10 of 46 positions shown; findings below may reference images not displayed]

FINDINGS: CTA CHEST FINDINGS

Cardiovascular: There is a nonocclusive right pulmonary embolus
within the lobar branch of the right upper lobe, extending to the
segmental branches. There is a possible smaller nonocclusive
pulmonary embolus in a basilar segmental branch in the left lower
lobe. There is mild right heart strain with RV/LV ratio of 1.1. Mild
atherosclerotic disease of the aorta with no evidence of dissection
or aneurysmal dilation.

Mediastinum/Nodes: No enlarged mediastinal, hilar, or axillary lymph
nodes. Thyroid gland, trachea, and esophagus demonstrate no
significant findings. Small hiatal hernia.

Lungs/Pleura: Bilateral lower lobe airspace consolidation versus
atelectasis.

Musculoskeletal: No chest wall abnormality. No acute or significant
osseous findings.

Review of the MIP images confirms the above findings.

CTA ABDOMEN AND PELVIS FINDINGS

VASCULAR

Aorta: Normal caliber aorta without aneurysm, dissection, vasculitis
or significant stenosis.

Celiac: Patent without evidence of aneurysm, dissection, vasculitis
or significant stenosis.

SMA: Patent without evidence of aneurysm, dissection, vasculitis or
significant stenosis.

Renals: Both renal arteries are patent without evidence of aneurysm,
dissection, vasculitis, fibromuscular dysplasia or significant
stenosis.

IMA: Patent without evidence of aneurysm, dissection, vasculitis or
significant stenosis.

Inflow: Patent without evidence of aneurysm, dissection, vasculitis
or significant stenosis.

Veins: No obvious venous abnormality within the limitations of this
arterial phase study.

Review of the MIP images confirms the above findings.

NON-VASCULAR

Hepatobiliary: Hepatic steatosis. Post cholecystectomy.

Pancreas: Unremarkable. No pancreatic ductal dilatation or
surrounding inflammatory changes.

Spleen: Normal in size without focal abnormality.

Adrenals/Urinary Tract: Normal adrenal glands. Normal appearance of
the left kidney/left ureter and urinary bladder with Foley catheter
in place. There is a right hydronephrosis and hydroureter to the
level of the distal third of the right ureter. No obstructive
calculus or obvious ureteral mass.

Stomach/Bowel: Stomach is within normal limits. No evidence of
appendicitis. No evidence of bowel wall thickening, distention, or
inflammatory changes. Relatively fluid contents of the colon, may be
associated with a diarrheal state. Nonspecific gaseous distension of
the colon. Scattered colonic diverticula.

Lymphatic: Aortic atherosclerosis. No enlarged abdominal or pelvic
lymph nodes.

Reproductive: Status post hysterectomy. No adnexal masses.

Other: No abdominal wall hernia or abnormality. No abdominopelvic
ascites.

Musculoskeletal: Scoliosis of the lumbosacral spine. Multilevel
osteoarthritic changes. Sclerosis and erosive endplate changes at
L2-L3 centered around the disc space. These changes are out of
proportion to the degenerative disc disease seen at the remaining
levels of the spine.

Review of the MIP images confirms the above findings.
IMPRESSION: 1. Nonocclusive right pulmonary embolus within the lobar branch of
the right upper lobe, extending to the segmental branches. Possible
smaller nonocclusive pulmonary embolus in a basilar segmental branch
of the left lower lobe.
2. Mild right heart strain with RV/LV ratio of 1.
3. Bilateral lower lobe airspace consolidation versus atelectasis.
4. Hepatic steatosis.
5. Right hydronephrosis and hydroureter to the level of the distal
third of the right ureter. No obstructive calculus or obvious
ureteral mass.
6. Sclerosis and erosive endplate changes at L2-L3 centered around
the disc space. These changes are out of proportion to the
osteoarthritic changes at the remaining levels of the spine.
Findings may represent degenerative discogenic changes, however
discitis/osteomyelitis may have a similar appearance.
7. Nonspecific gaseous distension of the colon and probable
diarrheal state.

These results were called by telephone at the time of interpretation
on 07/12/2018 at [DATE] to Dr. CHUSO KARLSSON , who verbally
acknowledged these results.

## 2021-09-26 ENCOUNTER — Other Ambulatory Visit: Payer: Self-pay

## 2021-09-26 DIAGNOSIS — I509 Heart failure, unspecified: Secondary | ICD-10-CM | POA: Insufficient documentation

## 2021-09-26 DIAGNOSIS — F419 Anxiety disorder, unspecified: Secondary | ICD-10-CM | POA: Insufficient documentation

## 2021-09-26 DIAGNOSIS — D649 Anemia, unspecified: Secondary | ICD-10-CM | POA: Insufficient documentation

## 2021-09-26 DIAGNOSIS — E871 Hypo-osmolality and hyponatremia: Secondary | ICD-10-CM | POA: Insufficient documentation

## 2021-09-26 DIAGNOSIS — E1142 Type 2 diabetes mellitus with diabetic polyneuropathy: Secondary | ICD-10-CM | POA: Insufficient documentation

## 2021-09-26 DIAGNOSIS — I5032 Chronic diastolic (congestive) heart failure: Secondary | ICD-10-CM | POA: Insufficient documentation

## 2021-09-26 DIAGNOSIS — K5900 Constipation, unspecified: Secondary | ICD-10-CM | POA: Insufficient documentation

## 2021-09-26 DIAGNOSIS — I10 Essential (primary) hypertension: Secondary | ICD-10-CM | POA: Insufficient documentation

## 2021-09-26 DIAGNOSIS — Z86711 Personal history of pulmonary embolism: Secondary | ICD-10-CM | POA: Insufficient documentation

## 2021-09-26 DIAGNOSIS — E039 Hypothyroidism, unspecified: Secondary | ICD-10-CM | POA: Insufficient documentation

## 2021-09-26 DIAGNOSIS — F0390 Unspecified dementia without behavioral disturbance: Secondary | ICD-10-CM | POA: Insufficient documentation

## 2021-09-26 DIAGNOSIS — R41841 Cognitive communication deficit: Secondary | ICD-10-CM | POA: Insufficient documentation

## 2021-09-26 DIAGNOSIS — Z950 Presence of cardiac pacemaker: Secondary | ICD-10-CM | POA: Insufficient documentation

## 2021-09-26 DIAGNOSIS — J449 Chronic obstructive pulmonary disease, unspecified: Secondary | ICD-10-CM | POA: Insufficient documentation

## 2021-09-26 DIAGNOSIS — K219 Gastro-esophageal reflux disease without esophagitis: Secondary | ICD-10-CM | POA: Insufficient documentation

## 2021-09-26 DIAGNOSIS — J309 Allergic rhinitis, unspecified: Secondary | ICD-10-CM | POA: Insufficient documentation

## 2021-09-26 DIAGNOSIS — E119 Type 2 diabetes mellitus without complications: Secondary | ICD-10-CM | POA: Insufficient documentation

## 2021-09-26 HISTORY — DX: Presence of cardiac pacemaker: Z95.0

## 2021-10-27 ENCOUNTER — Ambulatory Visit: Payer: Medicare Other | Admitting: Cardiology

## 2021-12-01 ENCOUNTER — Ambulatory Visit: Payer: Medicare Other | Admitting: Cardiology

## 2022-01-05 ENCOUNTER — Ambulatory Visit: Payer: Medicare Other | Admitting: Cardiology

## 2022-02-08 ENCOUNTER — Encounter: Payer: Self-pay | Admitting: Cardiology

## 2022-02-08 ENCOUNTER — Ambulatory Visit: Payer: Medicare Other | Attending: Cardiology | Admitting: Cardiology

## 2022-02-08 VITALS — BP 130/80 | HR 76 | Resp 20 | Ht 63.0 in

## 2022-02-08 DIAGNOSIS — I5030 Unspecified diastolic (congestive) heart failure: Secondary | ICD-10-CM

## 2022-02-08 DIAGNOSIS — I442 Atrioventricular block, complete: Secondary | ICD-10-CM

## 2022-02-08 DIAGNOSIS — Z950 Presence of cardiac pacemaker: Secondary | ICD-10-CM

## 2022-02-08 NOTE — Progress Notes (Signed)
Cardiology Office Note:    Date:  02/08/2022   ID:  Danielle Dennis, DOB 17-Apr-1934, MRN [id number]  PCP:  Bonnita Nasuti, MD  Cardiologist:  Jenean Lindau, MD   Referring MD: Nelle Don, PA-C    ASSESSMENT:    1. Complete AV block (Danielle Dennis)   2. Heart failure with preserved ejection fraction, unspecified HF chronicity (Danielle Dennis)   3. Presence of cardiac pacemaker   4. Presence of permanent cardiac pacemaker    PLAN:    In order of problems listed above:  Primary prevention stressed with the patient.  Importance of compliance with diet and medication stressed and she vocalized understanding. Preserved ejection congestive heart failure: Stable and negative for education for this.  Diet and weight checks were discussed. History of complete heart block post pacemaker insertion: Patient has been seen and followed by Dr. Curt Bears in the past.  We will do a referral. Cardiac murmur: Echocardiogram will be done to assess murmur heard on auscultation. Anticoagulation: Unclear reason for anticoagulation.  Hopefully pacemaker evaluation by electrophysiology colleagues will help assess this.  I reviewed previous notes especially from the recent past and I could not get any idea of the reason for this. Anemia: Managed by primary care.  Appears significant.  Caution must be exercised and monitoring her closely because of the issue of anticoagulation. Patient be seen in follow-up appointment on an annual basis or earlier if she has any concerns.   Medication Adjustments/Labs and Tests Ordered: Current medicines are reviewed at length with the patient today.  Concerns regarding medicines are outlined above.  Orders Placed This Encounter  Procedures   Ambulatory referral to Cardiac Electrophysiology   EKG 12-Lead   ECHOCARDIOGRAM COMPLETE   No orders of the defined types were placed in this encounter.    Chief Complaint  Patient presents with   Follow-up     History of Present Illness:     Danielle Dennis is a 88 y.o. female.  Patient has past medical history preserved ejection fraction congestive heart failure, essential hypertension, complete AV block for which she has a pacemaker.  She is on anticoagulation and I am not clear about the reason for this.  I reviewed the notes.  No chest pain orthopnea or PND.  She has history of dementia.  She is not a great historian.  She has significant anemia.  At the time of my evaluation, the patient is alert awake oriented and in no distress.  She is brought in a wheelchair.  Past Medical History:  Diagnosis Date   (HFpEF) heart failure with preserved ejection fraction (Danielle Dennis) 07/07/2018   Allergic rhinitis    Anemia    Anxiety    Atypical chest pain 01/05/2016   AV block 01/12/2017   Bradycardia 12/30/2015   CHF (congestive heart failure) (Danielle Dennis)    Chronic diastolic (congestive) heart failure (Danielle Dennis)    Cognitive communication deficit    Complete AV block (Danielle Dennis) 01/12/2017   Constipation    COPD (chronic obstructive pulmonary disease) (Danielle Dennis)    DDD (degenerative disc disease), lumbar 12/31/2014   Dementia (Danielle Dennis)    Diabetes mellitus without complication (Danielle Dennis)    Discitis of lumbar region    GERD (gastroesophageal reflux disease)    GI bleed 07/07/2018   Hypertension    Hypo-osmolality and hyponatremia    Hypothyroidism    LBBB (left bundle branch block) 11/14/2015   Lumbar radiculopathy 12/31/2014   Malaise and fatigue 01/12/2017   Personal history of pulmonary embolism  Presence of cardiac pacemaker 09/26/2021   Presence of permanent cardiac pacemaker    Primary osteoarthritis of right hip 01/28/2015   Shortness of breath 01/12/2017   Swelling 11/14/2015   T2DM (type 2 diabetes mellitus) (Danielle Dennis) 07/07/2018   Tricuspid valve regurgitation 11/02/2017   Type 2 diabetes mellitus with diabetic polyneuropathy (Danielle Dennis)    UTI due to extended-spectrum beta lactamase (ESBL) producing Escherichia coli 07/07/2018    Past Surgical History:  Procedure  Laterality Date   REPLACEMENT TOTAL KNEE BILATERAL      Current Medications: Current Meds  Medication Sig   albuterol (VENTOLIN HFA) 108 (90 Base) MCG/ACT inhaler Inhale 2 puffs into the lungs every 6 (six) hours as needed for wheezing or shortness of breath.   aspirin 81 MG chewable tablet Chew 81 mg by mouth daily.   atorvastatin (LIPITOR) 10 MG tablet Take 10 mg by mouth daily.   ELIQUIS 2.5 MG TABS tablet Take 2.5 mg by mouth 2 (two) times daily.   Ferrous Sulfate (IRON PO) Take 324 mg by mouth daily.   fluticasone (FLONASE) 50 MCG/ACT nasal spray Place 1 spray into both nostrils daily.   fluticasone furoate-vilanterol (BREO ELLIPTA) 100-25 MCG/INH AEPB Inhale 1 puff into the lungs daily.   gabapentin (NEURONTIN) 100 MG capsule Take 100 mg by mouth 3 (three) times daily.   hydrALAZINE (APRESOLINE) 50 MG tablet Take 50 mg by mouth 4 (four) times daily.   INCRUSE ELLIPTA 62.5 MCG/ACT AEPB Inhale 1 puff into the lungs daily.   Infant Care Products [facility]) OINT Apply 1 application. topically daily as needed for incontinence.   isosorbide mononitrate (IMDUR) 120 MG 24 hr tablet Take 120 mg by mouth daily.   lactulose (CHRONULAC) 10 GM/15ML solution Take 15 mLs by mouth daily.   levothyroxine (SYNTHROID) 100 MCG tablet Take 100 mcg by mouth daily.   magnesium oxide (MAG-OX) 400 MG tablet Take 400 mg by mouth daily.   Metoprolol Succinate 25 MG CS24 Take 1 capsule by mouth daily.   pantoprazole (PROTONIX) 40 MG tablet Take 40 mg by mouth daily.   polyethylene glycol (MIRALAX / GLYCOLAX) 17 g packet Take 17 g by mouth daily.   polyvinyl alcohol (LIQUIFILM TEARS) 1.4 % ophthalmic solution Place 1 drop into both eyes at bedtime.   potassium chloride SA (K-DUR,KLOR-CON) 20 MEQ tablet Take 40 mEq by mouth 2 (two) times daily.      Allergies:   Codeine   Social History   Socioeconomic History   Marital status: Widowed    Spouse name: Not on file   Number of children: Not on file    Years of education: Not on file   Highest education level: Not on file  Occupational History   Not on file  Tobacco Use   Smoking status: Never   Smokeless tobacco: Never  Vaping Use   Vaping Use: Never used  Substance and Sexual Activity   Alcohol use: Not Currently   Drug use: Not Currently   Sexual activity: Not Currently  Other Topics Concern   Not on file  Social History Narrative   Not on file   Social Determinants of Health   Financial Resource Strain: Not on file  Food Insecurity: No Food Insecurity (07/07/2018)   Hunger Vital Sign    Worried About Running Out of Food in the Last Year: Never true    Ran Out of Food in the Last Year: Never true  Transportation Needs: No Transportation Needs (07/07/2018)   [place] - Transportation  Lack of Transportation (Medical): No    Lack of Transportation (Non-Medical): No  Physical Activity: Not on file  Stress: Not on file  Social Connections: Not on file     Family History: The patient's family history includes Heart Problems in her brother.  ROS:   Please see the history of present illness.    All other systems reviewed and are negative.  EKGs/Labs/Other Studies Reviewed:    The following studies were reviewed today: EKG revealed paced rhythm   Recent Labs: No results found for requested labs within last 365 days.  Recent Lipid Panel No results found for: "CHOL", "TRIG", "HDL", "CHOLHDL", "VLDL", "LDLCALC", "LDLDIRECT"  Physical Exam:    VS:  BP 130/80 (BP Location: Left Arm, Patient Position: Sitting, Cuff Size: Normal)   Pulse 76   Resp 20   Ht '5\' 3"'$  (1.6 m)   SpO2 90%   BMI 49.25 kg/m     Wt Readings from Last 3 Encounters:  01/05/20 278 lb (126.1 kg)  07/19/18 209 lb 14.1 oz (95.2 kg)     GEN: Patient is in no acute distress HEENT: Normal NECK: No JVD; No carotid bruits LYMPHATICS: No lymphadenopathy CARDIAC: Hear sounds regular, 2/6 systolic murmur at the apex. RESPIRATORY:  Clear to  auscultation without rales, wheezing or rhonchi  ABDOMEN: Soft, non-tender, non-distended MUSCULOSKELETAL:  No edema; No deformity  SKIN: Warm and dry NEUROLOGIC:  Alert and oriented x 3 PSYCHIATRIC:  Normal affect   Signed, Jenean Lindau, MD  02/08/2022 3:22 PM    [facility]

## 2022-02-08 NOTE — Patient Instructions (Signed)
Medication Instructions:  Your physician recommends that you continue on your current medications as directed. Please refer to the Current Medication list given to you today.  *If you need a refill on your cardiac medications before your next appointment, please call your pharmacy*   Lab Work: None ordered If you have labs (blood work) drawn today and your tests are completely normal, you will receive your results only by: MyChart Message (if you have MyChart) OR A paper copy in the mail If you have any lab test that is abnormal or we need to change your treatment, we will call you to review the results.   Testing/Procedures: Your physician has requested that you have an echocardiogram. Echocardiography is a painless test that uses sound waves to create images of your heart. It provides your doctor with information about the size and shape of your heart and how well your heart's chambers and valves are working. This procedure takes approximately one hour. There are no restrictions for this procedure.    Follow-Up: At [facility], you and your health needs are our priority.  As part of our continuing mission to provide you with exceptional heart care, we have created designated Provider Care Teams.  These Care Teams include your primary Cardiologist (physician) and Advanced Practice Providers (APPs -  Physician Assistants and Nurse Practitioners) who all work together to provide you with the care you need, when you need it.  We recommend signing up for the patient portal called "MyChart".  Sign up information is provided on this After Visit Summary.  MyChart is used to connect with patients for Virtual Visits (Telemedicine).  Patients are able to view lab/test results, encounter notes, upcoming appointments, etc.  Non-urgent messages can be sent to your provider as well.   To learn more about what you can do with MyChart, go to [email or web address].    Your next appointment:   12  month(s)  The format for your next appointment:   In Person  Provider:   Rajan Revankar, MD   Other Instructions Echocardiogram An echocardiogram is a test that uses sound waves (ultrasound) to produce images of the heart. Images from an echocardiogram can provide important information about: Heart size and shape. The size and thickness and movement of your heart's walls. Heart muscle function and strength. Heart valve function or if you have stenosis. Stenosis is when the heart valves are too narrow. If blood is flowing backward through the heart valves (regurgitation). A tumor or infectious growth around the heart valves. Areas of heart muscle that are not working well because of poor blood flow or injury from a heart attack. Aneurysm detection. An aneurysm is a weak or damaged part of an artery wall. The wall bulges out from the normal force of blood pumping through the body. Tell a health care provider about: Any allergies you have. All medicines you are taking, including vitamins, herbs, eye drops, creams, and over-the-counter medicines. Any blood disorders you have. Any surgeries you have had. Any medical conditions you have. Whether you are pregnant or may be pregnant. What are the risks? Generally, this is a safe test. However, problems may occur, including an allergic reaction to dye (contrast) that may be used during the test. What happens before the test? No specific preparation is needed. You may eat and drink normally. What happens during the test? You will take off your clothes from the waist up and put on a hospital gown. Electrodes or electrocardiogram (ECG)patches may be placed on   your chest. The electrodes or patches are then connected to a device that monitors your heart rate and rhythm. You will lie down on a table for an ultrasound exam. A gel will be applied to your chest to help sound waves pass through your skin. A handheld device, called a transducer, will  be pressed against your chest and moved over your heart. The transducer produces sound waves that travel to your heart and bounce back (or "echo" back) to the transducer. These sound waves will be captured in real-time and changed into images of your heart that can be viewed on a video monitor. The images will be recorded on a computer and reviewed by your health care provider. You may be asked to change positions or hold your breath for a short time. This makes it easier to get different views or better views of your heart. In some cases, you may receive contrast through an IV in one of your veins. This can improve the quality of the pictures from your heart. The procedure may vary among health care providers and hospitals.   What can I expect after the test? You may return to your normal, everyday life, including diet, activities, and medicines, unless your health care provider tells you not to do that. Follow these instructions at home: It is up to you to get the results of your test. Ask your health care provider, or the department that is doing the test, when your results will be ready. Keep all follow-up visits. This is important. Summary An echocardiogram is a test that uses sound waves (ultrasound) to produce images of the heart. Images from an echocardiogram can provide important information about the size and shape of your heart, heart muscle function, heart valve function, and other possible heart problems. You do not need to do anything to prepare before this test. You may eat and drink normally. After the echocardiogram is completed, you may return to your normal, everyday life, unless your health care provider tells you not to do that. This information is not intended to replace advice given to you by your health care provider. Make sure you discuss any questions you have with your health care provider. Document Revised: 12/30/2019 Document Reviewed: 12/30/2019 Elsevier Patient  Education  2021 [employer]   

## 2022-02-17 ENCOUNTER — Ambulatory Visit (HOSPITAL_COMMUNITY): Payer: Medicare Other | Attending: Cardiology

## 2022-02-17 DIAGNOSIS — I5030 Unspecified diastolic (congestive) heart failure: Secondary | ICD-10-CM | POA: Insufficient documentation

## 2022-02-20 LAB — ECHOCARDIOGRAM COMPLETE
Area-P 1/2: 3.48 cm2
S' Lateral: 2.3 cm

## 2022-03-02 ENCOUNTER — Telehealth: Payer: Self-pay | Admitting: Cardiology

## 2022-03-02 NOTE — Telephone Encounter (Signed)
Caller called to report patient is bed-ridden and is at [facility] and [facility] in [place].

## 2022-03-03 NOTE — Telephone Encounter (Signed)
Left message for patient's daughter (DPR) to call back 

## 2022-03-06 NOTE — Telephone Encounter (Signed)
Results reviewed with Joy per DPR as per Dr. Julien Nordmann note.  Joy verbalized understanding and had no additional questions. Routed to PCP.

## 2022-03-06 NOTE — Telephone Encounter (Signed)
Left VM for Joy to callback. Results have been faxed to [facility].  Echo results. Trivial mitral valve regurgitation. There is mild thickening of the aortic valve.

## 2022-03-20 ENCOUNTER — Ambulatory Visit: Payer: Medicare Other | Attending: Cardiology | Admitting: Cardiology

## 2022-06-22 ENCOUNTER — Encounter: Payer: Medicare Other | Admitting: Cardiology

## 2022-12-21 DEATH — deceased
